# Patient Record
Sex: Male | Born: 1945 | Race: White | Hispanic: No | Marital: Married | Smoking: Former smoker
Health system: Southern US, Community
[De-identification: ages and names within clinical notes are randomized; demographics above are authoritative.]

## PROBLEM LIST (undated history)

## (undated) DIAGNOSIS — R51 Headache: Secondary | ICD-10-CM

## (undated) DIAGNOSIS — J189 Pneumonia, unspecified organism: Secondary | ICD-10-CM

## (undated) DIAGNOSIS — I10 Essential (primary) hypertension: Secondary | ICD-10-CM

## (undated) DIAGNOSIS — R0683 Snoring: Principal | ICD-10-CM

## (undated) DIAGNOSIS — M1990 Unspecified osteoarthritis, unspecified site: Secondary | ICD-10-CM

## (undated) DIAGNOSIS — K219 Gastro-esophageal reflux disease without esophagitis: Secondary | ICD-10-CM

## (undated) DIAGNOSIS — M549 Dorsalgia, unspecified: Secondary | ICD-10-CM

## (undated) DIAGNOSIS — R52 Pain, unspecified: Secondary | ICD-10-CM

## (undated) DIAGNOSIS — R519 Headache, unspecified: Secondary | ICD-10-CM

## (undated) DIAGNOSIS — I4891 Unspecified atrial fibrillation: Secondary | ICD-10-CM

## (undated) DIAGNOSIS — L409 Psoriasis, unspecified: Secondary | ICD-10-CM

## (undated) DIAGNOSIS — R0600 Dyspnea, unspecified: Secondary | ICD-10-CM

## (undated) DIAGNOSIS — I499 Cardiac arrhythmia, unspecified: Secondary | ICD-10-CM

## (undated) HISTORY — PX: CATARACT EXTRACTION: SUR2

## (undated) HISTORY — PX: TONSILLECTOMY: SUR1361

## (undated) HISTORY — DX: Dorsalgia, unspecified: M54.9

## (undated) HISTORY — DX: Snoring: R06.83

---

## 2006-07-06 HISTORY — PX: OTHER SURGICAL HISTORY: SHX169

## 2006-08-02 ENCOUNTER — Ambulatory Visit (HOSPITAL_COMMUNITY): Admission: RE | Admit: 2006-08-02 | Discharge: 2006-08-02 | Payer: Self-pay | Admitting: General Surgery

## 2006-08-02 ENCOUNTER — Encounter (INDEPENDENT_AMBULATORY_CARE_PROVIDER_SITE_OTHER): Payer: Self-pay | Admitting: Specialist

## 2007-08-15 ENCOUNTER — Encounter: Admission: RE | Admit: 2007-08-15 | Discharge: 2007-08-15 | Payer: Self-pay | Admitting: Internal Medicine

## 2010-11-21 NOTE — Op Note (Signed)
NAMEKOURY, RODDY              ACCOUNT NO.:  [id number]   MEDICAL RECORD NO.:  [id number]          PATIENT TYPE:  AMB   LOCATION:  SDS                          FACILITY:  [facility]   PHYSICIAN:  Angelia Mould. Derrell Lolling, M.D.DATE OF BIRTH:  04/10/1946   DATE OF PROCEDURE:  08/02/2006  DATE OF DISCHARGE:                               OPERATIVE REPORT   PREOPERATIVE DIAGNOSIS:  Right breast mass.   POSTOPERATIVE DIAGNOSIS:  Right breast mass.   OPERATION PERFORMED:  Excisional biopsy of right breast mass.   SURGEON:  Angelia Mould. Derrell Lolling, M.D.   ESTIMATED BLOOD LOSS:  About 10 mL.   COMPLICATIONS:  None.   COUNTS:  Sponge, needle and instrument counts were correct.   OPERATIVE INDICATIONS:  This is a 65 year old white man, who recently  noticed some pain above his right nipple and felt a lump.  There have  never been trauma or an infection or a problem there before.  Ultrasound  of this area was performed at [facility] and suggested an 8  x 16-mm lipoma.  On exam, he has got 1 small 1.5-cm soft tissue mass  just outside the areolar margin of the right breast at the 11-o'clock  position.  There are no adenopathy or skin change.  He is brought to the  minor procedure area for excision.   OPERATIVE TECHNIQUE:  The patient was brought to the minor procedure at  the [facility] Short Stay center.  He as placed supine on the operating table.  Time-out was performed to confirm the correct patient, correct procedure  and correct site.  The right breast was then prepped and draped in a  sterile fashion.  Xylocaine 1% with epinephrine was used as a local  infiltration anesthetic.  A curved circumareolar incision was made in  the superolateral aspect just outside the areolar margin.  Dissection  was carried down into the subcutaneous tissue and the breast tissue,  where I incised a firm nodule, which was smooth and was consistent with  a cyst or a fibrotic lipoma.  This was sent for routine  histology.  Hemostasis was excellent was achieved with Surgicel gauze and pressure.  I removed the Surgicel gauze after 5 minutes.  I closed the deeper  subcutaneous tissue with interrupted sutures of 3-0 Vicryl, and the skin  was closed with a running, subcuticular suture of 4-0 Monocryl and Steri-  Strips.  Clean bandages were placed.  The patient was taken back to the  waiting room in excellent condition.  Estimated blood loss was about 10  mL.     Angelia Mould. Derrell Lolling, M.D.  Electronically Signed    HMI/MEDQ  D:  08/02/2006  T:  08/02/2006  Job:  [id number]   cc:   Areatha Keas, M.D.

## 2011-01-27 ENCOUNTER — Other Ambulatory Visit: Payer: Self-pay | Admitting: Physician Assistant

## 2012-07-14 ENCOUNTER — Other Ambulatory Visit: Payer: Self-pay | Admitting: Orthopedic Surgery

## 2012-07-14 DIAGNOSIS — M25619 Stiffness of unspecified shoulder, not elsewhere classified: Secondary | ICD-10-CM

## 2012-07-14 DIAGNOSIS — M25512 Pain in left shoulder: Secondary | ICD-10-CM

## 2012-07-20 ENCOUNTER — Other Ambulatory Visit: Payer: Self-pay

## 2012-07-27 ENCOUNTER — Other Ambulatory Visit: Payer: Self-pay

## 2013-01-28 ENCOUNTER — Ambulatory Visit
Admission: RE | Admit: 2013-01-28 | Discharge: 2013-01-28 | Disposition: A | Discharge status: Home / Self Care | Payer: 59 | Source: Ambulatory Visit | Attending: Orthopedic Surgery | Admitting: Orthopedic Surgery

## 2013-01-28 DIAGNOSIS — M25619 Stiffness of unspecified shoulder, not elsewhere classified: Secondary | ICD-10-CM

## 2013-01-28 DIAGNOSIS — M25512 Pain in left shoulder: Secondary | ICD-10-CM

## 2013-02-01 ENCOUNTER — Other Ambulatory Visit: Payer: Self-pay

## 2013-06-06 DIAGNOSIS — M47817 Spondylosis without myelopathy or radiculopathy, lumbosacral region: Secondary | ICD-10-CM | POA: Insufficient documentation

## 2014-01-15 ENCOUNTER — Encounter (HOSPITAL_COMMUNITY): Payer: Self-pay | Admitting: Pharmacy Technician

## 2014-01-15 NOTE — Progress Notes (Signed)
Arlee Muslim, PA  -  Please enter preop orders in Epic for Ean Gettel - he is coming to [facility] on 7/23 for preop.  Thanks.

## 2014-01-18 ENCOUNTER — Other Ambulatory Visit: Payer: Self-pay | Admitting: Orthopedic Surgery

## 2014-01-25 ENCOUNTER — Encounter (HOSPITAL_COMMUNITY): Payer: Self-pay

## 2014-01-25 ENCOUNTER — Ambulatory Visit (HOSPITAL_COMMUNITY)
Admission: RE | Admit: 2014-01-25 | Discharge: 2014-01-25 | Disposition: A | Discharge status: Home / Self Care | Payer: Worker's Compensation | Source: Ambulatory Visit | Attending: Anesthesiology | Admitting: Anesthesiology

## 2014-01-25 ENCOUNTER — Encounter (HOSPITAL_COMMUNITY)
Admission: RE | Admit: 2014-01-25 | Discharge: 2014-01-25 | Disposition: A | Discharge status: Home / Self Care | Payer: Worker's Compensation | Source: Ambulatory Visit | Attending: Orthopedic Surgery | Admitting: Orthopedic Surgery

## 2014-01-25 DIAGNOSIS — Z01818 Encounter for other preprocedural examination: Secondary | ICD-10-CM | POA: Diagnosis present

## 2014-01-25 HISTORY — DX: Unspecified osteoarthritis, unspecified site: M19.90

## 2014-01-25 HISTORY — DX: Psoriasis, unspecified: L40.9

## 2014-01-25 HISTORY — DX: Pain, unspecified: R52

## 2014-01-25 HISTORY — DX: Gastro-esophageal reflux disease without esophagitis: K21.9

## 2014-01-25 HISTORY — DX: Essential (primary) hypertension: I10

## 2014-01-25 HISTORY — DX: Pneumonia, unspecified organism: J18.9

## 2014-01-25 NOTE — Pre-Procedure Instructions (Signed)
EKG REPORT 09/05/13 AND CBC, DIFF, CMET, LIPID PANEL, AND OTHER LABS DONE 01/24/14 ON PT'S CHART FROM DR. RAMACHANDRAN CXR WAS DONE TODAY PREOP AT [facility].

## 2014-01-25 NOTE — Patient Instructions (Signed)
YOUR SURGERY IS SCHEDULED AT [facility]  ON:  Wednesday  7/29  REPORT TO  SHORT STAY CENTER AT:  9:15 AM   PLEASE COME IN THE [facility] MAIN HOSPITAL ENTRANCE AND FOLLOW SIGNS TO SHORT STAY CENTER.  DO NOT EAT OR DRINK ANYTHING AFTER MIDNIGHT THE NIGHT BEFORE YOUR SURGERY.  YOU MAY BRUSH YOUR TEETH, RINSE OUT YOUR MOUTH--BUT NO WATER, NO FOOD, NO CHEWING GUM, NO MINTS, NO CANDIES, NO CHEWING TOBACCO.  PLEASE TAKE THE FOLLOWING MEDICATIONS THE AM OF YOUR SURGERY WITH A FEW SIPS OF WATER:  HYDROCODONE/ACETAMINOPHEN, PLAQUENIL, OMEPRAZOLE   DO NOT BRING VALUABLES, MONEY, CREDIT CARDS.  DO NOT WEAR JEWELRY, MAKE-UP, NAIL POLISH AND NO METAL PINS OR CLIPS IN YOUR HAIR. CONTACT LENS, DENTURES / PARTIALS, GLASSES SHOULD NOT BE WORN TO SURGERY AND IN MOST CASES-HEARING AIDS WILL NEED TO BE REMOVED.  BRING YOUR GLASSES CASE, ANY EQUIPMENT NEEDED FOR YOUR CONTACT LENS. FOR PATIENTS ADMITTED TO THE HOSPITAL--CHECK OUT TIME THE DAY OF DISCHARGE IS 11:00 AM.  ALL INPATIENT ROOMS ARE PRIVATE - WITH BATHROOM, TELEPHONE, TELEVISION AND WIFI INTERNET.  IF YOU ARE BEING DISCHARGED THE SAME DAY OF YOUR SURGERY--YOU CAN NOT DRIVE YOURSELF HOME--AND SHOULD NOT GO HOME ALONE BY TAXI OR BUS.  NO DRIVING OR OPERATING MACHINERY, OR MAKING LEGAL DECISIONS FOR 24 HOURS FOLLOWING ANESTHESIA / PAIN MEDICATIONS.  PLEASE MAKE ARRANGEMENTS FOR SOMEONE TO BE WITH YOU AT HOME THE FIRST 24 HOURS AFTER SURGERY. RESPONSIBLE DRIVER'S NAME / PHONE  PT'S WIFE MVHQ  [phone]                                                   XLKGMW NUUV OVER ANY  FACT SHEETS THAT YOU WERE GIVEN: MRSA INFORMATION, BLOOD TRANSFUSION INFORMATION, INCENTIVE SPIROMETER INFORMATION.  PLEASE BE AWARE THAT YOU MAY NEED ADDITIONAL BLOOD DRAWN DAY OF YOUR SURGERY  _______________________________________________________________________   [facility] - Preparing for Surgery Before surgery, you can play an important role.  Because skin is  not sterile, your skin needs to be as free of germs as possible.  You can reduce the number of germs on your skin by washing with CHG (chlorahexidine gluconate) soap before surgery.  CHG is an antiseptic cleaner which kills germs and bonds with the skin to continue killing germs even after washing. Please DO NOT use if you have an allergy to CHG or antibacterial soaps.  If your skin becomes reddened/irritated stop using the CHG and inform your nurse when you arrive at Short Stay. Do not shave (including legs and underarms) for at least 48 hours prior to the first CHG shower.  You may shave your face/neck. Please follow these instructions carefully:  1.  Shower with CHG Soap the night before surgery and the  morning of Surgery.  2.  If you choose to wash your hair, wash your hair first as usual with your  normal  shampoo.  3.  After you shampoo, rinse your hair and body thoroughly to remove the  shampoo.                           4.  Use CHG as you would any other liquid soap.  You can apply chg directly  to the skin and wash  Gently with a scrungie or clean washcloth.  5.  Apply the CHG Soap to your body ONLY FROM THE NECK DOWN.   Do not use on face/ open                           Wound or open sores. Avoid contact with eyes, ears mouth and genitals (private parts).                       Wash face,  Genitals (private parts) with your normal soap.             6.  Wash thoroughly, paying special attention to the area where your surgery  will be performed.  7.  Thoroughly rinse your body with warm water from the neck down.  8.  DO NOT shower/wash with your normal soap after using and rinsing off  the CHG Soap.                9.  Pat yourself dry with a clean towel.            10.  Wear clean pajamas.            11.  Place clean sheets on your bed the night of your first shower and do not  sleep with pets. Day of Surgery : Do not apply any lotions/deodorants the morning of surgery.   Please wear clean clothes to the hospital/surgery center.  FAILURE TO FOLLOW THESE INSTRUCTIONS MAY RESULT IN THE CANCELLATION OF YOUR SURGERY PATIENT SIGNATURE_________________________________  NURSE SIGNATURE__________________________________  ________________________________________________________________________   Adam Phenix  An incentive spirometer is a tool that can help keep your lungs clear and active. This tool measures how well you are filling your lungs with each breath. Taking long deep breaths may help reverse or decrease the chance of developing breathing (pulmonary) problems (especially infection) following:  A long period of time when you are unable to move or be active. BEFORE THE PROCEDURE   If the spirometer includes an indicator to show your best effort, your nurse or respiratory therapist will set it to a desired goal.  If possible, sit up straight or lean slightly forward. Try not to slouch.  Hold the incentive spirometer in an upright position. INSTRUCTIONS FOR USE  1. Sit on the edge of your bed if possible, or sit up as far as you can in bed or on a chair. 2. Hold the incentive spirometer in an upright position. 3. Breathe out normally. 4. Place the mouthpiece in your mouth and seal your lips tightly around it. 5. Breathe in slowly and as deeply as possible, raising the piston or the ball toward the top of the column. 6. Hold your breath for 3-5 seconds or for as long as possible. Allow the piston or ball to fall to the bottom of the column. 7. Remove the mouthpiece from your mouth and breathe out normally. 8. Rest for a few seconds and repeat Steps 1 through 7 at least 10 times every 1-2 hours when you are awake. Take your time and take a few normal breaths between deep breaths. 9. The spirometer may include an indicator to show your best effort. Use the indicator as a goal to work toward during each repetition. 10. After each set of 10 deep  breaths, practice coughing to be sure your lungs are clear. If you have an incision (the cut made at the time of  surgery), support your incision when coughing by placing a pillow or rolled up towels firmly against it. Once you are able to get out of bed, walk around indoors and cough well. You may stop using the incentive spirometer when instructed by your caregiver.  RISKS AND COMPLICATIONS  Take your time so you do not get dizzy or light-headed.  If you are in pain, you may need to take or ask for pain medication before doing incentive spirometry. It is harder to take a deep breath if you are having pain. AFTER USE  Rest and breathe slowly and easily.  It can be helpful to keep track of a log of your progress. Your caregiver can provide you with a simple table to help with this. If you are using the spirometer at home, follow these instructions: [place] IF:   You are having difficultly using the spirometer.  You have trouble using the spirometer as often as instructed.  Your pain medication is not giving enough relief while using the spirometer.  You develop fever of 100.5 F (38.1 C) or higher. SEEK IMMEDIATE MEDICAL CARE IF:   You cough up bloody sputum that had not been present before.  You develop fever of 102 F (38.9 C) or greater.  You develop worsening pain at or near the incision site. MAKE SURE YOU:   Understand these instructions.  Will watch your condition.  Will get help right away if you are not doing well or get worse. Document Released: 11/02/2006 Document Revised: 09/14/2011 Document Reviewed: 01/03/2007 [facility] Patient Information 2014 [place], [place].   ________________________________________________________________________

## 2014-01-25 NOTE — Progress Notes (Signed)
01/25/14 1300  OBSTRUCTIVE SLEEP APNEA  Have you ever been diagnosed with sleep apnea through a sleep study? No  Do you snore loudly (loud enough to be heard through closed doors)?  0  Do you often feel tired, fatigued, or sleepy during the daytime? 0  Has anyone observed you stop breathing during your sleep? 0  Do you have, or are you being treated for high blood pressure? 1  BMI more than 35 kg/m2? 0  Age over 66 years old? 1  Neck circumference greater than 40 cm/16 inches? 1  Gender: 1  Obstructive Sleep Apnea Score 4  Score 4 or greater  Results sent to PCP

## 2014-01-30 DIAGNOSIS — S83249A Other tear of medial meniscus, current injury, unspecified knee, initial encounter: Secondary | ICD-10-CM

## 2014-01-30 NOTE — H&P (Signed)
CC- Richard [name] is a 68 y.o. male who presents with right knee pain.  HPI- . Knee Pain: Patient presents with knee pain involving the  right knee. Onset of the symptoms was several years ago. Inciting event: injury at work. Current symptoms include giving out and locking. Pain is aggravated by lateral movements, pivoting, rising after sitting and walking.  Patient has had no prior knee problems. Evaluation to date: MRI: abnormal medial meniscal tear. Treatment to date: corticosteroid injection which was not very effective and rest.  Past Medical History  Diagnosis Date  . GERD (gastroesophageal reflux disease)   . Pain     PAIN AND OA RT KNEE  . Psoriasis   . Hypertension   . Pneumonia     YEARS AGO  . 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 Lump removed from left breast      [place]- NOT CANCER 5 OR MORE YRS AGO  . Tonsillectomy      AS A CHILD  . Morton's neuroma removed      Prior to Admission medications   Medication Sig Start Date End Date Taking? Authorizing Provider  CALCIUM-MAGNESIUM-ZINC PO Take 1 tablet by mouth 2 (two) times daily.     Historical Provider, MD  cholecalciferol (VITAMIN D) 1000 UNITS tablet Take 2,000 Units by mouth daily.    Historical Provider, MD  Clobetasol Propionate (TEMOVATE) 0.05 % external spray Apply 1 application topically daily. Knees    Historical Provider, MD  cyclobenzaprine (FLEXERIL) 10 MG tablet Take 10 mg by mouth 3 (three) times daily as needed for muscle spasms (BACK).     Historical Provider, MD  Glucosamine-Chondroit-Vit C-Mn (GLUCOSAMINE CHONDR 1500 COMPLX PO) Take 1 tablet by mouth 2 (two) times daily.    Historical Provider, MD  HYDROcodone-acetaminophen (NORCO/VICODIN) 5-325 MG per tablet Take 1 tablet by mouth 3 (three) times daily. 6 AM AND 12NOON AND 6 PM    Historical Provider, MD  hydroxychloroquine (PLAQUENIL) 200 MG tablet  Take 200 mg by mouth 2 (two) times daily.    Historical Provider, MD  lisinopril-hydrochlorothiazide (PRINZIDE,ZESTORETIC) 10-12.5 MG per tablet Take 1 tablet by mouth every morning.    Historical Provider, MD  misoprostol (CYTOTEC) 100 MCG tablet Take 100 mcg by mouth 2 (two) times daily.    Historical Provider, MD  Multiple Vitamin (MULTIVITAMIN WITH MINERALS) TABS tablet Take 1 tablet by mouth daily.    Historical Provider, MD  Naftifine HCl (NAFTIN) 2 % CREA Apply 1 application topically at bedtime. Applied around [place] Provider, MD  Omega-3 Fatty Acids (FISH OIL) 1000 MG CAPS Take 1,000 mg by mouth 2 (two) times daily.    Historical Provider, MD  omeprazole (PRILOSEC) 20 MG capsule Take 20 mg by mouth every morning.    Historical Provider, MD  OVER THE COUNTER MEDICATION Take 1 tablet by mouth 2 (two) times daily. Bilberry with lutein    Historical Provider, MD  OVER THE COUNTER MEDICATION     Historical Provider, MD  sulindac (CLINORIL) 200 MG tablet Take 200 mg by mouth 2 (two) times daily.    Historical Provider, MD  vitamin B-12 (CYANOCOBALAMIN) 1000 MCG tablet Take 1,000 mcg by mouth daily.    Historical Provider, MD   Right knee exam antalgic gait, soft tissue tenderness over medial joint line, effusion, negative pivot-shift,  collateral ligaments intact  Physical Examination: General appearance - alert, well appearing, and in no distress Mental status - alert, oriented to person, place, and time Chest - clear to auscultation, no wheezes, rales or rhonchi, symmetric air entry Heart - normal rate, regular rhythm, normal S1, S2, no murmurs, rubs, clicks or gallops Abdomen - soft, nontender, nondistended, no masses or organomegaly Neurological - alert, oriented, normal speech, no focal findings or movement disorder noted   Asessment/Plan--- Right knee medial meniscal tear- - Plan right knee arthroscopy with meniscal debridement. Procedure risks and potential comps  discussed with patient who elects to proceed. Goals are decreased pain and increased function with a high likelihood of achieving both

## 2014-01-31 ENCOUNTER — Encounter (HOSPITAL_COMMUNITY): Payer: Self-pay | Admitting: *Deleted

## 2014-01-31 ENCOUNTER — Ambulatory Visit (HOSPITAL_COMMUNITY)
Admission: RE | Admit: 2014-01-31 | Discharge: 2014-01-31 | Disposition: A | Discharge status: Home / Self Care | Payer: Worker's Compensation | Source: Ambulatory Visit | Attending: Orthopedic Surgery | Admitting: Orthopedic Surgery

## 2014-01-31 ENCOUNTER — Ambulatory Visit (HOSPITAL_COMMUNITY): Payer: Worker's Compensation | Admitting: Anesthesiology

## 2014-01-31 ENCOUNTER — Encounter (HOSPITAL_COMMUNITY)
Admission: RE | Disposition: A | Discharge status: Home / Self Care | Payer: Self-pay | Source: Ambulatory Visit | Attending: Orthopedic Surgery

## 2014-01-31 ENCOUNTER — Encounter (HOSPITAL_COMMUNITY): Payer: Worker's Compensation | Admitting: Anesthesiology

## 2014-01-31 DIAGNOSIS — S83241D Other tear of medial meniscus, current injury, right knee, subsequent encounter: Secondary | ICD-10-CM

## 2014-01-31 DIAGNOSIS — Z87891 Personal history of nicotine dependence: Secondary | ICD-10-CM | POA: Insufficient documentation

## 2014-01-31 DIAGNOSIS — M2240 Chondromalacia patellae, unspecified knee: Secondary | ICD-10-CM | POA: Insufficient documentation

## 2014-01-31 DIAGNOSIS — K219 Gastro-esophageal reflux disease without esophagitis: Secondary | ICD-10-CM | POA: Insufficient documentation

## 2014-01-31 DIAGNOSIS — M23305 Other meniscus derangements, unspecified medial meniscus, unspecified knee: Secondary | ICD-10-CM | POA: Insufficient documentation

## 2014-01-31 DIAGNOSIS — M23329 Other meniscus derangements, posterior horn of medial meniscus, unspecified knee: Secondary | ICD-10-CM | POA: Insufficient documentation

## 2014-01-31 DIAGNOSIS — Z79899 Other long term (current) drug therapy: Secondary | ICD-10-CM | POA: Insufficient documentation

## 2014-01-31 DIAGNOSIS — S83249A Other tear of medial meniscus, current injury, unspecified knee, initial encounter: Secondary | ICD-10-CM

## 2014-01-31 DIAGNOSIS — I10 Essential (primary) hypertension: Secondary | ICD-10-CM | POA: Insufficient documentation

## 2014-01-31 HISTORY — PX: KNEE ARTHROSCOPY: SHX127

## 2014-01-31 SURGERY — ARTHROSCOPY, KNEE
Anesthesia: General | Site: Knee | Laterality: Right

## 2014-01-31 MED ORDER — MIDAZOLAM HCL 2 MG/2ML IJ SOLN
INTRAMUSCULAR | Status: AC
  Filled 2014-01-31: qty 2

## 2014-01-31 MED ORDER — PROPOFOL 10 MG/ML IV BOLUS
INTRAVENOUS | Status: DC | PRN
  Administered 2014-01-31: 200 mg via INTRAVENOUS

## 2014-01-31 MED ORDER — ONDANSETRON HCL 4 MG/2ML IJ SOLN
INTRAMUSCULAR | Status: AC
  Filled 2014-01-31: qty 2

## 2014-01-31 MED ORDER — FENTANYL CITRATE 0.05 MG/ML IJ SOLN
INTRAMUSCULAR | Status: AC
  Filled 2014-01-31: qty 2

## 2014-01-31 MED ORDER — LACTATED RINGERS IR SOLN
Status: DC | PRN
  Administered 2014-01-31: 6000 mL

## 2014-01-31 MED ORDER — CEFAZOLIN SODIUM-DEXTROSE 2-3 GM-% IV SOLR
2.0000 g | INTRAVENOUS | Status: AC
  Administered 2014-01-31: 2 g via INTRAVENOUS

## 2014-01-31 MED ORDER — CEFAZOLIN SODIUM-DEXTROSE 2-3 GM-% IV SOLR
INTRAVENOUS | Status: AC
  Filled 2014-01-31: qty 50

## 2014-01-31 MED ORDER — DEXAMETHASONE SODIUM PHOSPHATE 10 MG/ML IJ SOLN: 10.0000 mg | Freq: Once | INTRAMUSCULAR | Status: DC

## 2014-01-31 MED ORDER — SODIUM CHLORIDE 0.9 % IV SOLN: INTRAVENOUS | Status: DC

## 2014-01-31 MED ORDER — FENTANYL CITRATE 0.05 MG/ML IJ SOLN
INTRAMUSCULAR | Status: DC | PRN
  Administered 2014-01-31 (×3): 50 ug via INTRAVENOUS

## 2014-01-31 MED ORDER — METHOCARBAMOL 500 MG PO TABS: 500.0000 mg | ORAL_TABLET | Freq: Four times a day (QID) | ORAL | Status: DC

## 2014-01-31 MED ORDER — KETOROLAC TROMETHAMINE 30 MG/ML IJ SOLN: 15.0000 mg | Freq: Once | INTRAMUSCULAR | Status: DC | PRN

## 2014-01-31 MED ORDER — BUPIVACAINE-EPINEPHRINE (PF) 0.25% -1:200000 IJ SOLN
INTRAMUSCULAR | Status: AC
Start: 2014-01-31 — End: 2014-01-31
  Filled 2014-01-31: qty 30

## 2014-01-31 MED ORDER — PROPOFOL 10 MG/ML IV BOLUS
INTRAVENOUS | Status: AC
  Filled 2014-01-31: qty 20

## 2014-01-31 MED ORDER — FENTANYL CITRATE 0.05 MG/ML IJ SOLN
25.0000 ug | INTRAMUSCULAR | Status: DC | PRN
  Administered 2014-01-31 (×2): 25 ug via INTRAVENOUS

## 2014-01-31 MED ORDER — LACTATED RINGERS IV SOLN
INTRAVENOUS | Status: DC
  Administered 2014-01-31: 12:00:00 via INTRAVENOUS
  Administered 2014-01-31: 1000 mL via INTRAVENOUS

## 2014-01-31 MED ORDER — LIDOCAINE HCL (CARDIAC) 20 MG/ML IV SOLN
INTRAVENOUS | Status: DC | PRN
  Administered 2014-01-31: 100 mg via INTRAVENOUS

## 2014-01-31 MED ORDER — PROMETHAZINE HCL 25 MG/ML IJ SOLN
6.2500 mg | INTRAMUSCULAR | Status: DC | PRN
Start: 2014-01-31 — End: 2014-01-31

## 2014-01-31 MED ORDER — ACETAMINOPHEN 10 MG/ML IV SOLN
1000.0000 mg | Freq: Once | INTRAVENOUS | Status: AC
  Administered 2014-01-31: 1000 mg via INTRAVENOUS
  Filled 2014-01-31: qty 100

## 2014-01-31 MED ORDER — BUPIVACAINE-EPINEPHRINE 0.25% -1:200000 IJ SOLN
INTRAMUSCULAR | Status: DC | PRN
  Administered 2014-01-31: 20 mL

## 2014-01-31 MED ORDER — HYDROCODONE-ACETAMINOPHEN 5-325 MG PO TABS: 1.0000 | ORAL_TABLET | ORAL | Status: DC | PRN

## 2014-01-31 MED ORDER — KETOROLAC TROMETHAMINE 30 MG/ML IJ SOLN
INTRAMUSCULAR | Status: AC
  Filled 2014-01-31: qty 1

## 2014-01-31 MED ORDER — LIDOCAINE HCL (CARDIAC) 20 MG/ML IV SOLN
INTRAVENOUS | Status: AC
  Filled 2014-01-31: qty 5

## 2014-01-31 MED ORDER — ONDANSETRON HCL 4 MG/2ML IJ SOLN
INTRAMUSCULAR | Status: DC | PRN
  Administered 2014-01-31: 4 mg via INTRAVENOUS

## 2014-01-31 MED ORDER — MIDAZOLAM HCL 5 MG/5ML IJ SOLN
INTRAMUSCULAR | Status: DC | PRN
  Administered 2014-01-31: 2 mg via INTRAVENOUS

## 2014-01-31 MED ORDER — CHLORHEXIDINE GLUCONATE 4 % EX LIQD: 60.0000 mL | Freq: Once | CUTANEOUS | Status: DC

## 2014-01-31 SURGICAL SUPPLY — 28 items
BANDAGE ELASTIC 6 VELCRO ST LF (GAUZE/BANDAGES/DRESSINGS) ×1 IMPLANT
BLADE 4.2CUDA (BLADE) ×2 IMPLANT
BNDG CMPR 82X61 PLY HI ABS (GAUZE/BANDAGES/DRESSINGS) ×1
BNDG CONFORM 6X.82 1P STRL (GAUZE/BANDAGES/DRESSINGS) ×1 IMPLANT
CLOTH BEACON ORANGE TIMEOUT ST (SAFETY) ×2 IMPLANT
COUNTER NEEDLE 20 DBL MAG RED (NEEDLE) ×2 IMPLANT
CUFF TOURN SGL QUICK 34 (TOURNIQUET CUFF) ×2
CUFF TRNQT CYL 34X4X40X1 (TOURNIQUET CUFF) ×1 IMPLANT
DRAPE U-SHAPE 47X51 STRL (DRAPES) ×2 IMPLANT
DRSG EMULSION OIL 3X3 NADH (GAUZE/BANDAGES/DRESSINGS) ×2 IMPLANT
DURAPREP 26ML APPLICATOR (WOUND CARE) ×2 IMPLANT
GAUZE SPONGE 2X2 8PLY STRL LF (GAUZE/BANDAGES/DRESSINGS) IMPLANT
GLOVE BIO SURGEON STRL SZ8 (GLOVE) ×2 IMPLANT
GLOVE BIOGEL PI IND STRL 8 (GLOVE) ×1 IMPLANT
GLOVE BIOGEL PI INDICATOR 8 (GLOVE) ×1
GOWN STRL REUS W/TWL LRG LVL3 (GOWN DISPOSABLE) ×2 IMPLANT
MANIFOLD NEPTUNE II (INSTRUMENTS) ×2 IMPLANT
PACK ARTHROSCOPY WL (CUSTOM PROCEDURE TRAY) ×2 IMPLANT
PACK ICE MAXI GEL EZY WRAP (MISCELLANEOUS) ×6 IMPLANT
PADDING CAST COTTON 6X4 STRL (CAST SUPPLIES) ×4 IMPLANT
POSITIONER SURGICAL ARM (MISCELLANEOUS) ×2 IMPLANT
SET ARTHROSCOPY TUBING (MISCELLANEOUS) ×2
SET ARTHROSCOPY TUBING LN (MISCELLANEOUS) ×1 IMPLANT
SPONGE GAUZE 2X2 STER 10/PKG (GAUZE/BANDAGES/DRESSINGS) ×1
SUT ETHILON 4 0 PS 2 18 (SUTURE) ×2 IMPLANT
TOWEL OR 17X26 10 PK STRL BLUE (TOWEL DISPOSABLE) ×2 IMPLANT
WAND 90 DEG TURBOVAC W/CORD (SURGICAL WAND) IMPLANT
WRAP KNEE MAXI GEL POST OP (GAUZE/BANDAGES/DRESSINGS) ×2 IMPLANT

## 2014-01-31 NOTE — Transfer of Care (Signed)
Immediate Anesthesia Transfer of Care Note  Patient: Richard [name]  Procedure(s) Performed: Procedure(s): RIGHT ARTHROSCOPY KNEE WITH DEBRIDMENT CHONDROPLASTY (Right)  Patient Location: PACU  Anesthesia Type:General  Level of Consciousness: sedated  Airway & Oxygen Therapy: Patient Spontanous Breathing and Patient connected to face mask oxygen  Post-op Assessment: Report given to PACU RN and Post -op Vital signs reviewed and stable  Post vital signs: Reviewed and stable  Complications: No apparent anesthesia complications

## 2014-01-31 NOTE — Anesthesia Preprocedure Evaluation (Signed)
Anesthesia Evaluation  Patient identified by MRN, date of birth, ID band Patient awake    Reviewed: Allergy & Precautions, H&P , NPO status , Patient's Chart, lab work & pertinent test results  Airway Mallampati: II TM Distance: >3 FB Neck ROM: Full    Dental no notable dental hx.    Pulmonary neg pulmonary ROS, former smoker,  breath sounds clear to auscultation  Pulmonary exam normal       Cardiovascular hypertension, Pt. on medications Rhythm:Regular Rate:Normal     Neuro/Psych negative neurological ROS  negative psych ROS   GI/Hepatic Neg liver ROS, GERD-  Medicated,  Endo/Other  negative endocrine ROS  Renal/GU negative Renal ROS  negative genitourinary   Musculoskeletal negative musculoskeletal ROS (+)   Abdominal   Peds negative pediatric ROS (+)  Hematology negative hematology ROS (+)   Anesthesia Other Findings   Reproductive/Obstetrics negative OB ROS                           Anesthesia Physical Anesthesia Plan  ASA: II  Anesthesia Plan: General   Post-op Pain Management:    Induction: Intravenous  Airway Management Planned: LMA  Additional Equipment:   Intra-op Plan:   Post-operative Plan: Extubation in OR  Informed Consent: I have reviewed the patients History and Physical, chart, labs and discussed the procedure including the risks, benefits and alternatives for the proposed anesthesia with the patient or authorized representative who has indicated his/her understanding and acceptance.   Dental advisory given  Plan Discussed with: CRNA and Surgeon  Anesthesia Plan Comments:         Anesthesia Quick Evaluation

## 2014-01-31 NOTE — Discharge Instructions (Signed)

## 2014-01-31 NOTE — Interval H&P Note (Signed)
History and Physical Interval Note:  01/31/2014 11:18 AM  Richard [name]  has presented today for surgery, with the diagnosis of RIGHT KNEE MEDIAL MENISCAL TEAR  The various methods of treatment have been discussed with the patient and family. After consideration of risks, benefits and other options for treatment, the patient has consented to  Procedure(s): RIGHT ARTHROSCOPY KNEE WITH DEBRIDMENT (Right) as a surgical intervention .  The patient's history has been reviewed, patient examined, no change in status, stable for surgery.  I have reviewed the patient's chart and labs.  Questions were answered to the patient's satisfaction.     [name]

## 2014-01-31 NOTE — Op Note (Signed)
Preoperative diagnosis-  Right knee medial meniscal tear  Postoperative diagnosis Right- knee medial meniscal tear plus  Medial femoral chondral defect  Procedure- Right knee arthroscopy with medial meniscal debridement and chondroplasty   Surgeon- Dione Plover. Evagelia Knack, MD  Anesthesia-General  EBL-  Minimal  Complications- None  Condition- PACU - hemodynamically stable.  Brief clinical note- -Richard [name] is a 69 y.o.  male with a several year history of right knee pain and mechanical symptoms. Exam and history suggested medial meniscal tear confirmed by MRI. The patient presents now for arthroscopy and debridement   Procedure in detail -       After successful administration of General anesthetic, a tourmiquet is placed high on the Right  thigh and the Right lower extremity is prepped and draped in the usual sterile fashion. Time out is performed by the surgical team. Standard superomedial and inferolateral portal sites are marked and incisions made with an 11 blade. The inflow cannula is passed through the superomedial portal and camera through the inferolateral portal and inflow is initiated. Arthroscopic visualization proceeds.      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 The medial and lateral gutters are visualized and there are  no loose bodies. Flexion and valgus force is applied to the knee and the medial compartment is entered. A spinal needle is passed into the joint through the site marked for the inferomedial portal. A small incision is made and the dilator passed into the joint. The findings for the medial compartment are 2 x 2 cn unstable chondral lesion medial femoral condyle and unstable tear posterior horn medial meniscus . The tear is debrided to a stable base with baskets and a shaver and sealed off with the Arthrocare. The shaver is used to debride the unstable cartilage to a stable  cartilaginous base with stable edges. It is probed and found to be stable.    The intercondylar notch is visualized and the ACL appears normal. The lateral compartment is entered and the findings are normal .      The joint is again inspected and there are no other tears, defects or loose bodies identified. The arthroscopic equipment is then removed from the inferior portals which are closed with interrupted 4-0 nylon. 20 ml of .25% Marcaine with epinephrine are injected through the inflow cannula and the cannula is then removed and the portal closed with nylon. The incisions are cleaned and dried and a bulky sterile dressing is applied. The patient is then awakened and transported to recovery in stable condition.   01/31/2014, 11:55 AM

## 2014-01-31 NOTE — Anesthesia Postprocedure Evaluation (Signed)
  Anesthesia Post-op Note  Patient: Richard [name]  Procedure(s) Performed: Procedure(s) (LRB): RIGHT ARTHROSCOPY KNEE WITH DEBRIDMENT CHONDROPLASTY (Right)  Patient Location: PACU  Anesthesia Type: General  Level of Consciousness: awake and alert   Airway and Oxygen Therapy: Patient Spontanous Breathing  Post-op Pain: mild  Post-op Assessment: Post-op Vital signs reviewed, Patient's Cardiovascular Status Stable, Respiratory Function Stable, Patent Airway and No signs of Nausea or vomiting  Last Vitals:  Filed Vitals:   01/31/14 1251  BP:   Pulse:   Temp: 36.4 C  Resp:     Post-op Vital Signs: stable   Complications: No apparent anesthesia complications

## 2014-02-01 ENCOUNTER — Encounter (HOSPITAL_COMMUNITY): Payer: Self-pay | Admitting: Orthopedic Surgery

## 2014-04-19 ENCOUNTER — Encounter: Payer: Self-pay | Admitting: *Deleted

## 2014-04-27 ENCOUNTER — Ambulatory Visit (INDEPENDENT_AMBULATORY_CARE_PROVIDER_SITE_OTHER): Payer: Worker's Compensation | Admitting: Neurology

## 2014-04-27 ENCOUNTER — Encounter: Payer: Self-pay | Admitting: Neurology

## 2014-04-27 VITALS — BP 122/80 | HR 60 | Temp 98.1°F | Resp 12 | Ht 69.0 in | Wt 189.0 lb

## 2014-04-27 DIAGNOSIS — R0683 Snoring: Secondary | ICD-10-CM

## 2014-04-27 DIAGNOSIS — F1190 Opioid use, unspecified, uncomplicated: Secondary | ICD-10-CM

## 2014-04-27 HISTORY — DX: Snoring: R06.83

## 2014-04-27 NOTE — Progress Notes (Signed)
SLEEP MEDICINE CLINIC   Provider:  Larey Seat, M D  Referring Provider: Algis Greenhouse, MD Primary Care Physician:  Merrilee Seashore, MD  Chief Complaint  Patient presents with  . NP Sleep [place]    Rm 10, alone    HPI:  Richard [name] is a 68 y.o. male, who  is seen here as a referral from Dr. Brien Few / Dr. Ashby Dawes for a sleep consultation,  Mr. Richard [name] has reported regular sleep habits, going to bed at about 10.30 and falling asleep within 10 minutes. He takes pain medication , but only in AM and at noon, not at night time. He has not had an ONO before and this visit is through [employer]. His bedroom is cool, quiet and dark- and watches TV in the bedroom, he use a 20 minute sleep timer. He is sleeping in the same room with his wife, but no pets.  He has a parrots. He is having chronic lower back pain, and is doing well with his regimen, can function well. He cannot sleep on his right due to shoulder pain, and he sleeps now on his back , and snores. He has not woken himself from snoring. His wife reports him snoring loudly and louder after he begun pain medication.  No nocturia, no morning headaches .    Review of Systems: Out of a complete 14 system review, the patient complains of only the following symptoms, and all other reviewed systems are negative. Snoring only, back pain.   Epworth score 2 , Fatigue severity score 10  , depression score 1 His father is a loud snorer at 46 years of age .    History   Social History  . Marital Status: Married    Spouse Name: N/A    Number of Children: N/A  . Years of Education: N/A   Occupational History  . Not on file.   Social History Main Topics  . Smoking status: Former Smoker    Types: Cigarettes  . Smokeless tobacco: Not on file     Comment: quit around 2002  . Alcohol Use: No     Comment: QUIT SMOKING ABOUT 15 YRS AGO--WAS ONLY 2 OR 3 CIGARETTES WITH A BEER ON WEEK ENDS  . Drug Use: No  .  Sexual Activity: Not on file   Other Topics Concern  . Not on file   Social History Narrative   Right handed, caffeine 2-3 cups daily, married, no kids, has baby bird.  WC back injury, so not working at this time.  BS in econmics.    Family History  Problem Relation Age of Onset  . Diabetes Mother   . Hypertension Mother   . Stroke Father   . Hypertension Father     Past Medical History  Diagnosis Date  . GERD (gastroesophageal reflux disease)   . Pain     PAIN AND OA RT KNEE  . Psoriasis   . Hypertension   . Pneumonia     YEARS AGO  . 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 Back pain   . Snoring 04/27/2014    Past Surgical History  Procedure Laterality Date  . Lump removed from left breast Left 2008    [place]- NOT CANCER 5 OR MORE YRS AGO  . Tonsillectomy      AS A CHILD  . Morton's neuroma removed    . Knee arthroscopy  Right 01/31/2014    Procedure: RIGHT ARTHROSCOPY KNEE WITH DEBRIDMENT CHONDROPLASTY;  Surgeon: [name], MD;  Location: [facility];  Service: Orthopedics;  Laterality: Right;    Current Outpatient Prescriptions  Medication Sig Dispense Refill  . CALCIUM-MAGNESIUM-ZINC PO Take 1 tablet by mouth 2 (two) times daily.       . cholecalciferol (VITAMIN D) 1000 UNITS tablet Take 2,000 Units by mouth daily.      . cyclobenzaprine (FLEXERIL) 10 MG tablet Take 10 mg by mouth 3 (three) times daily as needed for muscle spasms (BACK).       Marland Kitchen desonide (DESOWEN) 0.05 % cream       . Glucosamine-Chondroit-Vit C-Mn (GLUCOSAMINE CHONDR 1500 COMPLX PO) Take 1 tablet by mouth 2 (two) times daily.      Marland Kitchen HYDROcodone-acetaminophen (NORCO/VICODIN) 5-325 MG per tablet Take 1-2 tablets by mouth every 4 (four) hours as needed for moderate pain. 6 AM AND 12NOON AND 6 PM  50 tablet  0  . hydroxychloroquine (PLAQUENIL) 200 MG tablet Take 200 mg by mouth 2 (two) times daily.      Marland Kitchen lisinopril-hydrochlorothiazide  (PRINZIDE,ZESTORETIC) 10-12.5 MG per tablet Take 1 tablet by mouth every morning.      . misoprostol (CYTOTEC) 100 MCG tablet Take 100 mcg by mouth 2 (two) times daily.      . Multiple Vitamin (MULTIVITAMIN WITH MINERALS) TABS tablet Take 1 tablet by mouth daily.      . Naftifine HCl (NAFTIN) 2 % CREA Apply 1 application topically at bedtime. Applied around [place]      . Omega-3 Fatty Acids (FISH OIL) 1000 MG CAPS Take 1,000 mg by mouth 2 (two) times daily.      Marland Kitchen omeprazole (PRILOSEC) 20 MG capsule Take 20 mg by mouth every morning.      Marland Kitchen OVER THE COUNTER MEDICATION Take 1 tablet by mouth 2 (two) times daily. Bilberry with lutein      . sulindac (CLINORIL) 200 MG tablet Take 200 mg by mouth 2 (two) times daily.      . vitamin B-12 (CYANOCOBALAMIN) 1000 MCG tablet Take 1,000 mcg by mouth daily.      . Clobetasol Propionate (TEMOVATE) 0.05 % external spray Apply 1 application topically daily. Knees      . hydrocortisone (ANUSOL-HC) 2.5 % rectal cream Place 1 application rectally as needed for hemorrhoids or itching.       No current facility-administered medications for this visit.    Allergies as of 04/27/2014 - Review Complete 04/27/2014  Allergen Reaction Noted  . Azithromycin Diarrhea and Nausea And Vomiting 01/31/2014  . Latex  01/25/2014  . Prednisone Other (See Comments) 01/31/2014    Vitals: BP 122/80  Pulse 60  Temp(Src) 98.1 F (36.7 C) (Oral)  Resp 12  Ht 5\' 9"  (1.753 m)  Wt 189 lb (85.73 kg)  BMI 27.90 kg/m2 Last Weight:  Wt Readings from Last 1 Encounters:  04/27/14 189 lb (85.73 kg)       Last Height:   Ht Readings from Last 1 Encounters:  04/27/14 5\' 9"  (1.753 m)    Physical exam:  General: The patient is awake, alert and appears not in acute distress. The patient is well groomed. Head: Normocephalic, atraumatic. Neck is supple. Mallampati 1   neck circumference: 14 .5. Nasal airflow unrestricted  TMJ is  Not  evident . Retrognathia is not noted.    Cardiovascular:  Regular rate and rhythm , without  murmurs or carotid bruit, and  without distended neck veins. Respiratory: Lungs are clear to auscultation. Skin:  Without evidence of edema, or rash Trunk: Neurologic exam : The patient is awake and alert, oriented to place and time.   Memory subjective   described as intact. There is a normal attention span & concentration ability. Speech is fluent without  dysarthria, dysphonia or aphasia.  Mood and affect are appropriate.  Cranial nerves: Pupils are equal and briskly reactive to light. Funduscopic exam without  evidence of pallor or edema. Extraocular movements  in vertical and horizontal planes intact and without nystagmus. Visual fields by finger perimetry are intact. Hearing to finger rub intact.  Facial sensation intact to fine touch. Facial motor strength is symmetric and tongue and uvula move midline.  Motor exam:  Normal tone, muscle bulk and symmetric, strength in all extremities.  Sensory:  Fine touch, pinprick and vibration were tested in all extremities.  Proprioception is tested  Normal. There is a radiculopathy at L4-5 , left leg .  Coordination: Rapid alternating movements in the fingers/hands is normal.  Finger-to-nose maneuver  normal without evidence of ataxia, dysmetria or tremor.  Gait and station: Patient walks without assistive device and is able unassisted to climb up to the exam table.  Strength within normal limits. Stance is stable and normal.  Deep tendon reflexes: in the  upper and lower extremities are symmetric and intact. Babinski  downgoing.   Assessment:  After physical and neurologic examination, review of laboratory studies, imaging, neurophysiology testing and pre-existing records, assessment is   The patient has a low risk for OSA by BMI and neck size and air way anatomy.      The patient was advised of the nature of the diagnosed sleep disorder , the treatment options and risks for general a  health and wellness arising from not treating the condition. Visit duration was 30 minutes.   Plan:  Treatment plan and additional workup : SPLIT by Workmen's comp.  35 and split at AHI 10, snoring , Co2 needed.   Asencion Partridge Shamecca Whitebread MD  04/27/2014

## 2014-06-05 DIAGNOSIS — F1120 Opioid dependence, uncomplicated: Secondary | ICD-10-CM | POA: Insufficient documentation

## 2014-10-30 NOTE — H&P (Signed)
Richard [name] is an 69 y.o. male.    Chief Complaint: right shoulder pain and weakness  HPI: Pt is a 69 y.o. male complaining of right shoulder pain for multiple months. Pain had continually increased since the beginning. X-rays in the clinic show rotator cuff tear right shoulder. Pt has tried various conservative treatments which have failed to alleviate their symptoms, including injections and therapy. Various options are discussed with the patient. Risks, benefits and expectations were discussed with the patient. Patient understand the risks, benefits and expectations and wishes to proceed with surgery.   PCP:  Merrilee Seashore, MD  D/C Plans: Home  PMH: Past Medical History  Diagnosis Date  . GERD (gastroesophageal reflux disease)   . Pain     PAIN AND OA RT KNEE  . Psoriasis   . Hypertension   . Pneumonia     YEARS AGO  . 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 Back pain   . Snoring 04/27/2014    PSH: Past Surgical History  Procedure Laterality Date  . Lump removed from left breast Left 2008    [place]- NOT CANCER 5 OR MORE YRS AGO  . Tonsillectomy      AS A CHILD  . Morton's neuroma removed    . Knee arthroscopy Right 01/31/2014    Procedure: RIGHT ARTHROSCOPY KNEE WITH DEBRIDMENT CHONDROPLASTY;  Surgeon: [name], MD;  Location: [facility];  Service: Orthopedics;  Laterality: Right;    Social History:  reports that he has quit smoking. His smoking use included Cigarettes. He does not have any smokeless tobacco history on file. He reports that he does not drink alcohol or use illicit drugs.  Allergies:  Allergies  Allergen Reactions  . Azithromycin Diarrhea and Nausea And Vomiting  . Latex     WHEN PT USED TO WEAR LATEX GLOVES AT WORK HE NOTICED SKIN REDNESS AND ITCHING OF HANDS.  Marland Kitchen Prednisone Other (See Comments)    "makes me angry and I feel weird."    Medications: No current facility-administered  medications for this encounter.   Current Outpatient Prescriptions  Medication Sig Dispense Refill  . CALCIUM-MAGNESIUM-ZINC PO Take 1 tablet by mouth 2 (two) times daily.     . cholecalciferol (VITAMIN D) 1000 UNITS tablet Take 2,000 Units by mouth daily.    . Clobetasol Propionate (TEMOVATE) 0.05 % external spray Apply 1 application topically daily. Knees    . cyclobenzaprine (FLEXERIL) 10 MG tablet Take 10 mg by mouth 3 (three) times daily as needed for muscle spasms (BACK).     Marland Kitchen desonide (DESOWEN) 0.05 % cream     . Glucosamine-Chondroit-Vit C-Mn (GLUCOSAMINE CHONDR 1500 COMPLX PO) Take 1 tablet by mouth 2 (two) times daily.    Marland Kitchen HYDROcodone-acetaminophen (NORCO/VICODIN) 5-325 MG per tablet Take 1-2 tablets by mouth every 4 (four) hours as needed for moderate pain. 6 AM AND 12NOON AND 6 PM 50 tablet 0  . hydrocortisone (ANUSOL-HC) 2.5 % rectal cream Place 1 application rectally as needed for hemorrhoids or itching.    . hydroxychloroquine (PLAQUENIL) 200 MG tablet Take 200 mg by mouth 2 (two) times daily.    Marland Kitchen lisinopril-hydrochlorothiazide (PRINZIDE,ZESTORETIC) 10-12.5 MG per tablet Take 1 tablet by mouth every morning.    . misoprostol (CYTOTEC) 100 MCG tablet Take 100 mcg by mouth 2 (two) times daily.    . Multiple Vitamin (MULTIVITAMIN WITH MINERALS) TABS tablet Take 1 tablet by  mouth daily.    . Naftifine HCl (NAFTIN) 2 % CREA Apply 1 application topically at bedtime. Applied around [place]    . Omega-3 Fatty Acids (FISH OIL) 1000 MG CAPS Take 1,000 mg by mouth 2 (two) times daily.    Marland Kitchen omeprazole (PRILOSEC) 20 MG capsule Take 20 mg by mouth every morning.    Marland Kitchen OVER THE COUNTER MEDICATION Take 1 tablet by mouth 2 (two) times daily. Bilberry with lutein    . sulindac (CLINORIL) 200 MG tablet Take 200 mg by mouth 2 (two) times daily.    . vitamin B-12 (CYANOCOBALAMIN) 1000 MCG tablet Take 1,000 mcg by mouth daily.      No results found for this or any previous visit (from the  past 48 hour(s)). No results found.  ROS: Pain with rom of the right upper extremity  Physical Exam:  Alert and oriented 69 y.o. male in no acute distress Cranial nerves 2-12 intact Cervical spine: full rom with no tenderness, nv intact distally Chest: active breath sounds bilaterally, no wheeze rhonchi or rales Heart: regular rate and rhythm, no murmur Abd: non tender non distended with active bowel sounds Hip is stable with rom  Right shoulder with mild limitation with rom due to pain Moderate weakness with ER strength nv intact distally No rashes or edema  Assessment/Plan Assessment: right shoulder rotator cuff tear  Plan: Patient will undergo a right shoulder cuff repair by Dr. Veverly Fells at [facility]. Risks benefits and expectations were discussed with the patient. Patient understand risks, benefits and expectations and wishes to proceed.

## 2014-11-12 ENCOUNTER — Other Ambulatory Visit: Payer: Self-pay | Admitting: Gastroenterology

## 2014-11-14 ENCOUNTER — Encounter (HOSPITAL_COMMUNITY): Payer: Self-pay | Admitting: Vascular Surgery

## 2014-11-14 ENCOUNTER — Inpatient Hospital Stay (HOSPITAL_COMMUNITY): Admission: RE | Admit: 2014-11-14 | Payer: Self-pay | Source: Ambulatory Visit

## 2014-12-06 NOTE — H&P (Signed)
Richard [name] is an 69 y.o. male.    Chief Complaint: right shoulder pain  HPI: Pt is a 69 y.o. male complaining of right shoulder pain for multiple months. Pain had continually increased since the beginning. X-rays in the clinic show rotator cuff tear and AC arthrosis right shoulder. Pt has tried various conservative treatments which have failed to alleviate their symptoms, including injections and therapy. Various options are discussed with the patient. Risks, benefits and expectations were discussed with the patient. Patient understand the risks, benefits and expectations and wishes to proceed with surgery.   PCP:  Merrilee Seashore, MD  D/C Plans: Home  PMH: Past Medical History  Diagnosis Date  . GERD (gastroesophageal reflux disease)   . Pain     PAIN AND OA RT KNEE  . Psoriasis   . Hypertension   . Pneumonia     YEARS AGO  . 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 Back pain   . Snoring 04/27/2014    PSH: Past Surgical History  Procedure Laterality Date  . Lump removed from left breast Left 2008    [place]- NOT CANCER 5 OR MORE YRS AGO  . Tonsillectomy      AS A CHILD  . Morton's neuroma removed    . Knee arthroscopy Right 01/31/2014    Procedure: RIGHT ARTHROSCOPY KNEE WITH DEBRIDMENT CHONDROPLASTY;  Surgeon: [name], MD;  Location: [facility];  Service: Orthopedics;  Laterality: Right;    Social History:  reports that he has quit smoking. His smoking use included Cigarettes. He does not have any smokeless tobacco history on file. He reports that he does not drink alcohol or use illicit drugs.  Allergies:  Allergies  Allergen Reactions  . Azithromycin Diarrhea and Nausea And Vomiting  . Latex     WHEN PT USED TO WEAR LATEX GLOVES AT WORK HE NOTICED SKIN REDNESS AND ITCHING OF HANDS.  Marland Kitchen Prednisone Other (See Comments)    "makes me angry and I feel weird."    Medications: No current facility-administered  medications for this encounter.   Current Outpatient Prescriptions  Medication Sig Dispense Refill  . CALCIUM-MAGNESIUM-ZINC PO Take 1 tablet by mouth 2 (two) times daily.     . cholecalciferol (VITAMIN D) 1000 UNITS tablet Take 2,000 Units by mouth daily.    . Clobetasol Propionate (TEMOVATE) 0.05 % external spray Apply 1 application topically daily. Knees    . cyclobenzaprine (FLEXERIL) 10 MG tablet Take 10 mg by mouth 3 (three) times daily as needed for muscle spasms (BACK).     Marland Kitchen desonide (DESOWEN) 0.05 % cream     . Glucosamine-Chondroit-Vit C-Mn (GLUCOSAMINE CHONDR 1500 COMPLX PO) Take 1 tablet by mouth 2 (two) times daily.    Marland Kitchen HYDROcodone-acetaminophen (NORCO/VICODIN) 5-325 MG per tablet Take 1-2 tablets by mouth every 4 (four) hours as needed for moderate pain. 6 AM AND 12NOON AND 6 PM 50 tablet 0  . hydrocortisone (ANUSOL-HC) 2.5 % rectal cream Place 1 application rectally as needed for hemorrhoids or itching.    . hydroxychloroquine (PLAQUENIL) 200 MG tablet Take 200 mg by mouth 2 (two) times daily.    Marland Kitchen lisinopril-hydrochlorothiazide (PRINZIDE,ZESTORETIC) 10-12.5 MG per tablet Take 1 tablet by mouth every morning.    . misoprostol (CYTOTEC) 100 MCG tablet Take 100 mcg by mouth 2 (two) times daily.    . Multiple Vitamin (MULTIVITAMIN WITH MINERALS) TABS tablet Take 1 tablet  by mouth daily.    . Naftifine HCl (NAFTIN) 2 % CREA Apply 1 application topically at bedtime. Applied around [place]    . Omega-3 Fatty Acids (FISH OIL) 1000 MG CAPS Take 1,000 mg by mouth 2 (two) times daily.    Marland Kitchen omeprazole (PRILOSEC) 20 MG capsule Take 20 mg by mouth every morning.    Marland Kitchen OVER THE COUNTER MEDICATION Take 1 tablet by mouth 2 (two) times daily. Bilberry with lutein    . sulindac (CLINORIL) 200 MG tablet Take 200 mg by mouth 2 (two) times daily.    . vitamin B-12 (CYANOCOBALAMIN) 1000 MCG tablet Take 1,000 mcg by mouth daily.      No results found for this or any previous visit (from the  past 48 hour(s)). No results found.  ROS: Pain with rom of the right upper extremity  Physical Exam:  Alert and oriented 69 y.o. male in no acute distress Cranial nerves 2-12 intact Cervical spine: full rom with no tenderness, nv intact distally Chest: active breath sounds bilaterally, no wheeze rhonchi or rales Heart: regular rate and rhythm, no murmur Abd: non tender non distended with active bowel sounds Hip is stable with rom  Right shoulder with mild limitation with rom nv intact distally Good strength but mild pain at extremes of rom No rashes or edema  Assessment/Plan Assessment: right shoulder pain secondary to rotator cuff tear and AC arthrosis  Plan: Patient will undergo a right shoulder scope and cuff repair by Dr. Veverly Fells at [facility]. Risks benefits and expectations were discussed with the patient. Patient understand risks, benefits and expectations and wishes to proceed.

## 2014-12-20 ENCOUNTER — Encounter (HOSPITAL_COMMUNITY)
Admission: RE | Admit: 2014-12-20 | Discharge: 2014-12-20 | Disposition: A | Discharge status: Home / Self Care | Payer: 59 | Source: Ambulatory Visit | Attending: Orthopedic Surgery | Admitting: Orthopedic Surgery

## 2014-12-20 ENCOUNTER — Encounter (HOSPITAL_COMMUNITY): Payer: Self-pay

## 2014-12-20 DIAGNOSIS — Z01812 Encounter for preprocedural laboratory examination: Secondary | ICD-10-CM | POA: Insufficient documentation

## 2014-12-20 HISTORY — DX: Headache, unspecified: R51.9

## 2014-12-20 HISTORY — DX: Headache: R51

## 2014-12-20 LAB — CBC
HCT: 39.4 % (ref 39.0–52.0)
Hemoglobin: 13.5 g/dL (ref 13.0–17.0)
MCH: 29.8 pg (ref 26.0–34.0)
MCHC: 34.3 g/dL (ref 30.0–36.0)
MCV: 87 fL (ref 78.0–100.0)
PLATELETS: 194 10*3/uL (ref 150–400)
RBC: 4.53 MIL/uL (ref 4.22–5.81)
RDW: 13 % (ref 11.5–15.5)
WBC: 6.5 10*3/uL (ref 4.0–10.5)

## 2014-12-20 LAB — BASIC METABOLIC PANEL
Anion gap: 6 (ref 5–15)
BUN: 13 mg/dL (ref 6–20)
CO2: 26 mmol/L (ref 22–32)
Calcium: 9.5 mg/dL (ref 8.9–10.3)
Chloride: 110 mmol/L (ref 101–111)
Creatinine, Ser: 0.93 mg/dL (ref 0.61–1.24)
Glucose, Bld: 138 mg/dL — ABNORMAL HIGH (ref 65–99)
Potassium: 3.6 mmol/L (ref 3.5–5.1)
SODIUM: 142 mmol/L (ref 135–145)

## 2014-12-20 LAB — SURGICAL PCR SCREEN
MRSA, PCR: NEGATIVE
STAPHYLOCOCCUS AUREUS: POSITIVE — AB

## 2014-12-20 NOTE — Pre-Procedure Instructions (Addendum)
Richard [name]  12/20/2014      [id number] - [place], [place] AT [facility] [address] [place] [address] Phone: [phone] Fax: [phone]    Your procedure is scheduled on June 24  Report to [facility] at 530 A.M.  Call this number if you have problems the morning of surgery:[phone]  Stop taking aspirin, BC's, Goody;s, any anti- inflammatory medicaions, Aleve, Ibuprofen, Herbal medications, or Fish Oil      Remember:  Do not eat food or drink liquids after midnight.  Take these medicines the morning of surgery with A SIP OF WATER Flexeril if needed, Hydrocodone if needed for pain, Prilosec   Do not wear jewelry, make-up or nail polish.  Do not wear lotions, powders, or perfumes.  You may wear deodorant.  Do not shave 48 hours prior to surgery.  Men may shave face and neck.  Do not bring valuables to the hospital.  [facility] is not responsible for any belongings or valuables.  Contacts, dentures or bridgework may not be worn into surgery.  Leave your suitcase in the car.  After surgery it may be brought to your room.  For patients admitted to the hospital, discharge time will be determined by your treatment team.  Patients discharged the day of surgery will not be allowed to drive home.    Special instructions:  [facility] - Preparing for Surgery  Before surgery, you can play an important role.  Because skin is not sterile, your skin needs to be as free of germs as possible.  You can reduce the number of germs on you skin by washing with CHG (chlorahexidine gluconate) soap before surgery.  CHG is an antiseptic cleaner which kills germs and bonds with the skin to continue killing germs even after washing.  Please DO NOT use if you have an allergy to CHG or antibacterial soaps.  If your skin becomes reddened/irritated stop using the CHG and inform your nurse when you arrive  at Short Stay.  Do not shave (including legs and underarms) for at least 48 hours prior to the first CHG shower.  You may shave your face.  Please follow these instructions carefully:   1.  Shower with CHG Soap the night before surgery and the    morning of Surgery.  2.  If you choose to wash your hair, wash your hair first as usual with your normal shampoo.  3.  After you shampoo, rinse your hair and body thoroughly to remove the    Shampoo.  4.  Use CHG as you would any other liquid soap.  You can apply chg directly  to the skin and wash gently with scrungie or a clean washcloth.  5.  Apply the CHG Soap to your body ONLY FROM THE NECK DOWN.   Do not use on open wounds or open sores.  Avoid contact with your eyes,   ears, mouth and genitals (private parts).  Wash genitals (private parts) with your normal soap.  6.  Wash thoroughly, paying special attention to the area where your surgery   will be performed.  7.  Thoroughly rinse your body with warm water from the neck down.  8.  DO NOT shower/wash with your normal soap after using and rinsing off  the CHG Soap.  9.  Pat yourself dry with a clean towel.  10.  Wear clean pajamas.            11.  Place clean sheets on your bed the night of your first shower and do not  sleep with pets.  Day of Surgery  Do not apply any lotions/deoderants the morning of surgery.  Please wear clean clothes to the hospital/surgery center.     Please read over the following fact sheets that you were given. Pain Booklet, Coughing and Deep Breathing, Blood Transfusion Information, MRSA Information and Surgical Site Infection Prevention

## 2014-12-20 NOTE — Progress Notes (Signed)
PCP is Dr Ashby Dawes and he sees Dr Lavona Mound for his arthritis States he had a stress test many years ago(more than 5) states it was ok Requested last office visit and EKG from Dr Ashby Dawes

## 2014-12-20 NOTE — Progress Notes (Signed)
I called a prescription for Mupirocin ointment to [facility], [place], [place].

## 2014-12-21 NOTE — Progress Notes (Signed)
Patient called and stated that he takes Lisinopril and Pravastatin and wanted to know if he needed to take it the day of surgery. Nurse explained to patient that he did not need to take those medications the morning of his surgery. Patient then stated that [place], RN told him what medications he needed to stop, but she did not give him instructions on what medications he needed to take day of surgery. Nurse then reviewed in detail the day of surgery instruction sheet, and instructed patient that he needed to continue taking all prescription medications and to stop taking any herbal medications. Patient verbalized understanding after much discussion. Nurse then instructed patient to call back with any further questions and he stated "I will." Call ended.

## 2014-12-28 ENCOUNTER — Ambulatory Visit (HOSPITAL_COMMUNITY): Admission: RE | Admit: 2014-12-28 | Payer: 59 | Source: Ambulatory Visit | Admitting: Orthopedic Surgery

## 2014-12-28 ENCOUNTER — Encounter (HOSPITAL_COMMUNITY): Admission: RE | Payer: Self-pay | Source: Ambulatory Visit

## 2014-12-28 SURGERY — SHOULDER ARTHROSCOPY WITH SUBACROMIAL DECOMPRESSION
Anesthesia: Regional | Site: Shoulder | Laterality: Right

## 2015-02-21 ENCOUNTER — Encounter (HOSPITAL_COMMUNITY)
Admission: RE | Admit: 2015-02-21 | Discharge: 2015-02-21 | Disposition: A | Discharge status: Home / Self Care | Payer: 59 | Source: Ambulatory Visit | Attending: Orthopedic Surgery | Admitting: Orthopedic Surgery

## 2015-02-21 DIAGNOSIS — M19011 Primary osteoarthritis, right shoulder: Secondary | ICD-10-CM | POA: Insufficient documentation

## 2015-02-21 DIAGNOSIS — Z01812 Encounter for preprocedural laboratory examination: Secondary | ICD-10-CM | POA: Insufficient documentation

## 2015-02-21 DIAGNOSIS — M75101 Unspecified rotator cuff tear or rupture of right shoulder, not specified as traumatic: Secondary | ICD-10-CM | POA: Insufficient documentation

## 2015-02-21 LAB — BASIC METABOLIC PANEL
ANION GAP: 7 (ref 5–15)
BUN: 14 mg/dL (ref 6–20)
CALCIUM: 9.5 mg/dL (ref 8.9–10.3)
CO2: 25 mmol/L (ref 22–32)
CREATININE: 0.83 mg/dL (ref 0.61–1.24)
Chloride: 109 mmol/L (ref 101–111)
GFR calc non Af Amer: >60 mL/min (ref >60)
Glucose, Bld: 105 mg/dL — ABNORMAL HIGH (ref 65–99)
Potassium: 3.9 mmol/L (ref 3.5–5.1)
Sodium: 141 mmol/L (ref 135–145)

## 2015-02-21 LAB — CBC
HCT: 40.3 % (ref 39.0–52.0)
Hemoglobin: 13.9 g/dL (ref 13.0–17.0)
MCH: 30 pg (ref 26.0–34.0)
MCHC: 34.5 g/dL (ref 30.0–36.0)
MCV: 87 fL (ref 78.0–100.0)
PLATELETS: 201 10*3/uL (ref 150–400)
RBC: 4.63 MIL/uL (ref 4.22–5.81)
RDW: 13 % (ref 11.5–15.5)
WBC: 7.6 10*3/uL (ref 4.0–10.5)

## 2015-02-21 NOTE — H&P (Signed)
Richard [name] is an 69 y.o. male.    Chief Complaint: right shoulder pain  HPI: Pt is a 69 y.o. male complaining of right shoulder pain for multiple months. Pain had continually increased since the beginning. X-rays in the clinic show right shoulder labral tearing. Pt has tried various conservative treatments which have failed to alleviate their symptoms, including injections and therapy. Various options are discussed with the patient. Risks, benefits and expectations were discussed with the patient. Patient understand the risks, benefits and expectations and wishes to proceed with surgery.   PCP:  Merrilee Seashore, MD  D/C Plans: Home  PMH: Past Medical History  Diagnosis Date  . GERD (gastroesophageal reflux disease)   . Pain     PAIN AND OA RT KNEE  . Psoriasis   . Hypertension   . Pneumonia     YEARS AGO  . 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 Back pain   . Snoring 04/27/2014  . Headache     PSH: Past Surgical History  Procedure Laterality Date  . Lump removed from left breast Left 2008    [place]- NOT CANCER 5 OR MORE YRS AGO  . Tonsillectomy      AS A CHILD  . Morton's neuroma removed    . Knee arthroscopy Right 01/31/2014    Procedure: RIGHT ARTHROSCOPY KNEE WITH DEBRIDMENT CHONDROPLASTY;  Surgeon: [name], MD;  Location: [facility];  Service: Orthopedics;  Laterality: Right;    Social History:  reports that he has quit smoking. His smoking use included Cigarettes. He does not have any smokeless tobacco history on file. He reports that he does not drink alcohol or use illicit drugs.  Allergies:  Allergies  Allergen Reactions  . Azithromycin Diarrhea and Nausea And Vomiting  . Latex     WHEN PT USED TO WEAR LATEX GLOVES AT WORK HE NOTICED SKIN REDNESS AND ITCHING OF HANDS.  Marland Kitchen Prednisone Other (See Comments)    "makes me angry and I feel weird."    Medications: No current facility-administered medications  for this encounter.   Current Outpatient Prescriptions  Medication Sig Dispense Refill  . aspirin 81 MG tablet Take 81 mg by mouth daily.    Marland Kitchen CALCIUM-MAGNESIUM-ZINC PO Take 1 tablet by mouth 2 (two) times daily.     . cholecalciferol (VITAMIN D) 1000 UNITS tablet Take 2,000 Units by mouth daily.    . cyclobenzaprine (FLEXERIL) 10 MG tablet Take 10 mg by mouth 3 (three) times daily as needed for muscle spasms (BACK).     . Glucosamine-Chondroit-Vit C-Mn (GLUCOSAMINE CHONDR 1500 COMPLX PO) Take 1 tablet by mouth 2 (two) times daily.    Marland Kitchen HYDROcodone-acetaminophen (NORCO/VICODIN) 5-325 MG per tablet Take 1-2 tablets by mouth every 4 (four) hours as needed for moderate pain. 6 AM AND 12NOON AND 6 PM 50 tablet 0  . hydroxychloroquine (PLAQUENIL) 200 MG tablet Take 200 mg by mouth 2 (two) times daily.    Marland Kitchen lisinopril-hydrochlorothiazide (PRINZIDE,ZESTORETIC) 10-12.5 MG per tablet Take 1 tablet by mouth every morning.    . misoprostol (CYTOTEC) 100 MCG tablet Take 100 mcg by mouth 2 (two) times daily.    . Multiple Vitamin (MULTIVITAMIN WITH MINERALS) TABS tablet Take 1 tablet by mouth daily.    . Naftifine HCl (NAFTIN) 2 % CREA Apply 1 application topically at bedtime. Applied around [place]    . Omega-3 Fatty Acids (FISH OIL)  1000 MG CAPS Take 1,000 mg by mouth 2 (two) times daily.    Marland Kitchen omeprazole (PRILOSEC) 20 MG capsule Take 20 mg by mouth every morning.    Marland Kitchen OTEZLA 30 MG TABS Take 30 mg by mouth 2 (two) times daily.  3  . OVER THE COUNTER MEDICATION Take 1 tablet by mouth daily. lutein    . pravastatin (PRAVACHOL) 20 MG tablet Take 20 mg by mouth daily.    . sulindac (CLINORIL) 200 MG tablet Take 200 mg by mouth 2 (two) times daily.    Marland Kitchen triamcinolone cream (KENALOG) 0.1 % Apply 1 application topically daily.    . vitamin B-12 (CYANOCOBALAMIN) 1000 MCG tablet Take 1,000 mcg by mouth daily.      No results found for this or any previous visit (from the past 48 hour(s)). No results  found.  ROS: Pain with rom of the right upper extremity  Physical Exam:  Alert and oriented 69 y.o. 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 Veverly Fells at [facility]. Risks benefits and expectations were discussed with the patient. Patient understand risks, benefits and expectations and wishes to proceed.

## 2015-02-21 NOTE — Pre-Procedure Instructions (Signed)
    Daksh Coates  02/21/2015      [facility] [address], [address] AT [facility] OF [address] [place] [address] Phone: [phone] Fax: [phone]    Your procedure is scheduled on March 01, 2015.  Report to [facility] Admitting at 8:30 A.M.  Call this number if you have problems the morning of surgery:  [phone]   Remember:  Do not eat food or drink liquids after midnight.  Take these medicines the morning of surgery with A SIP OF WATER: omeprazole (PRILOSEC),    STOP ASPIRIN, HERBAL MEDICATIONS, FISH OIL, NSAIDS  i.e.. sulindac (CLINORIL), ALEVE, IBUPROFEN, ADVIL)  ONE WEEK PRIOR TO SURGERY   Do not wear jewelry.  Do not wear lotions, powders, or colognes.  You may NOT wear deodorant.  Men may shave face and neck.  Do not bring valuables to the hospital.  [facility] is not responsible for any belongings or valuables.  Contacts, dentures or bridgework may not be worn into surgery.  Leave your suitcase in the car.  After surgery it may be brought to your room.  For patients admitted to the hospital, discharge time will be determined by your treatment team.  Patients discharged the day of surgery will not be allowed to drive home.   Name and phone number of your driver:   Jean-spouse Special instructions:  "PREPARING FOR SURGERY"  Please read over the following fact sheets that you were given. Pain Booklet, Coughing and Deep Breathing and Surgical Site Infection Prevention

## 2015-03-01 ENCOUNTER — Ambulatory Visit (HOSPITAL_COMMUNITY): Admission: RE | Admit: 2015-03-01 | Payer: 59 | Source: Ambulatory Visit | Admitting: Orthopedic Surgery

## 2015-03-01 ENCOUNTER — Encounter (HOSPITAL_COMMUNITY): Admission: RE | Payer: Self-pay | Source: Ambulatory Visit

## 2015-03-01 SURGERY — SHOULDER ARTHROSCOPY WITH SUBACROMIAL DECOMPRESSION
Anesthesia: Choice | Site: Shoulder | Laterality: Right

## 2015-03-04 ENCOUNTER — Other Ambulatory Visit: Payer: Self-pay | Admitting: Internal Medicine

## 2015-03-04 DIAGNOSIS — R634 Abnormal weight loss: Secondary | ICD-10-CM

## 2015-03-07 ENCOUNTER — Inpatient Hospital Stay: Admission: RE | Admit: 2015-03-07 | Payer: Self-pay | Source: Ambulatory Visit

## 2015-08-30 ENCOUNTER — Telehealth: Payer: Self-pay | Admitting: Internal Medicine

## 2015-08-30 NOTE — Telephone Encounter (Signed)
Unable to accommodate this request. These are the times I have available for new patients.

## 2015-08-30 NOTE — Telephone Encounter (Signed)
Pt has an establish appointment with you on Mon, 2/27.  However, pt states he is taking pain medication and muscle relaxers and it unable to get up that early due to being groggy. Pt wants and did cancel the appointment. Pt would like to know if you will allow him to come in at another time, due to his situation. This appointment was made back in 02/2015.  Pt has waited until today to call.  pls advise.

## 2015-09-02 ENCOUNTER — Ambulatory Visit: Payer: 59 | Admitting: Family Medicine

## 2015-10-16 DIAGNOSIS — Z01810 Encounter for preprocedural cardiovascular examination: Secondary | ICD-10-CM | POA: Diagnosis not present

## 2015-10-16 DIAGNOSIS — R9431 Abnormal electrocardiogram [ECG] [EKG]: Secondary | ICD-10-CM | POA: Diagnosis not present

## 2015-10-16 DIAGNOSIS — I10 Essential (primary) hypertension: Secondary | ICD-10-CM | POA: Diagnosis not present

## 2015-11-27 NOTE — Telephone Encounter (Signed)
Pt has chosen a new dr.

## 2015-12-03 ENCOUNTER — Ambulatory Visit: Payer: Self-pay | Admitting: Physical Medicine & Rehabilitation

## 2015-12-17 ENCOUNTER — Encounter: Payer: 59 | Attending: Physical Medicine & Rehabilitation

## 2015-12-17 ENCOUNTER — Encounter: Payer: Self-pay | Admitting: Physical Medicine & Rehabilitation

## 2015-12-17 ENCOUNTER — Ambulatory Visit (HOSPITAL_BASED_OUTPATIENT_CLINIC_OR_DEPARTMENT_OTHER): Payer: Worker's Compensation | Admitting: Physical Medicine & Rehabilitation

## 2015-12-17 VITALS — BP 107/72 | HR 60 | Resp 14

## 2015-12-17 DIAGNOSIS — L4050 Arthropathic psoriasis, unspecified: Secondary | ICD-10-CM | POA: Insufficient documentation

## 2015-12-17 DIAGNOSIS — M545 Low back pain, unspecified: Secondary | ICD-10-CM | POA: Insufficient documentation

## 2015-12-17 DIAGNOSIS — Z8739 Personal history of other diseases of the musculoskeletal system and connective tissue: Secondary | ICD-10-CM

## 2015-12-17 DIAGNOSIS — G8929 Other chronic pain: Secondary | ICD-10-CM | POA: Insufficient documentation

## 2015-12-17 DIAGNOSIS — Z872 Personal history of diseases of the skin and subcutaneous tissue: Secondary | ICD-10-CM | POA: Insufficient documentation

## 2015-12-17 DIAGNOSIS — M25561 Pain in right knee: Secondary | ICD-10-CM | POA: Insufficient documentation

## 2015-12-17 DIAGNOSIS — M25562 Pain in left knee: Secondary | ICD-10-CM

## 2015-12-17 NOTE — Patient Instructions (Signed)
Recommend getting theraband's to resume stretching exercises as well as the side-to-side stepping exercise with the theraband's around.  Recommend getting exercise ball to do squeezing exercises using the ball between the knees.  Recommend stationary bicycling starting at 10 minutes per day and working up to 45 minutes per day.

## 2015-12-17 NOTE — Progress Notes (Signed)
Subjective:    Patient ID: Richard [name], male    DOB: 01/19/1946, 70 y.o.   MRN: [id number] IME  requested by [facility] claims management services HPI Work injury 2011- [employer], fell 3 times tripping inside of truck . Worker's compensation approved diagnoses include back pain, bilateral knee pain, left ankle and foot pain. Right shoulder and cervical spine pain were denied under the claim.  Has been seeing physical medicine and rehabilitation specialist Dr. Brien Few.Records reviewed from [facility].  His diagnosis is lumbar facet arthropathy.  No lumbar medial branch blocks were performed. Recommendation was for radiofrequency neurotomy.Ses Dr. Brien Few on an every three-month basis. Is on hydrocodone 5 mg 3 times per day.  Was doing theraband exercises, Side-to-side stepping, hip flexor stretching, exercise ball between knees, Squeezing needs to get, hamstring stretch with theraband, Hip abduction exercises side-lying Stationary bicycling. Occ treadmill walking. "A little massage on the back"  Orthopedic eval 09/03/15- Patient report recommended to do exercise on an outpatient basis, community based. Gym membership was requested by Dr. Maureen Ralphs    Patient was doing therapy for about 1 year and then this was discontinued related to some shoulder surgery.  Surgery was canceled.  Currently doing some single leg raises while laying supine. Stretching of hamstring using towel.  walks 10-15 minutes per day  Past medical history significant for psoriatic arthritis on Plaquenil  Pain Inventory Average Pain 7 Pain Right Now 7 My pain is sharp, stabbing and aching  In the last 24 hours, has pain interfered with the following? General activity 9 Relation with others 1 Enjoyment of life 1 What TIME of day is your pain at its worst? daytime Sleep (in general) Fair  Pain is worse with: walking, bending, sitting, standing and some activites Pain improves with: rest,  therapy/exercise and medication Relief from Meds: 7  Mobility walk without assistance how many minutes can you walk? 10-15 do you drive?  yes  Function not employed: date last employed .  Neuro/Psych No problems in this area  Prior Studies New Visit IME  Physicians involved in your care New Visit IME    Family History  Problem Relation Age of Onset  . Diabetes Mother   . Hypertension Mother   . Stroke Father   . Hypertension Father    Social History   Social History  . Marital Status: Married    Spouse Name: N/A  . Number of Children: N/A  . Years of Education: N/A   Social History Main Topics  . Smoking status: Former Smoker    Types: Cigarettes  . Smokeless tobacco: None     Comment: quit around 2002  . Alcohol Use: No     Comment: QUIT SMOKING ABOUT 15 YRS AGO--WAS ONLY 2 OR 3 CIGARETTES WITH A BEER ON WEEK ENDS  . Drug Use: No  . Sexual Activity: Not Asked   Other Topics Concern  . None   Social History Narrative   Right handed, caffeine 2-3 cups daily, married, no kids, has baby bird.  WC back injury, so not working at this time.  BS in econmics.   Past Surgical History  Procedure Laterality Date  . Lump removed from left breast Left 2008    [place]- NOT CANCER 5 OR MORE YRS AGO  . Tonsillectomy      AS A CHILD  . Morton's neuroma removed    . Knee arthroscopy Right 01/31/2014    Procedure: RIGHT ARTHROSCOPY  KNEE WITH DEBRIDMENT CHONDROPLASTY;  Surgeon: [name], MD;  Location: [facility];  Service: Orthopedics;  Laterality: Right;   Past Medical History  Diagnosis Date  . GERD (gastroesophageal reflux disease)   . Pain     PAIN AND OA RT KNEE  . Psoriasis   . Hypertension   . Pneumonia     YEARS AGO  . 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 Back pain   . Snoring 04/27/2014  . Headache    BP 107/72 mmHg  Pulse 60  Resp 14  SpO2 96%  Opioid Risk Score:   Fall Risk Score:   `1  Depression screen PHQ 2/9  No flowsheet data found.  Review of Systems     Objective:   Physical Exam  Constitutional: He is oriented to person, place, and time. He appears well-developed and well-nourished.  HENT:  Head: Normocephalic and atraumatic.  Eyes: Conjunctivae and EOM are normal. Pupils are equal, round, and reactive to light.  Musculoskeletal:       Right hip: He exhibits decreased range of motion.       Left hip: He exhibits decreased range of motion.       Right knee: He exhibits decreased range of motion.       Left knee: He exhibits decreased range of motion.       Lumbar back: He exhibits decreased range of motion. He exhibits no deformity and no spasm.  Neurological: He is alert and oriented to person, place, and time.  Psychiatric: He has a normal mood and affect.  Nursing note and vitals reviewed. Skin shows large psoriatic plaques on bilateral pre Patellar areas  Lumbar flexion able to touch patella bending forward. Lumbar extension very limited with increased pain. 0-25% of normal. Lumbar lateral bending is 0-25% to the right and to the left.  Tenderness palpation at the lumbosacral junction. Paraspinal area. No tenderness in the cervical or thoracic or upper lumbar areas. No tenderness over the greater trochanter of the hips. Multiple finger joint deformities at the thumb MCP, fifth digit PIP, fourth digit PIP left, fifth digit PIP and DIP on the right, fourth digit PIP on the right, third digit PIP and DIP on the right, right first MCP.  Knee flexion right 110 left 100 degrees  Hip range of motion Moderately diminished internal/external rotation good flexion  Reduced pinprick sensation entire left lower extremity. Difficulty in donning shoes and socks due to range of motion decreased in both lower limbs particular at the hips     Assessment & Plan:  1. 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 No evidence of radiculopathy. He has  multiple joint range of motion restrictions related to his psoriatic arthritis. This is affecting his upper extremity but also most likely his hips and perhaps his spine.  We discussed his Exercise program in more detail. He feels like the physical therapy was very helpful for him and had been doing this for about a year. This was discontinuedAround August 2016.  He was using the exercise ball as well as theraband's. I think he would benefit from hip adductor exercises using the therapy ball, Squeezing ball between the knees Hip abductor exercises using a theraband loop around the ankles with side to side stepping Hamstring stretches using the theraband  Since it has been nearly a year since his last therapy visit and he has forgotten some of his exercises I think it  would be beneficial for 1-2 PT visits to instruct home exercise program. I would recommend that he is supplied with theraband as well as proper sized exercise ball  As I discussed with the patient I do not think he needs ongoing physical therapy. He has had approximately one year of therapy.  2. Chronic knee pain bilateral, the right side had a meniscal injury as well as chondral defect related to his work injury. I agree with orthopedics, stationary bicycling starting at 10 minutes per day working up 5 minutes per week to a total of 30 or 45 minutes per day.  The patient would like to do this in a fitness center. This can be done at home if he does have the necessary equipment.  This was a independent medical examination with no patient -physician relationship established

## 2016-02-12 DIAGNOSIS — L600 Ingrowing nail: Secondary | ICD-10-CM | POA: Diagnosis not present

## 2016-03-12 DIAGNOSIS — L4059 Other psoriatic arthropathy: Secondary | ICD-10-CM | POA: Diagnosis not present

## 2016-03-12 DIAGNOSIS — M150 Primary generalized (osteo)arthritis: Secondary | ICD-10-CM | POA: Diagnosis not present

## 2016-03-12 DIAGNOSIS — M2550 Pain in unspecified joint: Secondary | ICD-10-CM | POA: Diagnosis not present

## 2016-03-12 DIAGNOSIS — L409 Psoriasis, unspecified: Secondary | ICD-10-CM | POA: Diagnosis not present

## 2016-03-19 DIAGNOSIS — Z131 Encounter for screening for diabetes mellitus: Secondary | ICD-10-CM | POA: Diagnosis not present

## 2016-03-19 DIAGNOSIS — I10 Essential (primary) hypertension: Secondary | ICD-10-CM | POA: Diagnosis not present

## 2016-03-19 DIAGNOSIS — Z23 Encounter for immunization: Secondary | ICD-10-CM | POA: Diagnosis not present

## 2016-03-19 DIAGNOSIS — E782 Mixed hyperlipidemia: Secondary | ICD-10-CM | POA: Diagnosis not present

## 2016-03-19 DIAGNOSIS — Z125 Encounter for screening for malignant neoplasm of prostate: Secondary | ICD-10-CM | POA: Diagnosis not present

## 2016-04-09 DIAGNOSIS — E782 Mixed hyperlipidemia: Secondary | ICD-10-CM | POA: Diagnosis not present

## 2016-04-09 DIAGNOSIS — Z0000 Encounter for general adult medical examination without abnormal findings: Secondary | ICD-10-CM | POA: Diagnosis not present

## 2016-04-09 DIAGNOSIS — M2550 Pain in unspecified joint: Secondary | ICD-10-CM | POA: Diagnosis not present

## 2016-04-09 DIAGNOSIS — L4050 Arthropathic psoriasis, unspecified: Secondary | ICD-10-CM | POA: Diagnosis not present

## 2016-05-21 DIAGNOSIS — R05 Cough: Secondary | ICD-10-CM | POA: Diagnosis not present

## 2016-06-25 DIAGNOSIS — Z79899 Other long term (current) drug therapy: Secondary | ICD-10-CM | POA: Diagnosis not present

## 2016-07-23 ENCOUNTER — Ambulatory Visit (HOSPITAL_COMMUNITY)
Admission: EM | Admit: 2016-07-23 | Discharge: 2016-07-23 | Disposition: A | Discharge status: Home / Self Care | Payer: BLUE CROSS/BLUE SHIELD | Attending: Emergency Medicine | Admitting: Emergency Medicine

## 2016-07-23 ENCOUNTER — Encounter (HOSPITAL_COMMUNITY): Payer: Self-pay | Admitting: Emergency Medicine

## 2016-07-23 DIAGNOSIS — W19XXXA Unspecified fall, initial encounter: Secondary | ICD-10-CM | POA: Diagnosis not present

## 2016-07-23 DIAGNOSIS — R110 Nausea: Secondary | ICD-10-CM

## 2016-07-23 DIAGNOSIS — R51 Headache: Secondary | ICD-10-CM

## 2016-07-23 NOTE — ED Provider Notes (Signed)
CSN: [id number]     Arrival date & time 07/23/16  1758 History   First MD Initiated Contact with Patient 07/23/16 1854     Chief Complaint  Patient presents with  . Fall   (Consider location/radiation/quality/duration/timing/severity/associated sxs/prior Treatment) 71 year old male presents to clinic with chief complaint of headache and nausea. Patient reports he slipped this morning on ice, falling backwards, hitting his head on the ground. He denies any LOC, no amnesia, no disorientation. He reports his headache was gradual in onset, 4/10 in intensity, dull and achy. He has had mild nausea, has not vomited. No blurring vision, no double vision or other visual disturbances. He has no loss of sensation in his extremities, no loss of control of bowel or bladder.    The history is provided by the patient.  Fall     Past Medical History:  Diagnosis Date  . 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 Back pain   . GERD (gastroesophageal reflux disease)   . Headache   . Hypertension   . Pain    PAIN AND OA RT KNEE  . Pneumonia    YEARS AGO  . Psoriasis   . Snoring 04/27/2014   Past Surgical History:  Procedure Laterality Date  . KNEE ARTHROSCOPY Right 01/31/2014   Procedure: RIGHT ARTHROSCOPY KNEE WITH DEBRIDMENT CHONDROPLASTY;  Surgeon: [name], MD;  Location: [facility];  Service: Orthopedics;  Laterality: Right;  . LUMP REMOVED FROM LEFT BREAST Left 2008   [facility] SURGERY  AT [place] [facility]- NOT CANCER 5 OR MORE YRS AGO  . MORTON'S NEUROMA REMOVED    . TONSILLECTOMY     AS A CHILD   Family History  Problem Relation Age of Onset  . Diabetes Mother   . Hypertension Mother   . Stroke Father   . Hypertension Father    Social History  Substance Use Topics  . Smoking status: Former Smoker    Types: Cigarettes  . Smokeless tobacco: Never Used     Comment: quit around 2002  . Alcohol use No     Comment: QUIT SMOKING ABOUT 15 YRS AGO--WAS ONLY 2 OR 3  CIGARETTES WITH A BEER ON WEEK ENDS    Review of Systems  Reason unable to perform ROS: as covered in HPI.    Allergies  Azithromycin; Latex; and Prednisone  Home Medications   Prior to Admission medications   Medication Sig Start Date End Date Taking? Authorizing Provider  aspirin 81 MG tablet Take 81 mg by mouth daily.   Yes Historical Provider, MD  CALCIUM-MAGNESIUM-ZINC PO Take 1 tablet by mouth 2 (two) times daily.    Yes Historical Provider, MD  cholecalciferol (VITAMIN D) 1000 UNITS tablet Take 1,000 Units by mouth daily.    Yes Historical Provider, MD  Glucosamine-Chondroit-Vit C-Mn (GLUCOSAMINE CHONDR 1500 COMPLX PO) Take 1 tablet by mouth 2 (two) times daily.   Yes Historical Provider, MD  HYDROcodone-acetaminophen (NORCO/VICODIN) 5-325 MG per tablet Take 1-2 tablets by mouth every 4 (four) hours as needed for moderate pain. 6 AM AND 12NOON AND 6 PM 01/31/14  Yes Gaynelle Arabian, MD  hydroxychloroquine (PLAQUENIL) 200 MG tablet Take 200 mg by mouth 2 (two) times daily.   Yes Historical Provider, MD  lisinopril-hydrochlorothiazide (PRINZIDE,ZESTORETIC) 10-12.5 MG per tablet Take 1 tablet by mouth every morning.   Yes Historical Provider, MD  Lutein-Zeaxanthin 25-5 MG CAPS Take by mouth.   Yes Historical Provider, MD  misoprostol (CYTOTEC) 100 MCG tablet Take 100 mcg by mouth 2 (two) times daily.   Yes Historical Provider, MD  Multiple Vitamin (MULTIVITAMIN WITH MINERALS) TABS tablet Take 1 tablet by mouth daily.   Yes Historical Provider, MD  Omega-3 Fatty Acids (FISH OIL) 1000 MG CAPS Take 1,000 mg by mouth 2 (two) times daily.   Yes Historical Provider, MD  omeprazole (PRILOSEC) 20 MG capsule Take 20 mg by mouth every morning.   Yes Historical Provider, MD  OVER THE COUNTER MEDICATION Take 1 tablet by mouth daily. lutein   Yes Historical Provider, MD  pravastatin (PRAVACHOL) 20 MG tablet Take 20 mg by mouth daily.   Yes Historical Provider, MD  sulindac (CLINORIL) 200 MG tablet  Take 200 mg by mouth 2 (two) times daily.   Yes Historical Provider, MD  vitamin B-12 (CYANOCOBALAMIN) 1000 MCG tablet Take 1,000 mcg by mouth daily.   Yes Historical Provider, MD  cyclobenzaprine (FLEXERIL) 10 MG tablet Take 10 mg by mouth 3 (three) times daily as needed for muscle spasms (BACK).     Historical Provider, MD  Naftifine HCl (NAFTIN) 2 % CREA Apply 1 application topically at bedtime as needed. Applied around [place] Provider, MD  triamcinolone cream (KENALOG) 0.1 % Apply 1 application topically daily as needed.     Historical Provider, MD   Meds Ordered and Administered this Visit  Medications - No data to display  BP 129/66 (BP Location: Left Arm)   Pulse (!) 57   Temp 98.7 F (37.1 C) (Oral)   Resp 20   SpO2 98%  No data found.   Physical Exam  Constitutional: He is oriented to person, place, and time. He appears well-developed and well-nourished. No distress.  HENT:  Head: Normocephalic.  Right Ear: External ear normal.  Left Ear: External ear normal.  Eyes: Conjunctivae and EOM are normal. Pupils are equal, round, and reactive to light.  Neck: Normal range of motion. Neck supple. No JVD present.  Cardiovascular: Normal rate and regular rhythm.   Pulmonary/Chest: Effort normal.  Abdominal: Bowel sounds are normal.  Lymphadenopathy:    He has no cervical adenopathy.  Neurological: He is alert and oriented to person, place, and time. No cranial nerve deficit or sensory deficit. Coordination normal.  Skin: Skin is warm and dry. Capillary refill takes less than 2 seconds. He is not diaphoretic.  Nursing note and vitals reviewed.   Urgent Care Course     Procedures (including critical care time)  Labs Review Labs Reviewed - No data to display  Imaging Review No results found.   Visual Acuity Review  Right Eye Distance:   Left Eye Distance:   Bilateral Distance:    Right Eye Near:   Left Eye Near:    Bilateral Near:          MDM   1. Fall, initial encounter   You have been evaluated tonight for a fall. I would recommend you keep your scheduled appointment tomorrow with your primary care provider. You may take Tylenol as needed for headache and muscle aches, do no exceed any more than 4000 mg a day of tylenol. Should your headache worsen, develop double vision, worsening nausea, any vomiting, numbness or disorientation occur, seek treatment at the emergency room.    Barnet Glasgow, NP 07/23/16 1925

## 2016-07-23 NOTE — ED Triage Notes (Signed)
Pt reports he slipped on ice/snow and hit head  Reports HA has subsided  Denies LOC, vomiting   A&O x4... NAD

## 2016-07-23 NOTE — Discharge Instructions (Signed)
You have been evaluated tonight for a fall. I would recommend you keep your scheduled appointment tomorrow with your primary care provider. You may take Tylenol as needed for headache and muscle aches, do no exceed any more than 4000 mg a day of tylenol. Should your headache worsen, develop double vision, worsening nausea, any vomiting, numbness or disorientation occur, seek treatment at the emergency room.

## 2016-07-24 ENCOUNTER — Other Ambulatory Visit: Payer: Self-pay | Admitting: Internal Medicine

## 2016-07-24 ENCOUNTER — Ambulatory Visit
Admission: RE | Admit: 2016-07-24 | Discharge: 2016-07-24 | Disposition: A | Discharge status: Home / Self Care | Payer: BLUE CROSS/BLUE SHIELD | Source: Ambulatory Visit | Attending: Internal Medicine | Admitting: Internal Medicine

## 2016-07-24 DIAGNOSIS — R519 Headache, unspecified: Secondary | ICD-10-CM

## 2016-07-24 DIAGNOSIS — R110 Nausea: Secondary | ICD-10-CM | POA: Diagnosis not present

## 2016-07-24 DIAGNOSIS — R040 Epistaxis: Secondary | ICD-10-CM

## 2016-07-24 DIAGNOSIS — R51 Headache: Secondary | ICD-10-CM

## 2016-07-24 DIAGNOSIS — G44319 Acute post-traumatic headache, not intractable: Secondary | ICD-10-CM | POA: Diagnosis not present

## 2016-09-09 DIAGNOSIS — M150 Primary generalized (osteo)arthritis: Secondary | ICD-10-CM | POA: Diagnosis not present

## 2016-09-09 DIAGNOSIS — L4059 Other psoriatic arthropathy: Secondary | ICD-10-CM | POA: Diagnosis not present

## 2016-09-09 DIAGNOSIS — Z79899 Other long term (current) drug therapy: Secondary | ICD-10-CM | POA: Diagnosis not present

## 2016-09-09 DIAGNOSIS — M2550 Pain in unspecified joint: Secondary | ICD-10-CM | POA: Diagnosis not present

## 2016-09-09 DIAGNOSIS — L409 Psoriasis, unspecified: Secondary | ICD-10-CM | POA: Diagnosis not present

## 2016-09-16 DIAGNOSIS — Z0000 Encounter for general adult medical examination without abnormal findings: Secondary | ICD-10-CM | POA: Diagnosis not present

## 2016-09-16 DIAGNOSIS — R1013 Epigastric pain: Secondary | ICD-10-CM | POA: Diagnosis not present

## 2016-09-16 DIAGNOSIS — K219 Gastro-esophageal reflux disease without esophagitis: Secondary | ICD-10-CM | POA: Diagnosis not present

## 2016-09-16 DIAGNOSIS — R17 Unspecified jaundice: Secondary | ICD-10-CM | POA: Diagnosis not present

## 2016-09-21 DIAGNOSIS — R1013 Epigastric pain: Secondary | ICD-10-CM | POA: Diagnosis not present

## 2016-09-21 DIAGNOSIS — K219 Gastro-esophageal reflux disease without esophagitis: Secondary | ICD-10-CM | POA: Diagnosis not present

## 2016-09-30 DIAGNOSIS — J329 Chronic sinusitis, unspecified: Secondary | ICD-10-CM | POA: Diagnosis not present

## 2016-11-11 DIAGNOSIS — M2041 Other hammer toe(s) (acquired), right foot: Secondary | ICD-10-CM | POA: Diagnosis not present

## 2016-11-11 DIAGNOSIS — L97519 Non-pressure chronic ulcer of other part of right foot with unspecified severity: Secondary | ICD-10-CM | POA: Diagnosis not present

## 2016-12-15 DIAGNOSIS — R300 Dysuria: Secondary | ICD-10-CM | POA: Diagnosis not present

## 2016-12-17 DIAGNOSIS — M2041 Other hammer toe(s) (acquired), right foot: Secondary | ICD-10-CM | POA: Diagnosis not present

## 2016-12-19 ENCOUNTER — Encounter (HOSPITAL_COMMUNITY): Payer: Self-pay | Admitting: Emergency Medicine

## 2016-12-19 ENCOUNTER — Ambulatory Visit (HOSPITAL_COMMUNITY)
Admission: EM | Admit: 2016-12-19 | Discharge: 2016-12-19 | Disposition: A | Discharge status: Home / Self Care | Payer: BLUE CROSS/BLUE SHIELD | Attending: Family Medicine | Admitting: Family Medicine

## 2016-12-19 DIAGNOSIS — I10 Essential (primary) hypertension: Secondary | ICD-10-CM | POA: Diagnosis not present

## 2016-12-19 NOTE — Discharge Instructions (Signed)
Your blood pressure looked good this evening. Please continue to monitor.

## 2016-12-19 NOTE — ED Triage Notes (Signed)
Here for HTN... Reports BP is usually around 120/80s but lately has been around 130s/80s  Sx also include HA  A&O x4... NAD... Ambulatory

## 2016-12-21 NOTE — ED Provider Notes (Signed)
  [place]   [id number] 12/19/16 Arrival Time: [place]:  1. Essential hypertension     Reassured. BP looks good this evening. Close monitoring.  Reviewed expectations re: course of current medical issues. Questions answered. Outlined signs and symptoms indicating need for more acute intervention. Follow up here or in the Emergency Department if worsening. Patient verbalized understanding. After Visit Summary given.    SUBJECTIVE:  Richard [name] is a 71 y.o. male who presents for BP check. Has noticed slightly increased readings the past few days. Questions mild HA but infrequent and transient. No CP. Otherwise well.  ROS: As per HPI.   OBJECTIVE:  Vitals:   12/19/16 1934  BP: 137/74  Pulse: (!) 59  Resp: 18  Temp: 98.5 F (36.9 C)  TempSrc: Oral  SpO2: 99%     General appearance: alert, cooperative, appears stated age and no distress Lungs: clear to auscultation bilaterally Heart: regular rhythm (recheck pulse 62) Extremities: extremities normal, atraumatic, no cyanosis or edema Neuro: Grossly normal    Allergies  Allergen Reactions  . Azithromycin Diarrhea and Nausea And Vomiting  . Latex     WHEN PT USED TO WEAR LATEX GLOVES AT WORK HE NOTICED SKIN REDNESS AND ITCHING OF HANDS.  Marland Kitchen Prednisone Other (See Comments)    "makes me angry and I feel weird."    PMHx, SurgHx, SocialHx, Medications, and Allergies were reviewed in the Visit Navigator and updated as appropriate.       Vanessa Kick, MD 12/21/16 2033

## 2017-02-03 DIAGNOSIS — M2041 Other hammer toe(s) (acquired), right foot: Secondary | ICD-10-CM | POA: Diagnosis not present

## 2017-02-12 DIAGNOSIS — R05 Cough: Secondary | ICD-10-CM | POA: Diagnosis not present

## 2017-02-12 DIAGNOSIS — T17908A Unspecified foreign body in respiratory tract, part unspecified causing other injury, initial encounter: Secondary | ICD-10-CM | POA: Diagnosis not present

## 2017-02-24 DIAGNOSIS — R490 Dysphonia: Secondary | ICD-10-CM | POA: Diagnosis not present

## 2017-02-25 DIAGNOSIS — L4059 Other psoriatic arthropathy: Secondary | ICD-10-CM | POA: Diagnosis not present

## 2017-02-25 DIAGNOSIS — M2550 Pain in unspecified joint: Secondary | ICD-10-CM | POA: Diagnosis not present

## 2017-02-25 DIAGNOSIS — L409 Psoriasis, unspecified: Secondary | ICD-10-CM | POA: Diagnosis not present

## 2017-03-02 ENCOUNTER — Ambulatory Visit (INDEPENDENT_AMBULATORY_CARE_PROVIDER_SITE_OTHER): Payer: BLUE CROSS/BLUE SHIELD | Admitting: Podiatry

## 2017-03-02 ENCOUNTER — Encounter: Payer: Self-pay | Admitting: Podiatry

## 2017-03-02 VITALS — BP 143/83 | Ht 69.0 in | Wt 168.0 lb

## 2017-03-02 DIAGNOSIS — L97511 Non-pressure chronic ulcer of other part of right foot limited to breakdown of skin: Secondary | ICD-10-CM | POA: Diagnosis not present

## 2017-03-02 DIAGNOSIS — M2041 Other hammer toe(s) (acquired), right foot: Secondary | ICD-10-CM | POA: Diagnosis not present

## 2017-03-02 DIAGNOSIS — M79671 Pain in right foot: Secondary | ICD-10-CM

## 2017-03-02 NOTE — Patient Instructions (Signed)
Seen for ulcerating corn 2nd toe right. The lesion and nails debrided. Buttress pad placed under the 2nd toe. Reviewed surgical options, hammer toe repair or tenotomy office procedure. Patient will call for office procedure.

## 2017-03-02 NOTE — Progress Notes (Signed)
Bad back, arthritis with two bad shoulder.  SUBJECTIVE: 71 y.o. year old male presents with ulcerating hammer toe on 2nd right x over a year. Been treated twice. The first trimming helped a lot, but failed to improve with 2nd time trimming. Was referred by Dr. Leonette Nutting.   REVIEW OF SYSTEMS: A comprehensive review of systems was negative except for: back problem.  OBJECTIVE: DERMATOLOGIC EXAMINATION: Ulcerating callus distal end 2nd right.  VASCULAR EXAMINATION OF LOWER LIMBS: All pedal pulses are palpable with normal pulsation.  Capillary Filling times within 3 seconds in all digits.  Temperature gradient from tibial crest to dorsum of foot is within normal bilateral.  NEUROLOGIC EXAMINATION OF THE LOWER LIMBS: Achilles DTR is present and within normal. Monofilament (Semmes-Weinstein 10-gm) sensory testing positive 6 out of 6, bilateral. Vibratory sensations(128Hz  turning fork) intact at medial and lateral forefoot bilateral.  Sharp and Dull discriminatory sensations at the plantar ball of hallux is intact bilateral.   MUSCULOSKELETAL EXAMINATION: Positive for pre ulcerative digital corn distal end 2nd digit right. Severe digital contracture 2nd right at DIPJ and PIPJ with digital lesion at distal end. Moderate digital contractures 2-5 bilateral.  ASSESSMENT: Hammer toe deformity with pre ulcerative skin lesion 2nd right.  PLAN: Reviewed clinical findings and available treatment options, Hammer toe repair at out patient surgery center. Also provided possible option with tendon release office procedure, which does not guarantee of success due to rigidity of deformity. As per request all lesions and nails debrided. Patient will call us back for possible office tenotomy procedure.

## 2017-03-10 DIAGNOSIS — K219 Gastro-esophageal reflux disease without esophagitis: Secondary | ICD-10-CM | POA: Insufficient documentation

## 2017-03-10 DIAGNOSIS — R490 Dysphonia: Secondary | ICD-10-CM | POA: Diagnosis not present

## 2017-03-10 DIAGNOSIS — J310 Chronic rhinitis: Secondary | ICD-10-CM | POA: Diagnosis not present

## 2017-04-16 DIAGNOSIS — Z23 Encounter for immunization: Secondary | ICD-10-CM | POA: Diagnosis not present

## 2017-04-29 DIAGNOSIS — Z0000 Encounter for general adult medical examination without abnormal findings: Secondary | ICD-10-CM | POA: Diagnosis not present

## 2017-04-29 DIAGNOSIS — Z125 Encounter for screening for malignant neoplasm of prostate: Secondary | ICD-10-CM | POA: Diagnosis not present

## 2017-05-06 DIAGNOSIS — Z0000 Encounter for general adult medical examination without abnormal findings: Secondary | ICD-10-CM | POA: Diagnosis not present

## 2017-05-06 DIAGNOSIS — I10 Essential (primary) hypertension: Secondary | ICD-10-CM | POA: Diagnosis not present

## 2017-05-06 DIAGNOSIS — E782 Mixed hyperlipidemia: Secondary | ICD-10-CM | POA: Diagnosis not present

## 2017-05-06 DIAGNOSIS — L4050 Arthropathic psoriasis, unspecified: Secondary | ICD-10-CM | POA: Diagnosis not present

## 2017-06-26 DIAGNOSIS — L03012 Cellulitis of left finger: Secondary | ICD-10-CM | POA: Diagnosis not present

## 2017-07-14 DIAGNOSIS — L03019 Cellulitis of unspecified finger: Secondary | ICD-10-CM | POA: Diagnosis not present

## 2017-07-22 DIAGNOSIS — L03019 Cellulitis of unspecified finger: Secondary | ICD-10-CM | POA: Diagnosis not present

## 2017-09-02 DIAGNOSIS — L409 Psoriasis, unspecified: Secondary | ICD-10-CM | POA: Diagnosis not present

## 2017-09-02 DIAGNOSIS — M2550 Pain in unspecified joint: Secondary | ICD-10-CM | POA: Diagnosis not present

## 2017-09-02 DIAGNOSIS — M150 Primary generalized (osteo)arthritis: Secondary | ICD-10-CM | POA: Diagnosis not present

## 2017-09-02 DIAGNOSIS — L4059 Other psoriatic arthropathy: Secondary | ICD-10-CM | POA: Diagnosis not present

## 2017-11-10 DIAGNOSIS — M7541 Impingement syndrome of right shoulder: Secondary | ICD-10-CM | POA: Insufficient documentation

## 2017-11-10 DIAGNOSIS — M25511 Pain in right shoulder: Secondary | ICD-10-CM | POA: Diagnosis not present

## 2017-11-10 DIAGNOSIS — M7542 Impingement syndrome of left shoulder: Secondary | ICD-10-CM | POA: Diagnosis not present

## 2017-11-10 DIAGNOSIS — M25512 Pain in left shoulder: Secondary | ICD-10-CM | POA: Diagnosis not present

## 2018-03-02 DIAGNOSIS — M2550 Pain in unspecified joint: Secondary | ICD-10-CM | POA: Diagnosis not present

## 2018-03-02 DIAGNOSIS — Z6824 Body mass index (BMI) 24.0-24.9, adult: Secondary | ICD-10-CM | POA: Diagnosis not present

## 2018-03-02 DIAGNOSIS — M150 Primary generalized (osteo)arthritis: Secondary | ICD-10-CM | POA: Diagnosis not present

## 2018-03-02 DIAGNOSIS — L4059 Other psoriatic arthropathy: Secondary | ICD-10-CM | POA: Diagnosis not present

## 2018-04-01 DIAGNOSIS — Z23 Encounter for immunization: Secondary | ICD-10-CM | POA: Diagnosis not present

## 2018-04-11 DIAGNOSIS — R142 Eructation: Secondary | ICD-10-CM | POA: Diagnosis not present

## 2018-04-11 DIAGNOSIS — K5901 Slow transit constipation: Secondary | ICD-10-CM | POA: Diagnosis not present

## 2018-04-11 DIAGNOSIS — K210 Gastro-esophageal reflux disease with esophagitis: Secondary | ICD-10-CM | POA: Diagnosis not present

## 2018-04-13 DIAGNOSIS — R0789 Other chest pain: Secondary | ICD-10-CM | POA: Diagnosis not present

## 2018-04-13 DIAGNOSIS — R002 Palpitations: Secondary | ICD-10-CM | POA: Diagnosis not present

## 2018-04-13 DIAGNOSIS — K210 Gastro-esophageal reflux disease with esophagitis: Secondary | ICD-10-CM | POA: Diagnosis not present

## 2018-05-03 DIAGNOSIS — G44319 Acute post-traumatic headache, not intractable: Secondary | ICD-10-CM | POA: Diagnosis not present

## 2018-05-06 ENCOUNTER — Ambulatory Visit
Admission: RE | Admit: 2018-05-06 | Discharge: 2018-05-06 | Disposition: A | Discharge status: Home / Self Care | Payer: BLUE CROSS/BLUE SHIELD | Source: Ambulatory Visit | Attending: Internal Medicine | Admitting: Internal Medicine

## 2018-05-06 ENCOUNTER — Other Ambulatory Visit: Payer: Self-pay | Admitting: Internal Medicine

## 2018-05-06 DIAGNOSIS — S0990XA Unspecified injury of head, initial encounter: Secondary | ICD-10-CM | POA: Diagnosis not present

## 2018-05-06 DIAGNOSIS — G44319 Acute post-traumatic headache, not intractable: Secondary | ICD-10-CM | POA: Diagnosis not present

## 2018-05-06 DIAGNOSIS — R51 Headache: Secondary | ICD-10-CM | POA: Diagnosis not present

## 2018-05-06 DIAGNOSIS — I10 Essential (primary) hypertension: Secondary | ICD-10-CM | POA: Diagnosis not present

## 2018-05-06 DIAGNOSIS — R42 Dizziness and giddiness: Secondary | ICD-10-CM | POA: Diagnosis not present

## 2018-05-12 DIAGNOSIS — Z125 Encounter for screening for malignant neoplasm of prostate: Secondary | ICD-10-CM | POA: Diagnosis not present

## 2018-05-12 DIAGNOSIS — I10 Essential (primary) hypertension: Secondary | ICD-10-CM | POA: Diagnosis not present

## 2018-05-12 DIAGNOSIS — Z131 Encounter for screening for diabetes mellitus: Secondary | ICD-10-CM | POA: Diagnosis not present

## 2018-05-12 DIAGNOSIS — E782 Mixed hyperlipidemia: Secondary | ICD-10-CM | POA: Diagnosis not present

## 2018-05-12 DIAGNOSIS — Z0000 Encounter for general adult medical examination without abnormal findings: Secondary | ICD-10-CM | POA: Diagnosis not present

## 2018-05-19 DIAGNOSIS — L4050 Arthropathic psoriasis, unspecified: Secondary | ICD-10-CM | POA: Diagnosis not present

## 2018-05-19 DIAGNOSIS — I10 Essential (primary) hypertension: Secondary | ICD-10-CM | POA: Diagnosis not present

## 2018-05-19 DIAGNOSIS — E782 Mixed hyperlipidemia: Secondary | ICD-10-CM | POA: Diagnosis not present

## 2018-05-19 DIAGNOSIS — Z0000 Encounter for general adult medical examination without abnormal findings: Secondary | ICD-10-CM | POA: Diagnosis not present

## 2018-06-14 DIAGNOSIS — Z23 Encounter for immunization: Secondary | ICD-10-CM | POA: Diagnosis not present

## 2018-07-03 DIAGNOSIS — S62640A Nondisplaced fracture of proximal phalanx of right index finger, initial encounter for closed fracture: Secondary | ICD-10-CM | POA: Diagnosis not present

## 2018-07-03 DIAGNOSIS — I10 Essential (primary) hypertension: Secondary | ICD-10-CM | POA: Insufficient documentation

## 2018-07-03 DIAGNOSIS — M069 Rheumatoid arthritis, unspecified: Secondary | ICD-10-CM | POA: Insufficient documentation

## 2018-07-03 DIAGNOSIS — M79641 Pain in right hand: Secondary | ICD-10-CM | POA: Diagnosis not present

## 2018-07-27 DIAGNOSIS — S61217A Laceration without foreign body of left little finger without damage to nail, initial encounter: Secondary | ICD-10-CM | POA: Diagnosis not present

## 2018-07-28 DIAGNOSIS — Z23 Encounter for immunization: Secondary | ICD-10-CM | POA: Diagnosis not present

## 2018-09-07 DIAGNOSIS — L409 Psoriasis, unspecified: Secondary | ICD-10-CM | POA: Diagnosis not present

## 2018-09-07 DIAGNOSIS — Z79899 Other long term (current) drug therapy: Secondary | ICD-10-CM | POA: Diagnosis not present

## 2018-09-07 DIAGNOSIS — L4059 Other psoriatic arthropathy: Secondary | ICD-10-CM | POA: Diagnosis not present

## 2018-09-07 DIAGNOSIS — M150 Primary generalized (osteo)arthritis: Secondary | ICD-10-CM | POA: Diagnosis not present

## 2018-09-07 DIAGNOSIS — M2550 Pain in unspecified joint: Secondary | ICD-10-CM | POA: Diagnosis not present

## 2018-09-19 ENCOUNTER — Ambulatory Visit: Payer: BLUE CROSS/BLUE SHIELD | Admitting: Podiatry

## 2018-09-19 ENCOUNTER — Encounter: Payer: Self-pay | Admitting: Podiatry

## 2018-09-19 ENCOUNTER — Other Ambulatory Visit: Payer: Self-pay

## 2018-09-19 DIAGNOSIS — L84 Corns and callosities: Secondary | ICD-10-CM

## 2018-09-19 DIAGNOSIS — M2041 Other hammer toe(s) (acquired), right foot: Secondary | ICD-10-CM

## 2018-09-19 DIAGNOSIS — B351 Tinea unguium: Secondary | ICD-10-CM

## 2018-09-19 DIAGNOSIS — M79674 Pain in right toe(s): Secondary | ICD-10-CM | POA: Diagnosis not present

## 2018-09-19 DIAGNOSIS — M79675 Pain in left toe(s): Secondary | ICD-10-CM

## 2018-09-19 NOTE — Progress Notes (Signed)
Subjective:    Patient ID: Richard [name], male    DOB: 1945/11/20, 73 y.o.   MRN: [id number]  HPI 73 year old male presents the office today for concerns pain to his toes.  He gets pain to the tips of his toes mostly his right second toe when he gets a callus on the area as well as the left third toe.  He states that after some time it will heal up on its own and the pain goes away.  He also gets some discomfort of the toenails himself as they are becoming thickened discolored.  They cause irritation with shoes and pressure.  He has difficulty bending over and cannot cut his toenails.  He has hammertoes as well.  He said no significant treatment recently.  He does have rheumatoid arthritis and is currently being followed by Dr. Lenna Gilford for this.    Review of Systems  All other systems reviewed and are negative.  Past Medical History:  Diagnosis Date  . 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 Back pain   . GERD (gastroesophageal reflux disease)   . Headache   . Hypertension   . Pain    PAIN AND OA RT KNEE  . Pneumonia    YEARS AGO  . Psoriasis   . Snoring 04/27/2014    Past Surgical History:  Procedure Laterality Date  . KNEE ARTHROSCOPY Right 01/31/2014   Procedure: RIGHT ARTHROSCOPY KNEE WITH DEBRIDMENT CHONDROPLASTY;  Surgeon: [name], MD;  Location: [facility];  Service: Orthopedics;  Laterality: Right;  . LUMP REMOVED FROM LEFT BREAST Left 2008   STATES SURGERY  AT [facility] - LOCAL- NOT CANCER 5 OR MORE YRS AGO  . MORTON'S NEUROMA REMOVED    . TONSILLECTOMY     AS A CHILD     Current Outpatient Medications:  Marland Kitchen  Multiple Vitamin (MULTIVITAMIN) tablet, Take 1 tablet by mouth daily., Disp: , Rfl:  .  CALCIUM-MAGNESIUM-ZINC PO, Take 1 tablet by mouth 2 (two) times daily. , Disp: , Rfl:  .  cholecalciferol (VITAMIN D) 1000 UNITS tablet, Take 1,000 Units by mouth daily. , Disp: , Rfl:  .  cyclobenzaprine (FLEXERIL) 10 MG tablet, Take 10 mg by mouth 3  (three) times daily as needed for muscle spasms (BACK). , Disp: , Rfl:  .  Glucosamine-Chondroit-Vit C-Mn (GLUCOSAMINE CHONDR 1500 COMPLX PO), Take 1 tablet by mouth 2 (two) times daily., Disp: , Rfl:  .  HYDROcodone-acetaminophen (NORCO/VICODIN) 5-325 MG per tablet, Take 1-2 tablets by mouth every 4 (four) hours as needed for moderate pain. 6 AM AND 12NOON AND 6 PM, Disp: 50 tablet, Rfl: 0 .  hydroxychloroquine (PLAQUENIL) 200 MG tablet, Take 200 mg by mouth 2 (two) times daily., Disp: , Rfl:  .  lisinopril-hydrochlorothiazide (PRINZIDE,ZESTORETIC) 10-12.5 MG per tablet, Take 1 tablet by mouth every morning., Disp: , Rfl:  .  Lutein-Zeaxanthin 25-5 MG CAPS, Take by mouth., Disp: , Rfl:  .  misoprostol (CYTOTEC) 100 MCG tablet, Take 100 mcg by mouth 2 (two) times daily., Disp: , Rfl:  .  Multiple Vitamin (MULTIVITAMIN WITH MINERALS) TABS tablet, Take 1 tablet by mouth daily., Disp: , Rfl:  .  Omega-3 Fatty Acids (FISH OIL) 1000 MG CAPS, Take 1,000 mg by mouth 2 (two) times daily., Disp: , Rfl:  .  omeprazole (PRILOSEC) 20 MG capsule, Take 20 mg by mouth every morning., Disp: , Rfl:  .  OVER THE COUNTER MEDICATION, Take 1 tablet  by mouth daily. lutein, Disp: , Rfl:  .  pravastatin (PRAVACHOL) 20 MG tablet, Take 20 mg by mouth daily., Disp: , Rfl:  .  sulindac (CLINORIL) 200 MG tablet, Take 200 mg by mouth 2 (two) times daily., Disp: , Rfl:  .  triamcinolone cream (KENALOG) 0.1 %, Apply 1 application topically daily as needed. , Disp: , Rfl:  .  urea (GORDONS UREA) 40 % ointment, Apply topically at bedtime., Disp: 30 g, Rfl: 2 .  vitamin B-12 (CYANOCOBALAMIN) 1000 MCG tablet, Take 1,000 mcg by mouth daily., Disp: , Rfl:   Allergies  Allergen Reactions  . Azithromycin Diarrhea and Nausea And Vomiting  . Latex     WHEN PT USED TO WEAR LATEX GLOVES AT WORK HE NOTICED SKIN REDNESS AND ITCHING OF HANDS.  Marland Kitchen Prednisone Other (See Comments)    "makes me angry and I feel weird."         Objective:    Physical Exam General: AAO x3, NAD  Dermatological: Hyperkeratotic lesions at the distal aspect of the right second as well as left third toe.  Upon debridement there is no underlying ulceration, drainage or any signs of infection.  Over the nails are hypertrophic, dystrophic with yellow discoloration.  No significant discomfort of the toenails today but they are causing irritation inside shoes and with pressure.  No edema, erythema, drainage or pus coming from the toenail sites.  No open lesions.  Vascular: Dorsalis Pedis artery and Posterior Tibial artery pedal pulses are 2/4 bilateral with immedate capillary fill time.There is no pain with calf compression, swelling, warmth, erythema.   Neruologic: Grossly intact via light touch bilateral.  Protective threshold with Semmes Wienstein monofilament intact to all pedal sites bilateral.   Musculoskeletal: Rigid hammertoe contractures are present.  MMT 5/5.     Assessment & Plan:  73 year old male with symptomatic hyperkeratotic lesions, onychomycosis with hammertoe deformity -Treatment options discussed including all alternatives, risks, and complications -Etiology of symptoms were discussed -I debrided the hyperkeratotic lesions x2 without any complications or bleeding.  Offloading pads dispensed. -Nails debrided x10 without any complications or bleeding.  Prescribed urea cream for the toenails given the dystrophy and thickening. -Follow-up in 3 months as needed for routine care or sooner if any issues are to arise.  He has difficulty trimming his toenails this would be helpful for him given his inability to cut them and they are causing discomfort.  Trula Slade DPM

## 2018-09-20 MED ORDER — UREA 40 % EX OINT: TOPICAL_OINTMENT | Freq: Every day | CUTANEOUS | 2 refills | Status: DC

## 2018-09-27 DIAGNOSIS — K4090 Unilateral inguinal hernia, without obstruction or gangrene, not specified as recurrent: Secondary | ICD-10-CM | POA: Diagnosis not present

## 2018-10-25 DIAGNOSIS — I10 Essential (primary) hypertension: Secondary | ICD-10-CM | POA: Diagnosis not present

## 2018-10-25 DIAGNOSIS — R05 Cough: Secondary | ICD-10-CM | POA: Diagnosis not present

## 2018-10-25 DIAGNOSIS — J988 Other specified respiratory disorders: Secondary | ICD-10-CM | POA: Diagnosis not present

## 2018-11-04 DIAGNOSIS — R05 Cough: Secondary | ICD-10-CM | POA: Diagnosis not present

## 2018-11-08 DIAGNOSIS — Z7189 Other specified counseling: Secondary | ICD-10-CM | POA: Diagnosis not present

## 2018-11-15 DIAGNOSIS — R05 Cough: Secondary | ICD-10-CM | POA: Diagnosis not present

## 2018-11-15 DIAGNOSIS — J988 Other specified respiratory disorders: Secondary | ICD-10-CM | POA: Diagnosis not present

## 2018-11-15 DIAGNOSIS — Z7189 Other specified counseling: Secondary | ICD-10-CM | POA: Diagnosis not present

## 2018-11-22 DIAGNOSIS — Z20828 Contact with and (suspected) exposure to other viral communicable diseases: Secondary | ICD-10-CM | POA: Diagnosis not present

## 2018-11-22 DIAGNOSIS — J329 Chronic sinusitis, unspecified: Secondary | ICD-10-CM | POA: Diagnosis not present

## 2018-11-22 DIAGNOSIS — U071 COVID-19: Secondary | ICD-10-CM | POA: Diagnosis not present

## 2018-11-22 DIAGNOSIS — I10 Essential (primary) hypertension: Secondary | ICD-10-CM | POA: Diagnosis not present

## 2018-11-22 DIAGNOSIS — R05 Cough: Secondary | ICD-10-CM | POA: Diagnosis not present

## 2018-11-23 DIAGNOSIS — Z20828 Contact with and (suspected) exposure to other viral communicable diseases: Secondary | ICD-10-CM | POA: Diagnosis not present

## 2018-12-12 DIAGNOSIS — F1120 Opioid dependence, uncomplicated: Secondary | ICD-10-CM | POA: Diagnosis not present

## 2018-12-12 DIAGNOSIS — M47817 Spondylosis without myelopathy or radiculopathy, lumbosacral region: Secondary | ICD-10-CM | POA: Diagnosis not present

## 2018-12-12 DIAGNOSIS — Z79899 Other long term (current) drug therapy: Secondary | ICD-10-CM | POA: Diagnosis not present

## 2018-12-19 ENCOUNTER — Encounter: Payer: Self-pay | Admitting: Podiatry

## 2018-12-19 ENCOUNTER — Other Ambulatory Visit: Payer: Self-pay

## 2018-12-19 ENCOUNTER — Ambulatory Visit: Payer: BLUE CROSS/BLUE SHIELD | Admitting: Podiatry

## 2018-12-19 VITALS — Temp 98.2°F

## 2018-12-19 DIAGNOSIS — M79675 Pain in left toe(s): Secondary | ICD-10-CM | POA: Diagnosis not present

## 2018-12-19 DIAGNOSIS — M79674 Pain in right toe(s): Secondary | ICD-10-CM | POA: Diagnosis not present

## 2018-12-19 DIAGNOSIS — L84 Corns and callosities: Secondary | ICD-10-CM | POA: Diagnosis not present

## 2018-12-19 DIAGNOSIS — B351 Tinea unguium: Secondary | ICD-10-CM | POA: Diagnosis not present

## 2018-12-19 NOTE — Progress Notes (Signed)
Subjective: 73 y.o. returns the office today for painful, elongated, thickened toenails which he cannot trim himself. Denies any redness or drainage around the nails. He also gets callus on the 2nd toes that become painful.  He states that after he is in the shower he started to loosen up, to some degree.  Denies any acute changes since last appointment and no new complaints today. Denies any systemic complaints such as fevers, chills, nausea, vomiting.   PCP: Merrilee Seashore, MD   Objective: AAO 3, NAD DP/PT pulses palpable, CRT less than 3 seconds Nails hypertrophic, dystrophic, elongated, brittle, discolored 10. There is tenderness overlying the nails 1-5 bilaterally. There is no surrounding erythema or drainage along the nail sites. Hyperkeratotic lesion of the distal aspect of bilateral second toes.  Upon debridement no ongoing ulceration or drainage or signs of infection. No open lesions or pre-ulcerative lesions are identified. No other areas of tenderness bilateral lower extremities. No overlying edema, erythema, increased warmth. No pain with calf compression, swelling, warmth, erythema.  Assessment: Patient presents with symptomatic onychomycosis, hyperkeratotic lesions  Plan: -Treatment options including alternatives, risks, complications were discussed -Nails sharply debrided 10 without complication/bleeding. -Discussed daily foot inspection. If there are any changes, to call the office immediately.  -Hyperkeratotic lesion sharply debrided x2 without complications or bleeding -Follow-up in 3 months or sooner if any problems are to arise. In the meantime, encouraged to call the office with any questions, concerns, changes symptoms.  Celesta Gentile, DPM

## 2019-01-12 DIAGNOSIS — M170 Bilateral primary osteoarthritis of knee: Secondary | ICD-10-CM | POA: Diagnosis not present

## 2019-01-19 DIAGNOSIS — M170 Bilateral primary osteoarthritis of knee: Secondary | ICD-10-CM | POA: Diagnosis not present

## 2019-01-26 DIAGNOSIS — M170 Bilateral primary osteoarthritis of knee: Secondary | ICD-10-CM | POA: Diagnosis not present

## 2019-01-31 ENCOUNTER — Other Ambulatory Visit: Payer: Self-pay

## 2019-01-31 DIAGNOSIS — Z20822 Contact with and (suspected) exposure to covid-19: Secondary | ICD-10-CM

## 2019-01-31 DIAGNOSIS — R6889 Other general symptoms and signs: Secondary | ICD-10-CM | POA: Diagnosis not present

## 2019-02-02 ENCOUNTER — Other Ambulatory Visit: Payer: Self-pay

## 2019-02-02 DIAGNOSIS — Z20822 Contact with and (suspected) exposure to covid-19: Secondary | ICD-10-CM

## 2019-02-02 DIAGNOSIS — R6889 Other general symptoms and signs: Secondary | ICD-10-CM | POA: Diagnosis not present

## 2019-02-02 LAB — NOVEL CORONAVIRUS, NAA: SARS-CoV-2, NAA: NOT DETECTED

## 2019-02-04 LAB — NOVEL CORONAVIRUS, NAA: SARS-CoV-2, NAA: NOT DETECTED

## 2019-03-03 DIAGNOSIS — Z23 Encounter for immunization: Secondary | ICD-10-CM | POA: Diagnosis not present

## 2019-03-07 DIAGNOSIS — M47817 Spondylosis without myelopathy or radiculopathy, lumbosacral region: Secondary | ICD-10-CM | POA: Diagnosis not present

## 2019-03-07 DIAGNOSIS — F1120 Opioid dependence, uncomplicated: Secondary | ICD-10-CM | POA: Diagnosis not present

## 2019-03-07 DIAGNOSIS — Z79899 Other long term (current) drug therapy: Secondary | ICD-10-CM | POA: Diagnosis not present

## 2019-03-15 DIAGNOSIS — L409 Psoriasis, unspecified: Secondary | ICD-10-CM | POA: Diagnosis not present

## 2019-03-15 DIAGNOSIS — M150 Primary generalized (osteo)arthritis: Secondary | ICD-10-CM | POA: Diagnosis not present

## 2019-03-15 DIAGNOSIS — L4059 Other psoriatic arthropathy: Secondary | ICD-10-CM | POA: Diagnosis not present

## 2019-03-15 DIAGNOSIS — M2550 Pain in unspecified joint: Secondary | ICD-10-CM | POA: Diagnosis not present

## 2019-03-21 ENCOUNTER — Other Ambulatory Visit: Payer: Self-pay

## 2019-03-21 ENCOUNTER — Encounter: Payer: Self-pay | Admitting: Podiatry

## 2019-03-21 ENCOUNTER — Ambulatory Visit: Payer: BC Managed Care – PPO | Admitting: Podiatry

## 2019-03-21 DIAGNOSIS — L84 Corns and callosities: Secondary | ICD-10-CM

## 2019-03-21 DIAGNOSIS — B351 Tinea unguium: Secondary | ICD-10-CM | POA: Diagnosis not present

## 2019-03-21 DIAGNOSIS — M79675 Pain in left toe(s): Secondary | ICD-10-CM | POA: Diagnosis not present

## 2019-03-21 DIAGNOSIS — M79674 Pain in right toe(s): Secondary | ICD-10-CM

## 2019-03-22 NOTE — Progress Notes (Signed)
Subjective: 73 y.o. returns the office today for painful, elongated, thickened toenails which he cannot trim himself. Denies any redness or drainage around the nails. He also gets callus on the 2nd toes that become painful.  No other concerns. Denies any systemic complaints such as fevers, chills, nausea, vomiting.   PCP: Merrilee Seashore, MD  Objective: AAO 3, NAD DP/PT pulses palpable, CRT less than 3 seconds Nails hypertrophic, dystrophic, elongated, brittle, discolored 10. There is tenderness overlying the nails 1-5 bilaterally. There is no surrounding erythema or drainage along the nail sites. Hyperkeratotic lesion of the distal aspect of bilateral second toes.  Upon debridement no ongoing ulceration or drainage or signs of infection. No open lesions or pre-ulcerative lesions are identified. No other areas of tenderness bilateral lower extremities. No overlying edema, erythema, increased warmth. No pain with calf compression, swelling, warmth, erythema.  Assessment: Patient presents with symptomatic onychomycosis, hyperkeratotic lesions  Plan: -Treatment options including alternatives, risks, complications were discussed -Nails sharply debrided 10 without complication/bleeding. -Hyperkeratotic lesion sharply.  X2 without any complications or bleeding. -Discussed daily foot inspection. If there are any changes, to call the office immediately.  -Follow-up in 3 months or sooner if any problems are to arise. In the meantime, encouraged to call the office with any questions, concerns, changes symptoms.  Celesta Gentile, DPM

## 2019-03-23 DIAGNOSIS — K4090 Unilateral inguinal hernia, without obstruction or gangrene, not specified as recurrent: Secondary | ICD-10-CM | POA: Diagnosis not present

## 2019-03-23 DIAGNOSIS — N50812 Left testicular pain: Secondary | ICD-10-CM | POA: Diagnosis not present

## 2019-03-29 ENCOUNTER — Other Ambulatory Visit: Payer: Self-pay | Admitting: General Surgery

## 2019-03-29 DIAGNOSIS — N50812 Left testicular pain: Secondary | ICD-10-CM

## 2019-04-03 ENCOUNTER — Ambulatory Visit
Admission: RE | Admit: 2019-04-03 | Discharge: 2019-04-03 | Disposition: A | Discharge status: Home / Self Care | Payer: BC Managed Care – PPO | Source: Ambulatory Visit | Attending: General Surgery | Admitting: General Surgery

## 2019-04-03 DIAGNOSIS — N503 Cyst of epididymis: Secondary | ICD-10-CM | POA: Diagnosis not present

## 2019-04-03 DIAGNOSIS — N50812 Left testicular pain: Secondary | ICD-10-CM

## 2019-04-03 DIAGNOSIS — N442 Benign cyst of testis: Secondary | ICD-10-CM | POA: Diagnosis not present

## 2019-04-05 ENCOUNTER — Other Ambulatory Visit: Payer: Self-pay | Admitting: General Surgery

## 2019-04-13 ENCOUNTER — Other Ambulatory Visit: Payer: Self-pay

## 2019-04-13 DIAGNOSIS — Z20822 Contact with and (suspected) exposure to covid-19: Secondary | ICD-10-CM

## 2019-04-13 DIAGNOSIS — Z20828 Contact with and (suspected) exposure to other viral communicable diseases: Secondary | ICD-10-CM | POA: Diagnosis not present

## 2019-04-14 LAB — NOVEL CORONAVIRUS, NAA: SARS-CoV-2, NAA: NOT DETECTED

## 2019-04-28 DIAGNOSIS — M25561 Pain in right knee: Secondary | ICD-10-CM | POA: Diagnosis not present

## 2019-04-28 DIAGNOSIS — M25562 Pain in left knee: Secondary | ICD-10-CM | POA: Diagnosis not present

## 2019-05-19 ENCOUNTER — Other Ambulatory Visit: Payer: Self-pay

## 2019-05-19 DIAGNOSIS — Z20822 Contact with and (suspected) exposure to covid-19: Secondary | ICD-10-CM

## 2019-05-22 LAB — NOVEL CORONAVIRUS, NAA: SARS-CoV-2, NAA: NOT DETECTED

## 2019-05-26 DIAGNOSIS — N5082 Scrotal pain: Secondary | ICD-10-CM | POA: Diagnosis not present

## 2019-05-31 DIAGNOSIS — I10 Essential (primary) hypertension: Secondary | ICD-10-CM | POA: Diagnosis not present

## 2019-05-31 DIAGNOSIS — Z20828 Contact with and (suspected) exposure to other viral communicable diseases: Secondary | ICD-10-CM | POA: Diagnosis not present

## 2019-05-31 DIAGNOSIS — Z125 Encounter for screening for malignant neoplasm of prostate: Secondary | ICD-10-CM | POA: Diagnosis not present

## 2019-05-31 DIAGNOSIS — Z0000 Encounter for general adult medical examination without abnormal findings: Secondary | ICD-10-CM | POA: Diagnosis not present

## 2019-06-05 DIAGNOSIS — M47817 Spondylosis without myelopathy or radiculopathy, lumbosacral region: Secondary | ICD-10-CM | POA: Diagnosis not present

## 2019-06-05 DIAGNOSIS — F1120 Opioid dependence, uncomplicated: Secondary | ICD-10-CM | POA: Diagnosis not present

## 2019-06-05 DIAGNOSIS — Z79899 Other long term (current) drug therapy: Secondary | ICD-10-CM | POA: Diagnosis not present

## 2019-06-08 ENCOUNTER — Other Ambulatory Visit: Payer: Self-pay

## 2019-06-08 DIAGNOSIS — Z20822 Contact with and (suspected) exposure to covid-19: Secondary | ICD-10-CM

## 2019-06-11 LAB — NOVEL CORONAVIRUS, NAA: SARS-CoV-2, NAA: NOT DETECTED

## 2019-06-13 DIAGNOSIS — Z0000 Encounter for general adult medical examination without abnormal findings: Secondary | ICD-10-CM | POA: Diagnosis not present

## 2019-06-13 DIAGNOSIS — I10 Essential (primary) hypertension: Secondary | ICD-10-CM | POA: Diagnosis not present

## 2019-06-13 DIAGNOSIS — U071 COVID-19: Secondary | ICD-10-CM | POA: Diagnosis not present

## 2019-06-13 DIAGNOSIS — R002 Palpitations: Secondary | ICD-10-CM | POA: Diagnosis not present

## 2019-06-13 DIAGNOSIS — R0602 Shortness of breath: Secondary | ICD-10-CM | POA: Diagnosis not present

## 2019-06-13 DIAGNOSIS — E782 Mixed hyperlipidemia: Secondary | ICD-10-CM | POA: Diagnosis not present

## 2019-06-13 DIAGNOSIS — L4050 Arthropathic psoriasis, unspecified: Secondary | ICD-10-CM | POA: Diagnosis not present

## 2019-06-13 DIAGNOSIS — K4090 Unilateral inguinal hernia, without obstruction or gangrene, not specified as recurrent: Secondary | ICD-10-CM | POA: Diagnosis not present

## 2019-06-14 DIAGNOSIS — R002 Palpitations: Secondary | ICD-10-CM | POA: Diagnosis not present

## 2019-06-20 ENCOUNTER — Other Ambulatory Visit: Payer: Self-pay

## 2019-06-20 ENCOUNTER — Ambulatory Visit (INDEPENDENT_AMBULATORY_CARE_PROVIDER_SITE_OTHER): Payer: BC Managed Care – PPO | Admitting: Podiatry

## 2019-06-20 DIAGNOSIS — Q828 Other specified congenital malformations of skin: Secondary | ICD-10-CM

## 2019-06-20 DIAGNOSIS — M79675 Pain in left toe(s): Secondary | ICD-10-CM

## 2019-06-20 DIAGNOSIS — B351 Tinea unguium: Secondary | ICD-10-CM | POA: Diagnosis not present

## 2019-06-20 DIAGNOSIS — M79674 Pain in right toe(s): Secondary | ICD-10-CM | POA: Diagnosis not present

## 2019-06-20 MED ORDER — TRIAMCINOLONE ACETONIDE 0.1 % EX CREA: 1.0000 "application " | TOPICAL_CREAM | Freq: Every day | CUTANEOUS | 1 refills | Status: AC

## 2019-06-21 NOTE — Progress Notes (Signed)
Subjective: 73 y.o. returns the office today for painful, elongated, thickened toenails which he cannot trim himself. Denies any redness or drainage around the nails.  He does have some dried blood on the right second toe.  Denies any swelling or redness.  He also has psoriasis on his toes and his asthmatic cream for this.  No other concerns. Denies any systemic complaints such as fevers, chills, nausea, vomiting.   PCP: Merrilee Seashore, MD  Objective: AAO 3, NAD DP/PT pulses palpable, CRT less than 3 seconds Nails hypertrophic, dystrophic, elongated, brittle, discolored 10. There is tenderness overlying the nails 1-5 bilaterally. There is no surrounding erythema or drainage along the nail sites. Hyperkeratotic lesion of the distal aspect of bilateral second toes.  Upon debridement no ongoing ulceration or drainage or signs of infection.  Dried blood on the right second toe but there is no open lesion identified today.  No ulceration. Dry scaly, erythematous skin on the toes is on the right foot.  This is consistent with psoriasis. No open lesions or pre-ulcerative lesions are identified. No other areas of tenderness bilateral lower extremities. No overlying edema, erythema, increased warmth. No pain with calf compression, swelling, warmth, erythema.  Assessment: Patient presents with symptomatic onychomycosis, hyperkeratotic lesions; psoriasis  Plan: -Treatment options including alternatives, risks, complications were discussed -Nails sharply debrided 10 without complication/bleeding. -Hyperkeratotic lesion sharply.  X2 without any complications or bleeding.  Recommend antibiotic ointment on the right second toe.  There are some dried blood there is no ulceration at this time but monitor closely for any skin breakdown. -Prescribed triamcinolone for the psoriasis. -Discussed daily foot inspection. If there are any changes, to call the office immediately.  -Follow-up in 3 months or  sooner if any problems are to arise. In the meantime, encouraged to call the office with any questions, concerns, changes symptoms.  Celesta Gentile, DPM

## 2019-07-11 DIAGNOSIS — R002 Palpitations: Secondary | ICD-10-CM | POA: Diagnosis not present

## 2019-07-13 DIAGNOSIS — I472 Ventricular tachycardia: Secondary | ICD-10-CM | POA: Diagnosis not present

## 2019-07-13 DIAGNOSIS — I10 Essential (primary) hypertension: Secondary | ICD-10-CM | POA: Diagnosis not present

## 2019-07-13 DIAGNOSIS — R002 Palpitations: Secondary | ICD-10-CM | POA: Diagnosis not present

## 2019-07-13 DIAGNOSIS — E782 Mixed hyperlipidemia: Secondary | ICD-10-CM | POA: Diagnosis not present

## 2019-07-25 DIAGNOSIS — I472 Ventricular tachycardia: Secondary | ICD-10-CM | POA: Insufficient documentation

## 2019-07-25 DIAGNOSIS — I471 Supraventricular tachycardia: Secondary | ICD-10-CM | POA: Insufficient documentation

## 2019-07-25 DIAGNOSIS — I4729 Other ventricular tachycardia: Secondary | ICD-10-CM | POA: Insufficient documentation

## 2019-07-25 DIAGNOSIS — E782 Mixed hyperlipidemia: Secondary | ICD-10-CM | POA: Insufficient documentation

## 2019-07-25 NOTE — Progress Notes (Signed)
Patient referred by Merrilee Seashore, MD for SVT, NSVT, PAC  Subjective:   Richard [name], male    DOB: Sep 04, 1945, 74 y.o.   MRN: [id number]   Chief Complaint  Patient presents with  . SVT  . NSVT    HPI  74 y.o. Caucasian male with hypertension, hyperlipidemia, rheumatoid arthritis SVT, NSVT.  Patient contracted Covid in March 2020.  Ever since, he has had lingering cough, shortness of breath with exertion.  He is also had episodes of palpitations which are short lasting. Patient recently underwent Zio patch Holter monitor, that showed 11% PAC burden with frequent PAC/SVAT, and one episode of 5 beat ventricular tachycardia. He reportedly also had episodes of SVT.     Past Medical History:  Diagnosis Date  . 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 Back pain   . GERD (gastroesophageal reflux disease)   . Headache   . Hypertension   . Pain    PAIN AND OA RT KNEE  . Pneumonia    YEARS AGO  . Psoriasis   . Snoring 04/27/2014     Past Surgical History:  Procedure Laterality Date  . KNEE ARTHROSCOPY Right 01/31/2014   Procedure: RIGHT ARTHROSCOPY KNEE WITH DEBRIDMENT CHONDROPLASTY;  Surgeon: [name], MD;  Location: [facility];  Service: Orthopedics;  Laterality: Right;  . LUMP REMOVED FROM LEFT BREAST Left 2008   STATES SURGERY  AT [facility] - LOCAL- NOT CANCER 5 OR MORE YRS AGO  . MORTON'S NEUROMA REMOVED    . TONSILLECTOMY     AS A CHILD     Social History   Tobacco Use  Smoking Status Former Smoker  . Types: Cigarettes  Smokeless Tobacco Never Used  Tobacco Comment   quit around 2002    Social History   Substance and Sexual Activity  Alcohol Use No   Comment: QUIT SMOKING ABOUT 15 YRS AGO--WAS ONLY 2 OR 3 CIGARETTES WITH A BEER ON WEEK ENDS     Family History  Problem Relation Age of Onset  . Diabetes Mother   . Hypertension Mother   . Stroke Father   . Hypertension Father      Current Outpatient Medications on File  Prior to Visit  Medication Sig Dispense Refill  . brompheniramine-pseudoephedrine-DM 30-2-10 MG/5ML syrup brompheniramine-pseudoephedrine-DM 2 mg-30 mg-10 mg/5 mL oral syrup    . CALCIUM-MAGNESIUM-ZINC PO Take 1 tablet by mouth 2 (two) times daily.     . cefUROXime (CEFTIN) 500 MG tablet TK 1 T PO Q 12 H FOR 7 DAYS    . cholecalciferol (VITAMIN D) 1000 UNITS tablet Take 1,000 Units by mouth daily.     . cyclobenzaprine (FLEXERIL) 10 MG tablet Take 10 mg by mouth 3 (three) times daily as needed for muscle spasms (BACK).     . Glucosamine-Chondroit-Vit C-Mn (GLUCOSAMINE CHONDR 1500 COMPLX PO) Take 1 tablet by mouth 2 (two) times daily.    Marland Kitchen HYDROcodone-acetaminophen (NORCO/VICODIN) 5-325 MG per tablet Take 1-2 tablets by mouth every 4 (four) hours as needed for moderate pain. 6 AM AND 12NOON AND 6 PM 50 tablet 0  . hydroxychloroquine (PLAQUENIL) 200 MG tablet Take 200 mg by mouth 2 (two) times daily.    Marland Kitchen lisinopril-hydrochlorothiazide (PRINZIDE,ZESTORETIC) 10-12.5 MG per tablet Take 1 tablet by mouth every morning.    . Lutein-Zeaxanthin 25-5 MG CAPS Take by mouth.    . misoprostol (CYTOTEC) 100 MCG tablet Take 100 mcg by mouth 2 (two)  times daily.    . Multiple Vitamin (MULTIVITAMIN WITH MINERALS) TABS tablet Take 1 tablet by mouth daily.    . Multiple Vitamin (MULTIVITAMIN) tablet Take 1 tablet by mouth daily.    . mupirocin ointment (BACTROBAN) 2 % mupirocin 2 % topical ointment    . Omega-3 Fatty Acids (FISH OIL) 1000 MG CAPS Take 1,000 mg by mouth 2 (two) times daily.    Marland Kitchen omeprazole (PRILOSEC) 40 MG capsule TK ONE C PO  ONCE A DAY    . OVER THE COUNTER MEDICATION Take 1 tablet by mouth daily. lutein    . pravastatin (PRAVACHOL) 20 MG tablet Take 20 mg by mouth daily.    . promethazine (PHENERGAN) 6.25 MG/5ML syrup promethazine 6.25 mg/5 mL oral syrup  TK 10 ML PO Q 6 H FOR 10 DAYS PRN    . sulindac (CLINORIL) 200 MG tablet Take 200 mg by mouth 2 (two) times daily.    Marland Kitchen triamcinolone cream  (KENALOG) 0.1 % Apply 1 application topically daily. Apply to psoriasis rash 30 g 1  . urea (CARMOL) 40 % CREA APPLY AA TOPICALLY HS    . vitamin B-12 (CYANOCOBALAMIN) 1000 MCG tablet Take 1,000 mcg by mouth daily.     No current facility-administered medications on file prior to visit.    Cardiovascular and other pertinent studies:   EKG 07/26/2019: Sinus rhythm 61 bpm.  First degree A-V block. Two PAC's. Normal QTc interval.    Recent labs: 05/31/2019: Glucose 80, BUN/Cr 16/0.9. EGFR normal. Na/K 144/4.2. Rest of the CMP normal H/H 12.9/36.6. MCV 85.5. Platelets 251 HbA1C 5.5% Chol 162, TG 35, HDL 77, LDL 78    Review of Systems  Cardiovascular: Positive for dyspnea on exertion and palpitations. Negative for chest pain, leg swelling and syncope.  Musculoskeletal: Positive for arthritis.         Vitals:   07/26/19 0842  BP: 118/68  Pulse: (!) 53  SpO2: 99%     Body mass index is 24.59 kg/m. Filed Weights   07/26/19 0842  Weight: 166 lb 8 oz (75.5 kg)     Objective:   Physical Exam  Constitutional: He appears well-developed and well-nourished.  Neck: No JVD present.  Cardiovascular: Normal rate, regular rhythm, normal heart sounds and intact distal pulses. Frequent extrasystoles are present.  No murmur heard. Pulmonary/Chest: Effort normal and breath sounds normal. He has no wheezes. He has no rales.  Musculoskeletal:        General: No edema.  Nursing note and vitals reviewed.       Assessment & Recommendations:   74 y.o. Caucasian male with hypertension, hyperlipidemia, rheumatoid arthritis SVT, NSVT.  Arrhythmia: 11% PAC burden, with SVT,m PAC, couplets. Only one episode of 5 beat NSVT.  He is on hydroxychloroquine, but QTc interval is normal.  I think arrhythmia is primarily driven by supraventricular activity. This may secondary to be Covid effects.  Sleep disordered breathing should also be considered as a possible trigger. My suspicion for  ischemia is low.    I started him on metoprolol tartrate 12.5 mg twice daily.  Will obtain echocardiogram.  We will repeat 48-hour Holter monitor in 4 weeks to assess PAC burden.0  Thank you for referring the patient to [place]. Please feel free to contact with any questions.  Nigel Mormon, MD [facility] Pager: [phone] Office: [phone]

## 2019-07-26 ENCOUNTER — Ambulatory Visit: Payer: BC Managed Care – PPO | Admitting: Cardiology

## 2019-07-26 ENCOUNTER — Encounter: Payer: Self-pay | Admitting: Cardiology

## 2019-07-26 ENCOUNTER — Other Ambulatory Visit: Payer: Self-pay

## 2019-07-26 VITALS — BP 118/68 | HR 53 | Ht 69.0 in | Wt 166.5 lb

## 2019-07-26 DIAGNOSIS — I471 Supraventricular tachycardia: Secondary | ICD-10-CM

## 2019-07-26 DIAGNOSIS — I491 Atrial premature depolarization: Secondary | ICD-10-CM

## 2019-07-26 DIAGNOSIS — I10 Essential (primary) hypertension: Secondary | ICD-10-CM

## 2019-07-26 DIAGNOSIS — I472 Ventricular tachycardia: Secondary | ICD-10-CM

## 2019-07-26 DIAGNOSIS — E782 Mixed hyperlipidemia: Secondary | ICD-10-CM | POA: Diagnosis not present

## 2019-07-26 DIAGNOSIS — I4729 Other ventricular tachycardia: Secondary | ICD-10-CM

## 2019-07-26 MED ORDER — METOPROLOL TARTRATE 25 MG PO TABS: 25.0000 mg | ORAL_TABLET | Freq: Two times a day (BID) | ORAL | 2 refills | Status: DC

## 2019-07-31 ENCOUNTER — Other Ambulatory Visit: Payer: Self-pay

## 2019-07-31 ENCOUNTER — Ambulatory Visit (INDEPENDENT_AMBULATORY_CARE_PROVIDER_SITE_OTHER): Payer: BC Managed Care – PPO

## 2019-07-31 DIAGNOSIS — I491 Atrial premature depolarization: Secondary | ICD-10-CM

## 2019-07-31 DIAGNOSIS — I471 Supraventricular tachycardia: Secondary | ICD-10-CM

## 2019-07-31 DIAGNOSIS — I4729 Other ventricular tachycardia: Secondary | ICD-10-CM

## 2019-07-31 DIAGNOSIS — I472 Ventricular tachycardia: Secondary | ICD-10-CM | POA: Diagnosis not present

## 2019-08-08 ENCOUNTER — Other Ambulatory Visit: Payer: Self-pay

## 2019-08-08 ENCOUNTER — Ambulatory Visit: Payer: BC Managed Care – PPO

## 2019-08-08 DIAGNOSIS — I491 Atrial premature depolarization: Secondary | ICD-10-CM

## 2019-08-08 DIAGNOSIS — I471 Supraventricular tachycardia: Secondary | ICD-10-CM

## 2019-08-08 DIAGNOSIS — I4729 Other ventricular tachycardia: Secondary | ICD-10-CM

## 2019-08-08 DIAGNOSIS — I472 Ventricular tachycardia: Secondary | ICD-10-CM

## 2019-08-11 DIAGNOSIS — M170 Bilateral primary osteoarthritis of knee: Secondary | ICD-10-CM | POA: Diagnosis not present

## 2019-08-14 DIAGNOSIS — I472 Ventricular tachycardia: Secondary | ICD-10-CM | POA: Diagnosis not present

## 2019-08-14 DIAGNOSIS — I471 Supraventricular tachycardia: Secondary | ICD-10-CM | POA: Diagnosis not present

## 2019-08-14 DIAGNOSIS — I491 Atrial premature depolarization: Secondary | ICD-10-CM | POA: Diagnosis not present

## 2019-08-18 DIAGNOSIS — M170 Bilateral primary osteoarthritis of knee: Secondary | ICD-10-CM | POA: Diagnosis not present

## 2019-08-23 ENCOUNTER — Ambulatory Visit: Payer: BC Managed Care – PPO | Admitting: Cardiology

## 2019-08-23 ENCOUNTER — Encounter: Payer: Self-pay | Admitting: Cardiology

## 2019-08-23 ENCOUNTER — Other Ambulatory Visit: Payer: Self-pay

## 2019-08-23 VITALS — BP 123/70 | HR 64 | Temp 97.9°F | Resp 16 | Ht 69.0 in | Wt 166.7 lb

## 2019-08-23 DIAGNOSIS — I472 Ventricular tachycardia: Secondary | ICD-10-CM

## 2019-08-23 DIAGNOSIS — I491 Atrial premature depolarization: Secondary | ICD-10-CM | POA: Insufficient documentation

## 2019-08-23 DIAGNOSIS — I4729 Other ventricular tachycardia: Secondary | ICD-10-CM

## 2019-08-23 DIAGNOSIS — I440 Atrioventricular block, first degree: Secondary | ICD-10-CM | POA: Diagnosis not present

## 2019-08-23 DIAGNOSIS — I471 Supraventricular tachycardia: Secondary | ICD-10-CM

## 2019-08-23 NOTE — Progress Notes (Signed)
Patient referred by Merrilee Seashore, MD for SVT, NSVT, PAC  Subjective:   Richard [name], male    DOB: [birth year], 74 y.o.   MRN: [id number]   Chief Complaint  Patient presents with  . Palpitations  . Results    Echocardiogram    HPI  74 y.o. Caucasian male with hypertension, hyperlipidemia, rheumatoid arthritis, PAC's, atrial tachycardia.  48-hour Holter monitor showed 38% PAC burden, and PVCs.  He also had rare episodes of bradycardia in 30s and blocked PACs, mostly during sleep hours.  He is feeling well and denies any palpitations, presyncope, syncope symptoms. He has occasional shortness of breath, which has persistent ever since his COVID in spring 2021.    Initial consultation HPI 07/26/19: Patient contracted Covid in March 2020.  Ever since, he has had lingering cough, shortness of breath with exertion.  He is also had episodes of palpitations which are short lasting. Patient recently underwent Zio patch Holter monitor, that showed 11% PAC burden with frequent PAC/SVAT, and one episode of 5 beat ventricular tachycardia. He reportedly also had episodes of SVT.    Current Outpatient Medications on File Prior to Visit  Medication Sig Dispense Refill  . brompheniramine-pseudoephedrine-DM 30-2-10 MG/5ML syrup 3 (three) times daily as needed.     Marland Kitchen CALCIUM-MAGNESIUM-ZINC PO Take 1 tablet by mouth 2 (two) times daily.     . cholecalciferol (VITAMIN D) 1000 UNITS tablet Take 1,000 Units by mouth daily.     . cyclobenzaprine (FLEXERIL) 10 MG tablet Take 10 mg by mouth 3 (three) times daily as needed for muscle spasms (BACK).     Marland Kitchen HYDROcodone-acetaminophen (NORCO/VICODIN) 5-325 MG per tablet Take 1-2 tablets by mouth every 4 (four) hours as needed for moderate pain. 6 AM AND 12NOON AND 6 PM 50 tablet 0  . hydroxychloroquine (PLAQUENIL) 200 MG tablet Take 200 mg by mouth 2 (two) times daily.    Marland Kitchen lisinopril-hydrochlorothiazide (PRINZIDE,ZESTORETIC) 10-12.5 MG per tablet Take 1  tablet by mouth every morning.    . Lutein-Zeaxanthin 25-5 MG CAPS Take by mouth.    . metoprolol tartrate (LOPRESSOR) 25 MG tablet Take 1 tablet (25 mg total) by mouth 2 (two) times daily. 30 tablet 2  . misoprostol (CYTOTEC) 100 MCG tablet Take 100 mcg by mouth 2 (two) times daily.    . Multiple Vitamin (MULTIVITAMIN WITH MINERALS) TABS tablet Take 1 tablet by mouth daily.    . Omega-3 Fatty Acids (FISH OIL) 1000 MG CAPS Take 1,000 mg by mouth daily.     Marland Kitchen omeprazole (PRILOSEC) 40 MG capsule TK ONE C PO  ONCE A DAY    . pravastatin (PRAVACHOL) 20 MG tablet Take 20 mg by mouth daily.    . promethazine (PHENERGAN) 6.25 MG/5ML syrup every 4 (four) hours as needed.     . sulindac (CLINORIL) 200 MG tablet Take 200 mg by mouth 2 (two) times daily.    Marland Kitchen triamcinolone cream (KENALOG) 0.1 % Apply 1 application topically daily. Apply to psoriasis rash 30 g 1  . vitamin B-12 (CYANOCOBALAMIN) 1000 MCG tablet Take 1,000 mcg by mouth daily.     No current facility-administered medications on file prior to visit.    Cardiovascular and other pertinent studies:  EKG 08/23/2019: Sinus  Rhythm 65 bpm. First degree A-V block. Frequent PAC s.  Nonspecific T wave abnormality.  Echocardiogram 07/31/2019:  Left ventricle cavity is normal in size and wall thickness. Normal global  wall motion. Normal LV systolic function with visual EF 50-55%.  Normal  diastolic filling pattern.  Mild bileaflet prolapse with mild to moderate mitral regurgitation.  Mild tricuspid regurgitation. Estimated pulmonary artery systolic pressure  is 25 mmHg.  Compared to previous study in 2017, LVEF lower from 70% to 50-55%. LVEF  assessment limited due to frequent ectopy. Recommend clinical  correlation.   48 hr Holter monitor 08/08/2019: Dominant rhythm Sinus. HR 34-109 bpm. Avg HR 68 bpm. <1% ventricualr ectopy 38% supraventricular ectopy in the form of PAC's, atrial couplets, atrial bigeminy, atrial run. Bradycardia  episodes in 30s during sleep hours. 2.2 sec pause likely from blocked PAC.  No atrial fibrillation/atrial flutter/SVT/VT/high grade AV block, sinus pause >3sec noted. Symptoms reported: None   Recent labs: 05/31/2019: Glucose 80, BUN/Cr 16/0.9. EGFR normal. Na/K 144/4.2. Rest of the CMP normal H/H 12.9/36.6. MCV 85.5. Platelets 251 HbA1C 5.5% Chol 162, TG 35, HDL 77, LDL 78    Review of Systems  Cardiovascular: Positive for dyspnea on exertion and palpitations. Negative for chest pain, leg swelling and syncope.  Musculoskeletal: Positive for arthritis.         Vitals:   08/23/19 1209  BP: 123/70  Pulse: 64  Resp: 16  Temp: 97.9 F (36.6 C)  SpO2: 99%     Body mass index is 24.62 kg/m. Filed Weights   08/23/19 1209  Weight: 166 lb 11.2 oz (75.6 kg)     Objective:   Physical Exam  Constitutional: He appears well-developed and well-nourished.  Neck: No JVD present.  Cardiovascular: Normal rate, regular rhythm, normal heart sounds and intact distal pulses. Frequent extrasystoles are present.  No murmur heard. Pulmonary/Chest: Effort normal and breath sounds normal. He has no wheezes. He has no rales.  Musculoskeletal:        General: No edema.  Nursing note and vitals reviewed.       Assessment & Recommendations:   74 y.o. Caucasian male with hypertension, hyperlipidemia, rheumatoid arthritis, PAC's, atrial tachycardia.  Arrhythmia: 38% supraventricular burden with PAC,s atrial bigeminy, atrial tachycardia. Night time sinus bradycardia in 30s.  In spite of high PAC burden, he is quite asymptomatic. Thus, I do not recommend any pharmacological treatment at this time. Should he develop any symptoms in the future, recommend pindolol at 5 mg bid- which is a beta blocker with intrinsic sympathomimetic properties-in order to avoid profound bradycardia.   F/u in 6 months   Manish Esther Hardy, MD [facility] [place] Pager: [phone] Office:  [phone]

## 2019-08-24 ENCOUNTER — Encounter: Payer: Self-pay | Admitting: Cardiology

## 2019-08-24 DIAGNOSIS — I440 Atrioventricular block, first degree: Secondary | ICD-10-CM | POA: Insufficient documentation

## 2019-08-25 DIAGNOSIS — M170 Bilateral primary osteoarthritis of knee: Secondary | ICD-10-CM | POA: Diagnosis not present

## 2019-08-31 DIAGNOSIS — F1120 Opioid dependence, uncomplicated: Secondary | ICD-10-CM | POA: Diagnosis not present

## 2019-08-31 DIAGNOSIS — Z79899 Other long term (current) drug therapy: Secondary | ICD-10-CM | POA: Insufficient documentation

## 2019-08-31 DIAGNOSIS — M47817 Spondylosis without myelopathy or radiculopathy, lumbosacral region: Secondary | ICD-10-CM | POA: Diagnosis not present

## 2019-09-06 ENCOUNTER — Telehealth: Payer: BC Managed Care – PPO | Admitting: Cardiology

## 2019-09-09 ENCOUNTER — Other Ambulatory Visit: Payer: Self-pay | Admitting: Cardiology

## 2019-09-09 DIAGNOSIS — I4729 Other ventricular tachycardia: Secondary | ICD-10-CM

## 2019-09-09 DIAGNOSIS — I471 Supraventricular tachycardia: Secondary | ICD-10-CM

## 2019-09-09 DIAGNOSIS — I472 Ventricular tachycardia: Secondary | ICD-10-CM

## 2019-09-09 DIAGNOSIS — I491 Atrial premature depolarization: Secondary | ICD-10-CM

## 2019-09-12 DIAGNOSIS — M2550 Pain in unspecified joint: Secondary | ICD-10-CM | POA: Diagnosis not present

## 2019-09-12 DIAGNOSIS — L409 Psoriasis, unspecified: Secondary | ICD-10-CM | POA: Diagnosis not present

## 2019-09-12 DIAGNOSIS — L4059 Other psoriatic arthropathy: Secondary | ICD-10-CM | POA: Diagnosis not present

## 2019-09-12 DIAGNOSIS — M150 Primary generalized (osteo)arthritis: Secondary | ICD-10-CM | POA: Diagnosis not present

## 2019-09-19 ENCOUNTER — Ambulatory Visit: Payer: BC Managed Care – PPO | Admitting: Podiatry

## 2019-09-25 ENCOUNTER — Other Ambulatory Visit: Payer: Self-pay

## 2019-09-25 ENCOUNTER — Ambulatory Visit (INDEPENDENT_AMBULATORY_CARE_PROVIDER_SITE_OTHER): Payer: BC Managed Care – PPO | Admitting: Podiatry

## 2019-09-25 DIAGNOSIS — Q828 Other specified congenital malformations of skin: Secondary | ICD-10-CM | POA: Diagnosis not present

## 2019-09-25 DIAGNOSIS — M79674 Pain in right toe(s): Secondary | ICD-10-CM | POA: Diagnosis not present

## 2019-09-25 DIAGNOSIS — M79675 Pain in left toe(s): Secondary | ICD-10-CM

## 2019-09-25 DIAGNOSIS — B351 Tinea unguium: Secondary | ICD-10-CM

## 2019-10-01 NOTE — Progress Notes (Signed)
Subjective: 74 y.o. returns the office today for painful, elongated, thickened toenails which he cannot trim himself as well as for calluses to both of the second toes distally. Denies any redness or drainage around the nails.  No other concerns. Denies any systemic complaints such as fevers, chills, nausea, vomiting.   PCP: Merrilee Seashore, MD  Objective: AAO 3, NAD DP/PT pulses palpable, CRT less than 3 seconds Nails hypertrophic, dystrophic, elongated, brittle, discolored 10. There is tenderness overlying the nails 1-5 bilaterally. There is no surrounding erythema or drainage along the nail sites. Hyperkeratotic lesion of the distal aspect of bilateral second toes.  Upon debridement no ongoing ulceration or drainage or signs of infection.  Psoriasis unchanged to the digits on the right foot.   No open lesions or pre-ulcerative lesions are identified. No other areas of tenderness bilateral lower extremities. No overlying edema, erythema, increased warmth. No pain with calf compression, swelling, warmth, erythema.  Assessment: Patient presents with symptomatic onychomycosis, hyperkeratotic lesions; psoriasis  Plan: -Treatment options including alternatives, risks, complications were discussed -Nails sharply debrided 10 without complication/bleeding. -Hyperkeratotic lesion sharply.  X2 without any complications or bleeding.  -Discussed daily foot inspection. If there are any changes, to call the office immediately.  -Follow-up in 3 months or sooner if any problems are to arise. In the meantime, encouraged to call the office with any questions, concerns, changes symptoms.  Celesta Gentile, DPM

## 2019-10-13 DIAGNOSIS — R0781 Pleurodynia: Secondary | ICD-10-CM | POA: Diagnosis not present

## 2019-10-13 DIAGNOSIS — M25519 Pain in unspecified shoulder: Secondary | ICD-10-CM | POA: Diagnosis not present

## 2019-10-17 DIAGNOSIS — R079 Chest pain, unspecified: Secondary | ICD-10-CM | POA: Diagnosis not present

## 2019-10-17 DIAGNOSIS — R0781 Pleurodynia: Secondary | ICD-10-CM | POA: Diagnosis not present

## 2019-10-17 DIAGNOSIS — S20212A Contusion of left front wall of thorax, initial encounter: Secondary | ICD-10-CM | POA: Diagnosis not present

## 2019-10-17 DIAGNOSIS — I10 Essential (primary) hypertension: Secondary | ICD-10-CM | POA: Diagnosis not present

## 2019-10-17 DIAGNOSIS — L4050 Arthropathic psoriasis, unspecified: Secondary | ICD-10-CM | POA: Diagnosis not present

## 2019-11-21 DIAGNOSIS — M069 Rheumatoid arthritis, unspecified: Secondary | ICD-10-CM | POA: Diagnosis not present

## 2019-11-28 DIAGNOSIS — M47817 Spondylosis without myelopathy or radiculopathy, lumbosacral region: Secondary | ICD-10-CM | POA: Diagnosis not present

## 2019-11-28 DIAGNOSIS — F1120 Opioid dependence, uncomplicated: Secondary | ICD-10-CM | POA: Diagnosis not present

## 2019-11-28 DIAGNOSIS — Z79899 Other long term (current) drug therapy: Secondary | ICD-10-CM | POA: Diagnosis not present

## 2019-11-29 ENCOUNTER — Telehealth: Payer: Self-pay | Admitting: *Deleted

## 2019-11-29 NOTE — Telephone Encounter (Signed)
Need more information about what surgery it is.  Thanks MJP

## 2019-11-29 NOTE — Telephone Encounter (Signed)
Richard [name] is a patient of [facility].  I will have his preoperative cardiac clearance sent to their office for cardiac clearance.  He will need clearance from their practice.

## 2019-11-29 NOTE — Telephone Encounter (Signed)
Hi, can you please look into this clearance?  Looks like it was sent to [facility] by accident and it is now being sent to [place].  Patient is not scheduled for surgery yet per Veronda Prude, LPN.    Thank you! -Leda Quail

## 2019-11-29 NOTE — Telephone Encounter (Signed)
[facility] PA Address: [address], [place], [place] [address] Phone: [phone]  [facility] cardiovascular s/w operator and she states that they do have Epic and forward message to [employer]. Message forwarded.

## 2019-11-29 NOTE — Telephone Encounter (Signed)
   [facility] Pre-operative Risk Assessment    HEARTCARE STAFF: - Please ensure there is not already an duplicate clearance open for this procedure. - Under Visit Info/Reason for Call, type in Other and utilize the format Clearance MM/DD/YY or Clearance TBD. Do not use dashes or single digits. - If request is for dental extraction, please clarify the # of teeth to be extracted.  Request for surgical clearance:  1. What type of surgery is being performed? LEFT INGUINAL HERNIA REPAIR   2. When is this surgery scheduled? TBD   3. What type of clearance is required (medical clearance vs. Pharmacy clearance to hold med vs. Both)? MEDICAL   4. Are there any medications that need to be held prior to surgery and how long? NONE LISTED    5. Practice name and name of physician performing surgery? [facility]; DR. Rodman [place]   6. What is the office phone number? [phone]   7.   What is the office fax number?  Richard [name], CMA  8.   Anesthesia type (None, local, MAC, general) ? GENERAL   Julaine Hua 11/29/2019, 9:42 AM  _________________________________________________________________   (provider comments below)

## 2019-11-30 NOTE — Telephone Encounter (Signed)
Dr Fraser Din please see below surgery details. Let me know should you need more information- thank you!    Request for surgical clearance:   What type of surgery is being performed? LEFT INGUINAL HERNIA REPAIR    When is this surgery scheduled? TBD    What type of clearance is required (medical clearance vs. Pharmacy clearance to hold med vs. Both)? MEDICAL    Are there any medications that need to be held prior to surgery and how long? NONE LISTED     Practice name and name of physician performing surgery? [facility]; DR. Rolm Bookbinder    What is the office phone number? [phone]   7.   What is the office fax number?  [place], CMA   8.   Anesthesia type (None, local, MAC, general) ? GENERAL

## 2019-12-01 ENCOUNTER — Telehealth: Payer: Self-pay | Admitting: Internal Medicine

## 2019-12-01 DIAGNOSIS — M1712 Unilateral primary osteoarthritis, left knee: Secondary | ICD-10-CM | POA: Diagnosis not present

## 2019-12-01 NOTE — Telephone Encounter (Signed)
Pt called to check the status of the referral, has not received.

## 2019-12-06 NOTE — Telephone Encounter (Signed)
I can stamp your signature and fax back- please advice.

## 2019-12-06 NOTE — Telephone Encounter (Signed)
Low cardiac risk. Okay to proceed with surgery. Happy to sign pre-op letter.  Thanks MJP

## 2019-12-07 NOTE — Telephone Encounter (Signed)
Alisha from [facility] refaxing referral to chart prep fax.

## 2020-01-11 DIAGNOSIS — E782 Mixed hyperlipidemia: Secondary | ICD-10-CM | POA: Diagnosis not present

## 2020-01-17 NOTE — Progress Notes (Signed)
Referring-Matthew Donne Hazel, MD Reason for referral-preoperative evaluation prior to hernia repair  HPI: 74 year old male for preoperative evaluation prior to hernia repair at request of Rolm Bookbinder MD.  Previously followed by Dr. Virgina Jock.  Patient had Covid March 2020 and has had difficulties with lingering cough and dyspnea.  Previous monitor showed sinus rhythm with PACs, SVT, couplets and 5 beats nonsustained ventricular tachycardia.  Echocardiogram January 2021 showed normal LV function, mild bileaflet mitral valve prolapse with mild to moderate mitral regurgitation, mild tricuspid regurgitation.  Holter monitor February 2021 showed sinus rhythm with PACs, couplets, bigeminy, atrial run and bradycardia during early a.m. hours.  Patient is scheduled to have hernia repair which will require general anesthesia and cardiology asked to evaluate preoperatively.  Since having Covid approximately 1-1/2 years ago he has had some fatigue with activities as well as dyspnea with more vigorous activities relieved with rest.  This is slowly improving.  He can walk 15 to 20 minutes at a time with having no dyspnea.  He can also climb stairs with no dyspnea.  He denies chest pain, palpitations or syncope.  Current Outpatient Medications  Medication Sig Dispense Refill  . CALCIUM-MAGNESIUM-ZINC PO Take 1 tablet by mouth 2 (two) times daily.     . cholecalciferol (VITAMIN D) 1000 UNITS tablet Take 1,000 Units by mouth daily.     . cyclobenzaprine (FLEXERIL) 10 MG tablet Take 10 mg by mouth 3 (three) times daily as needed for muscle spasms (BACK).     . hydroxychloroquine (PLAQUENIL) 200 MG tablet Take 200 mg by mouth 2 (two) times daily.    Marland Kitchen lisinopril-hydrochlorothiazide (PRINZIDE,ZESTORETIC) 10-12.5 MG per tablet Take 1 tablet by mouth every morning.    . Lutein-Zeaxanthin 25-5 MG CAPS Take by mouth.    . metoprolol tartrate (LOPRESSOR) 25 MG tablet TAKE 1 TABLET(25 MG) BY MOUTH TWICE DAILY 180  tablet 1  . misoprostol (CYTOTEC) 100 MCG tablet Take 100 mcg by mouth 2 (two) times daily.    . Multiple Vitamin (MULTIVITAMIN WITH MINERALS) TABS tablet Take 1 tablet by mouth daily.    . Omega-3 Fatty Acids (FISH OIL) 1000 MG CAPS Take 1,000 mg by mouth daily.     Marland Kitchen omeprazole (PRILOSEC) 40 MG capsule TK ONE C PO  ONCE A DAY    . pravastatin (PRAVACHOL) 20 MG tablet Take 20 mg by mouth daily.    . sulindac (CLINORIL) 200 MG tablet Take 200 mg by mouth 2 (two) times daily.    Marland Kitchen triamcinolone cream (KENALOG) 0.1 % Apply 1 application topically daily. Apply to psoriasis rash 30 g 1  . vitamin B-12 (CYANOCOBALAMIN) 1000 MCG tablet Take 1,000 mcg by mouth daily.     No current facility-administered medications for this visit.    Allergies  Allergen Reactions  . Azithromycin Diarrhea and Nausea And Vomiting  . Latex     WHEN PT USED TO WEAR LATEX GLOVES AT WORK HE NOTICED SKIN REDNESS AND ITCHING OF HANDS.  Marland Kitchen Prednisone Other (See Comments)    "makes me angry and I feel weird."     Past Medical History:  Diagnosis Date  . 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 Back pain   . GERD (gastroesophageal reflux disease)   . Headache   . Hypertension   . Pain    PAIN AND OA RT KNEE  . Pneumonia    YEARS AGO  . Psoriasis   . Snoring 04/27/2014  Past Surgical History:  Procedure Laterality Date  . KNEE ARTHROSCOPY Right 01/31/2014   Procedure: RIGHT ARTHROSCOPY KNEE WITH DEBRIDMENT CHONDROPLASTY;  Surgeon: [name], MD;  Location: [facility];  Service: Orthopedics;  Laterality: Right;  . LUMP REMOVED FROM LEFT BREAST Left 2008   STATES SURGERY  AT [facility] - LOCAL- NOT CANCER 5 OR MORE YRS AGO  . MORTON'S NEUROMA REMOVED    . TONSILLECTOMY     AS A CHILD    Social History   Socioeconomic History  . Marital status: Married    Spouse name: Not on file  . Number of children: 0  . Years of education: Not on file  . Highest education level: Not on file    Occupational History  . Not on file  Tobacco Use  . Smoking status: Former Smoker    Packs/day: 0.25    Types: Cigarettes    Quit date: 1990    Years since quitting: 31.5  . Smokeless tobacco: Never Used  Vaping Use  . Vaping Use: Never used  Substance and Sexual Activity  . Alcohol use: Yes    Comment: QUIT SMOKING ABOUT 15 YRS AGO--WAS ONLY 2 OR 3 CIGARETTES WITH A BEER ON WEEK ENDS  . Drug use: No  . Sexual activity: Not on file  Other Topics Concern  . Not on file  Social History Narrative   Right handed, caffeine 2-3 cups daily, married, no kids, has baby bird.  WC back injury, so not working at this time.  BS in econmics.   Social Determinants of Health   Financial Resource Strain:   . Difficulty of Paying Living Expenses:   Food Insecurity:   . Worried About Charity fundraiser in the Last Year:   . Arboriculturist in the Last Year:   Transportation Needs:   . Film/video editor (Medical):   Marland Kitchen Lack of Transportation (Non-Medical):   Physical Activity:   . Days of Exercise per Week:   . Minutes of Exercise per Session:   Stress:   . Feeling of Stress :   Social Connections:   . Frequency of Communication with Friends and Family:   . Frequency of Social Gatherings with Friends and Family:   . Attends Religious Services:   . Active Member of Clubs or Organizations:   . Attends Archivist Meetings:   Marland Kitchen Marital Status:   Intimate Partner Violence:   . Fear of Current or Ex-Partner:   . Emotionally Abused:   Marland Kitchen Physically Abused:   . Sexually Abused:     Family History  Problem Relation Age of Onset  . Diabetes Mother   . Hypertension Mother   . Heart disease Mother        has a stent   . Stroke Father 88  . Hypertension Father     ROS: Arthralgias but no fevers or chills, productive cough, hemoptysis, dysphasia, odynophagia, melena, hematochezia, dysuria, hematuria, rash, seizure activity, orthopnea, PND, pedal edema, claudication.  Remaining systems are negative.  Physical Exam:   Blood pressure 112/74, pulse (!) 107, height 5\' 9"  (1.753 m), weight 161 lb 6.4 oz (73.2 kg), SpO2 100 %.  General:  Well developed/well nourished in NAD Skin warm/dry Patient not depressed No peripheral clubbing Back-normal HEENT-normal/normal eyelids Neck supple/normal carotid upstroke bilaterally; no bruits; no JVD; no thyromegaly chest - CTA/ normal expansion CV - irregular/normal S1 and S2; no murmurs, rubs or gallops;  PMI nondisplaced Abdomen -NT/ND, no HSM,  no mass, + bowel sounds, positive bruit 2+ femoral pulses, no bruits Ext-no edema, chords, 2+ DP Neuro-grossly nonfocal  ECG -atrial fibrillation at a rate of 107, nonspecific ST changes.  Personally reviewed  A/P  1 preoperative evaluation prior to hernia repair-patient is very functional and has no significant chest pain.  He did have some dyspnea on exertion after contracting Covid but this is slowly improving.  He can now ambulate 15 to 20 minutes at a time with having no symptoms.  He may proceed with hernia repair without further cardiac evaluation.  Note recent echocardiogram showed normal LV function.  2 persistent atrial fibrillation-patient has newly diagnosed atrial fibrillation.  He has some dyspnea on exertion and fatigue which has been present since he contracted Covid.  I cannot exclude atrial fibrillation contributing to his symptoms. CHADSvasc 2.  We will add apixaban 5 mg twice daily.  I have asked him to contact his primary care physician concerning his NSAID for arthritis.  I would like for him to be on a different medication if possible given potential interaction with apixaban.  Recent echocardiogram showed normal LV function.  Check TSH.  We will discontinue his apixaban 2 days prior to hernia repair.  I will see him back after that and we will make a decision at that time concerning rate control versus rhythm control.  His heart rate is 106 today but he has  had some bradycardia noted on previous monitors and his electrocardiogram at baseline shows sinus bradycardia with first-degree AV block.  If we elect rate control we will likely place a monitor in the future to make sure that he does not require additional AV nodal blocking agents.  3 hypertension-patient's blood pressure is controlled.  Continue present medications and follow.  4 history of mild to moderate mitral regurgitation-he will need a follow-up echocardiogram January 2022.  5 abdominal bruit-schedule ultrasound to exclude aneurysm.  6 Hyperlipidemia-continue statin.  Kirk Ruths, MD

## 2020-01-18 DIAGNOSIS — L821 Other seborrheic keratosis: Secondary | ICD-10-CM | POA: Diagnosis not present

## 2020-01-18 DIAGNOSIS — M79671 Pain in right foot: Secondary | ICD-10-CM | POA: Diagnosis not present

## 2020-01-18 DIAGNOSIS — L4050 Arthropathic psoriasis, unspecified: Secondary | ICD-10-CM | POA: Diagnosis not present

## 2020-01-18 DIAGNOSIS — L309 Dermatitis, unspecified: Secondary | ICD-10-CM | POA: Diagnosis not present

## 2020-01-18 DIAGNOSIS — R05 Cough: Secondary | ICD-10-CM | POA: Diagnosis not present

## 2020-01-25 ENCOUNTER — Other Ambulatory Visit: Payer: Self-pay

## 2020-01-25 ENCOUNTER — Encounter: Payer: Self-pay | Admitting: Podiatry

## 2020-01-25 ENCOUNTER — Ambulatory Visit (INDEPENDENT_AMBULATORY_CARE_PROVIDER_SITE_OTHER): Payer: BC Managed Care – PPO | Admitting: Podiatry

## 2020-01-25 DIAGNOSIS — M79674 Pain in right toe(s): Secondary | ICD-10-CM

## 2020-01-25 DIAGNOSIS — M79675 Pain in left toe(s): Secondary | ICD-10-CM | POA: Diagnosis not present

## 2020-01-25 DIAGNOSIS — B351 Tinea unguium: Secondary | ICD-10-CM

## 2020-01-29 ENCOUNTER — Encounter: Payer: Self-pay | Admitting: Cardiology

## 2020-01-29 ENCOUNTER — Other Ambulatory Visit: Payer: Self-pay

## 2020-01-29 ENCOUNTER — Ambulatory Visit: Payer: BC Managed Care – PPO | Admitting: Cardiology

## 2020-01-29 VITALS — BP 112/74 | HR 107 | Ht 69.0 in | Wt 161.4 lb

## 2020-01-29 DIAGNOSIS — R0989 Other specified symptoms and signs involving the circulatory and respiratory systems: Secondary | ICD-10-CM | POA: Diagnosis not present

## 2020-01-29 DIAGNOSIS — I10 Essential (primary) hypertension: Secondary | ICD-10-CM

## 2020-01-29 DIAGNOSIS — I4891 Unspecified atrial fibrillation: Secondary | ICD-10-CM | POA: Diagnosis not present

## 2020-01-29 DIAGNOSIS — Z01810 Encounter for preprocedural cardiovascular examination: Secondary | ICD-10-CM | POA: Diagnosis not present

## 2020-01-29 LAB — CBC
Hematocrit: 41.2 % (ref 37.5–51.0)
Hemoglobin: 13 g/dL (ref 13.0–17.7)
MCH: 27 pg (ref 26.6–33.0)
MCHC: 31.6 g/dL (ref 31.5–35.7)
MCV: 86 fL (ref 79–97)
Platelets: 250 10*3/uL (ref 150–450)
RBC: 4.82 x10E6/uL (ref 4.14–5.80)
RDW: 13.9 % (ref 11.6–15.4)
WBC: 6.1 10*3/uL (ref 3.4–10.8)

## 2020-01-29 LAB — BASIC METABOLIC PANEL
BUN/Creatinine Ratio: 18 (ref 10–24)
BUN: 16 mg/dL (ref 8–27)
CO2: 23 mmol/L (ref 20–29)
Calcium: 9.3 mg/dL (ref 8.6–10.2)
Chloride: 106 mmol/L (ref 96–106)
Creatinine, Ser: 0.91 mg/dL (ref 0.76–1.27)
GFR calc Af Amer: 98 mL/min/{1.73_m2} (ref >59)
GFR calc non Af Amer: 84 mL/min/{1.73_m2} (ref >59)
Glucose: 101 mg/dL — ABNORMAL HIGH (ref 65–99)
Potassium: 4.3 mmol/L (ref 3.5–5.2)
Sodium: 143 mmol/L (ref 134–144)

## 2020-01-29 LAB — TSH: TSH: 1.03 u[IU]/mL (ref 0.450–4.500)

## 2020-01-29 MED ORDER — APIXABAN 5 MG PO TABS: 5.0000 mg | ORAL_TABLET | Freq: Two times a day (BID) | ORAL | 6 refills | Status: DC

## 2020-01-29 NOTE — Patient Instructions (Signed)
Medication Instructions:   START ELIQUIS 5 MG ONE TABLET TWICE DAILY  *If you need a refill on your cardiac medications before your next appointment, please call your pharmacy*   Lab Work:  Your physician recommends that you HAVE LAB WORK TODAY  If you have labs (blood work) drawn today and your tests are completely normal, you will receive your results only by: Marland Kitchen MyChart Message (if you have MyChart) OR . A paper copy in the mail If you have any lab test that is abnormal or we need to change your treatment, we will call you to review the results.   Testing/Procedures:  Your physician has requested that you have an abdominal aorta duplex. During this test, an ultrasound is used to evaluate the aorta. Allow 30 minutes for this exam. Do not eat after midnight the day before and avoid carbonated beverages [facility]   Follow-Up: At [facility], you and your health needs are our priority.  As part of our continuing mission to provide you with exceptional heart care, we have created designated Provider Care Teams.  These Care Teams include your primary Cardiologist (physician) and Advanced Practice Providers (APPs -  Physician Assistants and Nurse Practitioners) who all work together to provide you with the care you need, when you need it.  We recommend signing up for the patient portal called "MyChart".  Sign up information is provided on this After Visit Summary.  MyChart is used to connect with patients for Virtual Visits (Telemedicine).  Patients are able to view lab/test results, encounter notes, upcoming appointments, etc.  Non-urgent messages can be sent to your provider as well.   To learn more about what you can do with MyChart, go to [email or web address].    Your next appointment:    Your physician recommends that you schedule a follow-up appointment WITH DR Stanford Breed AFTER SURGERY IS COMPLETE  OKAY TO HOLD ELIQUIS 2 DAYS PRIOR TO SURGERY AND RESTART WHEN OKAY WITH  SURGEON

## 2020-01-29 NOTE — Progress Notes (Signed)
Subjective: 74 y.o. returns the office today for painful, elongated, thickened toenails which he cannot trim himself as well as for calluses to both of the second toes distally. Denies any redness or drainage around the nails.  No other concerns. Denies any systemic complaints such as fevers, chills, nausea, vomiting.   PCP: Merrilee Seashore, MD  Objective: AAO 3, NAD DP/PT pulses palpable, CRT less than 3 seconds Nails hypertrophic, dystrophic, elongated, brittle, discolored 10. There is tenderness overlying the nails 1-5 bilaterally. There is no surrounding erythema or drainage along the nail sites. No significant hyperkeratotic tissue today.  No open lesions or pre-ulcerative lesions are identified. No other areas of tenderness bilateral lower extremities. No overlying edema, erythema, increased warmth. No pain with calf compression, swelling, warmth, erythema.  Assessment: Patient presents with symptomatic onychomycosis  Plan: -Treatment options including alternatives, risks, complications were discussed -Nails sharply debrided 10 without complication/bleeding. -Discussed daily foot inspection. If there are any changes, to call the office immediately.  -Follow-up in 3 months or sooner if any problems are to arise. In the meantime, encouraged to call the office with any questions, concerns, changes symptoms.  Celesta Gentile, DPM

## 2020-01-30 ENCOUNTER — Other Ambulatory Visit: Payer: Self-pay | Admitting: General Surgery

## 2020-02-05 ENCOUNTER — Other Ambulatory Visit: Payer: Self-pay

## 2020-02-05 ENCOUNTER — Ambulatory Visit (HOSPITAL_COMMUNITY)
Admission: RE | Admit: 2020-02-05 | Discharge: 2020-02-05 | Disposition: A | Discharge status: Home / Self Care | Payer: BC Managed Care – PPO | Source: Ambulatory Visit | Attending: Cardiology | Admitting: Cardiology

## 2020-02-05 DIAGNOSIS — R0989 Other specified symptoms and signs involving the circulatory and respiratory systems: Secondary | ICD-10-CM | POA: Diagnosis not present

## 2020-02-08 DIAGNOSIS — M2550 Pain in unspecified joint: Secondary | ICD-10-CM | POA: Diagnosis not present

## 2020-02-08 DIAGNOSIS — M150 Primary generalized (osteo)arthritis: Secondary | ICD-10-CM | POA: Diagnosis not present

## 2020-02-08 DIAGNOSIS — R161 Splenomegaly, not elsewhere classified: Secondary | ICD-10-CM | POA: Diagnosis not present

## 2020-02-08 DIAGNOSIS — L409 Psoriasis, unspecified: Secondary | ICD-10-CM | POA: Diagnosis not present

## 2020-02-08 DIAGNOSIS — N281 Cyst of kidney, acquired: Secondary | ICD-10-CM | POA: Diagnosis not present

## 2020-02-08 DIAGNOSIS — L4059 Other psoriatic arthropathy: Secondary | ICD-10-CM | POA: Diagnosis not present

## 2020-02-08 DIAGNOSIS — Z111 Encounter for screening for respiratory tuberculosis: Secondary | ICD-10-CM | POA: Diagnosis not present

## 2020-02-22 ENCOUNTER — Ambulatory Visit: Payer: BC Managed Care – PPO | Admitting: Cardiology

## 2020-03-04 ENCOUNTER — Other Ambulatory Visit (HOSPITAL_COMMUNITY): Payer: BC Managed Care – PPO

## 2020-03-22 ENCOUNTER — Encounter (HOSPITAL_BASED_OUTPATIENT_CLINIC_OR_DEPARTMENT_OTHER): Payer: Self-pay | Admitting: General Surgery

## 2020-03-22 ENCOUNTER — Other Ambulatory Visit: Payer: Self-pay

## 2020-03-22 NOTE — Progress Notes (Signed)
Completion of pre op phone call with patient. He states that he has not yet started on Eliquis  (for afib) as he needed to wait to hear from his rheumatologist to switch him from sulindac to another drug due to interactions with eliquis and per cardiology recommendations.  He felt like it was ok to wait until after surgery so that he didn't have to "stop and start all these medications."  He states that is NOT taking metoprolol as well. Drug doesn't sound familiar to him.  Called  [place] [facility] office  and left a message for her to make sure that cardiology is aware of these meds not being taken even tho he was cleared on 01/2020 and told to hold eliquis for 2 days prior to surgery.

## 2020-03-22 NOTE — Telephone Encounter (Signed)
   Primary Cardiologist: Kirk Ruths, MD  Chart reviewed as part of pre-operative protocol coverage. Patient was contacted 03/22/2020 in reference to pre-operative risk assessment for pending surgery as outlined below.  Richard [name] was last seen on 01/29/20 by Dr. Stanford Breed.  Since that day, Richard [name] has done well.  Seen 01/29/20 by Dr. Stanford Breed for cardiac clearance prior to hernia repair. Evaluated for persistent atrial fibrillation with EKG showing atrial fib 107bpm. He was recommended to start Eliquis 5mg  BID and discuss changing his NSAID (Sulindac) with his rheumatologist.   Received note from surgery team that there was some concern about him undergoing procedure as when contacted with pre-procedure questions he was not taking Eliquis nor Metoprolol.   Called patient to discuss. It was his understanding he was not to start Eliquis until after changing his NSAID with rheumatology. Tells me rheumatology plans to change his NSAID after procedure. We discussed atrial fibrillation and risk of stroke. Discussed the importance of anticoagulation and that he is recommended to start promptly and hold for 2 days prior to hernia repair. He has picked up Eliquis prescription, but not yet started. He is understandably hesitant to start as he would only take for 4 days prior to holding for surgery. He was agreeable to start after procedure at the direction of his surgeon.   Tells me he has note been taking his Metoprolol. Tells me he never started when prescribed by Dr. Virgina Jock in March. He checks his BP at home using Omron cuff. It gives him a HR range 55bpm - 65pm. Tells me that 'every once in awhile' he gets notification for an irregular heart beat. Anticipate he may have converted to NSR or is in PAF. Due to history of bradycardia on previous monitors and as his HR is well controlled at home, would defer initiation of Metoprolol or other AV nodal blocking agent until follow up with Dr. Stanford Breed.     His hernia repair is scheduled for 03/28/20. He will start Eliquis after the procedure at direction of his surgeon. He is supposed to follow with Dr. Stanford Breed after procedure, will route message to Dr. Jacalyn Lefevre team to facilitate scheduling.   Therefore, based on ACC/AHA guidelines, the patient would be at acceptable risk for the planned procedure without further cardiovascular testing.   The patient was advised that if he develops new symptoms prior to surgery to contact our office to arrange for a follow-up visit, and he verbalized understanding.  I will route this recommendation to Illene Regulus (Dr. Cristal Generous CMA) via Epic and remove from preop pool. Please call with questions.  Richard Dubonnet, NP 03/22/2020, 3:25 PM

## 2020-03-22 NOTE — Telephone Encounter (Signed)
Richard [name] is calling stating when the hospital called the patient in regards to his surgery he advised them he never started his Eliquis or Metoprolol due to knowing he would be stopping them for the surgery. She states now the hospital is questioning putting him under general anesthesia due to him not starting the medications when advised. Please advise.

## 2020-03-25 ENCOUNTER — Other Ambulatory Visit (HOSPITAL_COMMUNITY): Payer: BC Managed Care – PPO

## 2020-03-25 ENCOUNTER — Telehealth: Payer: Self-pay | Admitting: Cardiology

## 2020-03-25 DIAGNOSIS — I4891 Unspecified atrial fibrillation: Secondary | ICD-10-CM

## 2020-03-25 MED ORDER — APIXABAN 5 MG PO TABS: 5.0000 mg | ORAL_TABLET | Freq: Two times a day (BID) | ORAL | 6 refills | Status: DC

## 2020-03-25 NOTE — Telephone Encounter (Signed)
Spoke with pt, Aware of dr crenshaw's recommendations.  Follow up scheduled  

## 2020-03-25 NOTE — Telephone Encounter (Signed)
Would begin apixaban as soon as possible; fu with me as scheduled Richard [name]

## 2020-03-25 NOTE — Telephone Encounter (Signed)
Called patient back- he is set to have Hernia repair surgery on 09/23- but just recently him and his wife decided they were going to cancel it and wait. He has not been able to start on the Eliquis medication yet because of another medication he was on from his other doctor- which they are switching so he can start the Eliquis. He would like to be on the Eliquis for a while before doing the surgery to reduce any risk- his brother just recently had surgery and had complications due to a stroke, as he also has AFIB and basically the same issues. Patient would like Dr.Crenshaw's advise on this to make sure he is doing the right things.  I advised I would route a message to him and his nurse to make them aware and give any recommendations.  Patient verbalized understanding.

## 2020-03-25 NOTE — Telephone Encounter (Signed)
Patient states he needs to speak with Hilda Blades, because he needs to give her some information. He states he will be cancelling his surgery.

## 2020-03-26 DIAGNOSIS — F1120 Opioid dependence, uncomplicated: Secondary | ICD-10-CM | POA: Diagnosis not present

## 2020-03-26 DIAGNOSIS — Z79899 Other long term (current) drug therapy: Secondary | ICD-10-CM | POA: Diagnosis not present

## 2020-03-26 DIAGNOSIS — M47817 Spondylosis without myelopathy or radiculopathy, lumbosacral region: Secondary | ICD-10-CM | POA: Diagnosis not present

## 2020-03-27 ENCOUNTER — Telehealth: Payer: Self-pay | Admitting: Cardiology

## 2020-03-27 DIAGNOSIS — L4050 Arthropathic psoriasis, unspecified: Secondary | ICD-10-CM | POA: Diagnosis not present

## 2020-03-27 DIAGNOSIS — L409 Psoriasis, unspecified: Secondary | ICD-10-CM | POA: Diagnosis not present

## 2020-03-27 DIAGNOSIS — M2550 Pain in unspecified joint: Secondary | ICD-10-CM | POA: Diagnosis not present

## 2020-03-27 DIAGNOSIS — M150 Primary generalized (osteo)arthritis: Secondary | ICD-10-CM | POA: Diagnosis not present

## 2020-03-27 NOTE — Telephone Encounter (Signed)
Spoke with pt, Aware of dr crenshaw's recommendations.  °

## 2020-03-27 NOTE — Telephone Encounter (Signed)
Spoke with the patient who states that he is going to start taking Eliquis tonight. Patient states that he has talked to his rheumatologist and he is no longer taking an NSAID and that if he has any issues then he can have steroid injections. Patient also states that he does sometimes need injections in his knees for his osteoarthritis and is wanting to know if that is okay to do while he is on the Eliquis. Patient does not know what kind of injections they are. I advised him to check with the provider that does the injections. He would like Dr. Jacalyn Lefevre opinion.  Patient also inquiring about the flu shot, which I advised him was okay to receive.

## 2020-03-27 NOTE — Telephone Encounter (Signed)
Pt c/o medication issue:  1. Name of Medication: apixaban (ELIQUIS) 5 MG TABS tablet  2. How are you currently taking this medication (dosage and times per day)? Pt has not started the medicine yet   3. Are you having a reaction (difficulty breathing--STAT)? no  4. What is your medication issue? Pt had some questions he wanted addressed about the Eliquis before he starts taking it

## 2020-03-27 NOTE — Telephone Encounter (Signed)
Agree with initiating apixaban; would need to discuss with person doing injections; if needs to be off would DC 2 days prior to procedure and resume after Kirk Ruths

## 2020-03-28 ENCOUNTER — Ambulatory Visit (HOSPITAL_BASED_OUTPATIENT_CLINIC_OR_DEPARTMENT_OTHER): Admission: RE | Admit: 2020-03-28 | Payer: BC Managed Care – PPO | Source: Home / Self Care | Admitting: General Surgery

## 2020-03-28 HISTORY — DX: Dyspnea, unspecified: R06.00

## 2020-03-28 SURGERY — REPAIR, HERNIA, INGUINAL, ADULT
Anesthesia: General | Laterality: Left

## 2020-03-29 DIAGNOSIS — Z23 Encounter for immunization: Secondary | ICD-10-CM | POA: Diagnosis not present

## 2020-03-29 DIAGNOSIS — M150 Primary generalized (osteo)arthritis: Secondary | ICD-10-CM | POA: Diagnosis not present

## 2020-04-24 NOTE — Progress Notes (Signed)
HPI: FU atrial fibrillation. Patient had Covid March 2020 and has had difficulties with lingering cough and dyspnea. Echocardiogram January 2021 showed normal LV function, mild bileaflet mitral valve prolapse with mild to moderate mitral regurgitation, mild tricuspid regurgitation.  Holter monitor February 2021 showed sinus rhythm with PACs, couplets, bigeminy, atrial run and bradycardia during early a.m. hours. Since having Covid approximately 1-1/2 years ago he has had some fatigue with activities as well as dyspnea with more vigorous activities relieved with rest.  Had initial office visit July 2021 patient was found to have newly diagnosed atrial fibrillation.  We added apixaban.  Abdominal ultrasound August 2021 showed no aneurysm.  Since last seen his dyspnea on exertion has improved.  No orthopnea, PND, pedal edema, chest pain, palpitations, syncope or bleeding.  Current Outpatient Medications  Medication Sig Dispense Refill  . apixaban (ELIQUIS) 5 MG TABS tablet Take 1 tablet (5 mg total) by mouth 2 (two) times daily. 60 tablet 6  . CALCIUM-MAGNESIUM-ZINC PO Take 1 tablet by mouth 2 (two) times daily.     . cholecalciferol (VITAMIN D) 1000 UNITS tablet Take 1,000 Units by mouth daily.     . cyclobenzaprine (FLEXERIL) 10 MG tablet Take 10 mg by mouth 3 (three) times daily as needed for muscle spasms (BACK).     . hydroxychloroquine (PLAQUENIL) 200 MG tablet Take 200 mg by mouth 2 (two) times daily.    Marland Kitchen lisinopril-hydrochlorothiazide (PRINZIDE,ZESTORETIC) 10-12.5 MG per tablet Take 1 tablet by mouth every morning.    . Lutein-Zeaxanthin 25-5 MG CAPS Take by mouth.    . misoprostol (CYTOTEC) 100 MCG tablet Take 100 mcg by mouth 2 (two) times daily. Pt taking as needed.    . Multiple Vitamin (MULTIVITAMIN WITH MINERALS) TABS tablet Take 1 tablet by mouth daily.    Marland Kitchen omeprazole (PRILOSEC) 40 MG capsule TK ONE C PO  ONCE A DAY    . pravastatin (PRAVACHOL) 20 MG tablet Take 20 mg by mouth  daily.    Marland Kitchen triamcinolone cream (KENALOG) 0.1 % Apply 1 application topically daily. Apply to psoriasis rash (Patient taking differently: Apply 1 application topically daily. Apply to psoriasis rash. Pt takes as needed.) 30 g 1  . vitamin B-12 (CYANOCOBALAMIN) 1000 MCG tablet Take 1,000 mcg by mouth daily.    . metoprolol tartrate (LOPRESSOR) 25 MG tablet TAKE 1 TABLET(25 MG) BY MOUTH TWICE DAILY (Patient not taking: Reported on 04/30/2020) 180 tablet 1  . Omega-3 Fatty Acids (FISH OIL) 1000 MG CAPS Take 1,000 mg by mouth daily.  (Patient not taking: Reported on 04/30/2020)     No current facility-administered medications for this visit.     Past Medical History:  Diagnosis Date  . 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 Back pain   . Dyspnea    history of covid 09/2018 and with extertion   . GERD (gastroesophageal reflux disease)   . Headache   . Hypertension   . Pain    PAIN AND OA RT KNEE  . Pneumonia    YEARS AGO  . Psoriasis   . Snoring 04/27/2014    Past Surgical History:  Procedure Laterality Date  . KNEE ARTHROSCOPY Right 01/31/2014   Procedure: RIGHT ARTHROSCOPY KNEE WITH DEBRIDMENT CHONDROPLASTY;  Surgeon: [name], MD;  Location: [facility];  Service: Orthopedics;  Laterality: Right;  . LUMP REMOVED FROM LEFT BREAST Left 2008   STATES SURGERY  AT [facility] -  LOCAL- NOT CANCER 5 OR MORE YRS AGO  . MORTON'S NEUROMA REMOVED    . TONSILLECTOMY     AS A CHILD    Social History   Socioeconomic History  . Marital status: Married    Spouse name: Not on file  . Number of children: 0  . Years of education: Not on file  . Highest education level: Not on file  Occupational History  . Not on file  Tobacco Use  . Smoking status: Former Smoker    Packs/day: 0.25    Types: Cigarettes    Quit date: 1990    Years since quitting: 31.8  . Smokeless tobacco: Never Used  Vaping Use  . Vaping Use: Never used  Substance and Sexual Activity  . Alcohol use:  Yes    Comment: QUIT SMOKING ABOUT 15 YRS AGO--WAS ONLY 2 OR 3 CIGARETTES WITH A BEER ON WEEK ENDS  . Drug use: No  . Sexual activity: Not on file  Other Topics Concern  . Not on file  Social History Narrative   Right handed, caffeine 2-3 cups daily, married, no kids, has baby bird.  WC back injury, so not working at this time.  BS in econmics.   Social Determinants of Health   Financial Resource Strain:   . Difficulty of Paying Living Expenses: Not on file  Food Insecurity:   . Worried About Charity fundraiser in the Last Year: Not on file  . Ran Out of Food in the Last Year: Not on file  Transportation Needs:   . Lack of Transportation (Medical): Not on file  . Lack of Transportation (Non-Medical): Not on file  Physical Activity:   . Days of Exercise per Week: Not on file  . Minutes of Exercise per Session: Not on file  Stress:   . Feeling of Stress : Not on file  Social Connections:   . Frequency of Communication with Friends and Family: Not on file  . Frequency of Social Gatherings with Friends and Family: Not on file  . Attends Religious Services: Not on file  . Active Member of Clubs or Organizations: Not on file  . Attends Archivist Meetings: Not on file  . Marital Status: Not on file  Intimate Partner Violence:   . Fear of Current or Ex-Partner: Not on file  . Emotionally Abused: Not on file  . Physically Abused: Not on file  . Sexually Abused: Not on file    Family History  Problem Relation Age of Onset  . Diabetes Mother   . Hypertension Mother   . Heart disease Mother        has a stent   . Stroke Father 55  . Hypertension Father     ROS: no fevers or chills, productive cough, hemoptysis, dysphasia, odynophagia, melena, hematochezia, dysuria, hematuria, rash, seizure activity, orthopnea, PND, pedal edema, claudication. Remaining systems are negative.  Physical Exam: Well-developed well-nourished in no acute distress.  Skin is warm and dry.    HEENT is normal.  Neck is supple.  Chest is clear to auscultation with normal expansion.  Cardiovascular exam is regular rate and rhythm.  Abdominal exam nontender or distended. No masses palpated. Extremities show no edema. neuro grossly intact  ECG-sinus bradycardia with occasional PAC, RV conduction delay, nonspecific ST changes, first-degree AV block.  Personally reviewed  A/P  1 Paroxysmal atrial fibrillation-patient has converted to sinus rhythm.  Symptomatically he has improved and therefore rhythm control might be best option in  the future if atrial fibrillation recurs.  Continue Lopressor and apixaban.  Note LV function and TSH normal.  2 history of mild to moderate mitral regurgitation-plan follow-up echocardiogram January 2022.  3 hypertension-patient's blood pressure is controlled.  Continue present medications and follow.  4 hyperlipidemia-continue statin.  Kirk Ruths, MD

## 2020-04-30 ENCOUNTER — Encounter: Payer: Self-pay | Admitting: Cardiology

## 2020-04-30 ENCOUNTER — Other Ambulatory Visit: Payer: Self-pay

## 2020-04-30 ENCOUNTER — Ambulatory Visit (INDEPENDENT_AMBULATORY_CARE_PROVIDER_SITE_OTHER): Payer: BC Managed Care – PPO | Admitting: Cardiology

## 2020-04-30 VITALS — BP 114/60 | HR 52 | Ht 69.0 in | Wt 162.8 lb

## 2020-04-30 DIAGNOSIS — I10 Essential (primary) hypertension: Secondary | ICD-10-CM | POA: Diagnosis not present

## 2020-04-30 DIAGNOSIS — I4891 Unspecified atrial fibrillation: Secondary | ICD-10-CM

## 2020-04-30 DIAGNOSIS — E7800 Pure hypercholesterolemia, unspecified: Secondary | ICD-10-CM | POA: Diagnosis not present

## 2020-04-30 DIAGNOSIS — I480 Paroxysmal atrial fibrillation: Secondary | ICD-10-CM | POA: Diagnosis not present

## 2020-04-30 NOTE — Patient Instructions (Signed)

## 2020-05-02 ENCOUNTER — Ambulatory Visit: Payer: BC Managed Care – PPO | Admitting: Podiatry

## 2020-05-02 ENCOUNTER — Other Ambulatory Visit: Payer: Self-pay

## 2020-05-02 DIAGNOSIS — M79674 Pain in right toe(s): Secondary | ICD-10-CM

## 2020-05-02 DIAGNOSIS — B351 Tinea unguium: Secondary | ICD-10-CM

## 2020-05-02 DIAGNOSIS — M79675 Pain in left toe(s): Secondary | ICD-10-CM

## 2020-05-02 DIAGNOSIS — Z7901 Long term (current) use of anticoagulants: Secondary | ICD-10-CM | POA: Diagnosis not present

## 2020-05-03 NOTE — Progress Notes (Signed)
Subjective: 74 y.o. returns the office today for painful, elongated, thickened toenails which he cannot trim himself as well as for calluses to both of the second toes distally. Denies any redness or drainage around the nails.  No other concerns. Denies any systemic complaints such as fevers, chills, nausea, vomiting.   PCP: Merrilee Seashore, MD  He is on Eliquis for A-fib  Objective: AAO 3, NAD DP/PT pulses palpable, CRT less than 3 seconds Nails hypertrophic, dystrophic, elongated, brittle, discolored 10. There is tenderness overlying the nails 1-5 bilaterally. There is no surrounding erythema or drainage along the nail sites.  No open lesions or pre-ulcerative lesions are identified. No other areas of tenderness bilateral lower extremities. No overlying edema, erythema, increased warmth. No pain with calf compression, swelling, warmth, erythema.  Assessment: Patient presents with symptomatic onychomycosis, on Eliquis   Plan: -Treatment options including alternatives, risks, complications were discussed -Nails sharply debrided 10 without complication/bleeding. -Discussed daily foot inspection. If there are any changes, to call the office immediately.  -Follow-up in 3 months or sooner if any problems are to arise. In the meantime, encouraged to call the office with any questions, concerns, changes symptoms.  Celesta Gentile, DPM

## 2020-06-13 DIAGNOSIS — E782 Mixed hyperlipidemia: Secondary | ICD-10-CM | POA: Diagnosis not present

## 2020-06-13 DIAGNOSIS — Z0000 Encounter for general adult medical examination without abnormal findings: Secondary | ICD-10-CM | POA: Diagnosis not present

## 2020-06-13 DIAGNOSIS — Z125 Encounter for screening for malignant neoplasm of prostate: Secondary | ICD-10-CM | POA: Diagnosis not present

## 2020-07-03 ENCOUNTER — Encounter: Payer: Self-pay | Admitting: *Deleted

## 2020-07-03 DIAGNOSIS — I059 Rheumatic mitral valve disease, unspecified: Secondary | ICD-10-CM

## 2020-07-06 HISTORY — PX: TRANSTHORACIC ECHOCARDIOGRAM: SHX275

## 2020-07-10 ENCOUNTER — Telehealth: Payer: Self-pay | Admitting: Cardiology

## 2020-07-10 NOTE — Telephone Encounter (Signed)
Patient called to talk to Dr. Jens Som or nurse. Please call back

## 2020-07-10 NOTE — Telephone Encounter (Signed)
Pt missed call to have echo scheduled and would like to do that soon. Told pt that this RN would sent a message to our schedulers to give him a call. Pt verbalizes understanding.

## 2020-07-25 ENCOUNTER — Ambulatory Visit
Admission: RE | Admit: 2020-07-25 | Discharge: 2020-07-25 | Disposition: A | Discharge status: Home / Self Care | Payer: BC Managed Care – PPO | Source: Ambulatory Visit | Attending: Internal Medicine | Admitting: Internal Medicine

## 2020-07-25 ENCOUNTER — Other Ambulatory Visit: Payer: Self-pay

## 2020-07-25 ENCOUNTER — Other Ambulatory Visit: Payer: Self-pay | Admitting: Internal Medicine

## 2020-07-25 DIAGNOSIS — R110 Nausea: Secondary | ICD-10-CM

## 2020-07-25 DIAGNOSIS — R519 Headache, unspecified: Secondary | ICD-10-CM | POA: Diagnosis not present

## 2020-07-25 DIAGNOSIS — W19XXXA Unspecified fall, initial encounter: Secondary | ICD-10-CM

## 2020-07-25 DIAGNOSIS — G44319 Acute post-traumatic headache, not intractable: Secondary | ICD-10-CM | POA: Diagnosis not present

## 2020-07-29 ENCOUNTER — Ambulatory Visit: Payer: BC Managed Care – PPO | Admitting: Podiatry

## 2020-07-29 ENCOUNTER — Other Ambulatory Visit: Payer: Self-pay

## 2020-07-29 DIAGNOSIS — M79675 Pain in left toe(s): Secondary | ICD-10-CM | POA: Diagnosis not present

## 2020-07-29 DIAGNOSIS — Z7901 Long term (current) use of anticoagulants: Secondary | ICD-10-CM

## 2020-07-29 DIAGNOSIS — B351 Tinea unguium: Secondary | ICD-10-CM | POA: Diagnosis not present

## 2020-07-29 DIAGNOSIS — M79674 Pain in right toe(s): Secondary | ICD-10-CM | POA: Diagnosis not present

## 2020-07-31 NOTE — Progress Notes (Signed)
Subjective: 75 y.o. returns the office today for painful, elongated, thickened toenails which he cannot trim himself. Denies any redness or drainage around the nails.  No other concerns. Denies any systemic complaints such as fevers, chills, nausea, vomiting.   PCP: Merrilee Seashore, MD  He is on Eliquis for A-fib  Objective: AAO 3, NAD DP/PT pulses palpable, CRT less than 3 seconds Nails hypertrophic, dystrophic, elongated, brittle, discolored 10. There is tenderness overlying the nails 1-5 bilaterally. There is no surrounding erythema or drainage along the nail sites.  No open lesions or pre-ulcerative lesions are identified. No pain with calf compression, swelling, warmth, erythema.  Assessment: Patient presents with symptomatic onychomycosis, on Eliquis   Plan: -Treatment options including alternatives, risks, complications were discussed -Nails sharply debrided 10 without complication/bleeding. -Discussed daily foot inspection. If there are any changes, to call the office immediately.  -Follow-up in 3 months or sooner if any problems are to arise. In the meantime, encouraged to call the office with any questions, concerns, changes symptoms.  Celesta Gentile, DPM

## 2020-08-02 ENCOUNTER — Other Ambulatory Visit: Payer: Self-pay

## 2020-08-02 ENCOUNTER — Ambulatory Visit (HOSPITAL_COMMUNITY): Payer: BC Managed Care – PPO | Attending: Cardiology

## 2020-08-02 DIAGNOSIS — I059 Rheumatic mitral valve disease, unspecified: Secondary | ICD-10-CM | POA: Diagnosis not present

## 2020-08-02 LAB — ECHOCARDIOGRAM COMPLETE
Area-P 1/2: 1.02 cm2
S' Lateral: 2.7 cm

## 2020-08-06 DIAGNOSIS — L4050 Arthropathic psoriasis, unspecified: Secondary | ICD-10-CM | POA: Diagnosis not present

## 2020-08-06 DIAGNOSIS — L409 Psoriasis, unspecified: Secondary | ICD-10-CM | POA: Diagnosis not present

## 2020-08-06 DIAGNOSIS — M2550 Pain in unspecified joint: Secondary | ICD-10-CM | POA: Diagnosis not present

## 2020-08-06 DIAGNOSIS — M150 Primary generalized (osteo)arthritis: Secondary | ICD-10-CM | POA: Diagnosis not present

## 2020-08-14 DIAGNOSIS — J342 Deviated nasal septum: Secondary | ICD-10-CM | POA: Insufficient documentation

## 2020-08-14 DIAGNOSIS — H9193 Unspecified hearing loss, bilateral: Secondary | ICD-10-CM | POA: Diagnosis not present

## 2020-08-14 DIAGNOSIS — R053 Chronic cough: Secondary | ICD-10-CM | POA: Insufficient documentation

## 2020-08-16 DIAGNOSIS — L4050 Arthropathic psoriasis, unspecified: Secondary | ICD-10-CM | POA: Diagnosis not present

## 2020-08-16 DIAGNOSIS — I480 Paroxysmal atrial fibrillation: Secondary | ICD-10-CM | POA: Diagnosis not present

## 2020-08-16 DIAGNOSIS — Z0000 Encounter for general adult medical examination without abnormal findings: Secondary | ICD-10-CM | POA: Diagnosis not present

## 2020-08-16 DIAGNOSIS — E782 Mixed hyperlipidemia: Secondary | ICD-10-CM | POA: Diagnosis not present

## 2020-08-16 DIAGNOSIS — Z23 Encounter for immunization: Secondary | ICD-10-CM | POA: Diagnosis not present

## 2020-08-19 DIAGNOSIS — F1120 Opioid dependence, uncomplicated: Secondary | ICD-10-CM | POA: Diagnosis not present

## 2020-08-19 DIAGNOSIS — Z79899 Other long term (current) drug therapy: Secondary | ICD-10-CM | POA: Diagnosis not present

## 2020-08-19 DIAGNOSIS — M47817 Spondylosis without myelopathy or radiculopathy, lumbosacral region: Secondary | ICD-10-CM | POA: Diagnosis not present

## 2020-09-11 DIAGNOSIS — H903 Sensorineural hearing loss, bilateral: Secondary | ICD-10-CM | POA: Diagnosis not present

## 2020-10-10 ENCOUNTER — Other Ambulatory Visit: Payer: Self-pay | Admitting: General Surgery

## 2020-10-10 DIAGNOSIS — K4090 Unilateral inguinal hernia, without obstruction or gangrene, not specified as recurrent: Secondary | ICD-10-CM | POA: Diagnosis not present

## 2020-10-11 ENCOUNTER — Other Ambulatory Visit: Payer: Self-pay | Admitting: Cardiology

## 2020-10-11 DIAGNOSIS — I4891 Unspecified atrial fibrillation: Secondary | ICD-10-CM

## 2020-10-11 NOTE — Telephone Encounter (Signed)
Prescription refill request for Eliquis received. Indication: atrial fib Last office visit:10/21 crenshaw Scr:0.91  7/21 Age: 75 Weight:73.8 kg  Prescription refilled

## 2020-10-15 ENCOUNTER — Telehealth: Payer: Self-pay | Admitting: Pharmacist

## 2020-10-15 DIAGNOSIS — I4891 Unspecified atrial fibrillation: Secondary | ICD-10-CM

## 2020-10-15 MED ORDER — ELIQUIS 5 MG PO TABS: ORAL_TABLET | ORAL | 1 refills | Status: DC

## 2020-10-15 NOTE — Telephone Encounter (Signed)
Received fax that pt's pharmacy started PA request for Eliquis but it looks like a tier exception form which should not be needed. Called pharmacy to clarify, they stated the rx was going through for $25/1 month without any issues. Unsure why we received PA form, however upon further investigation pt actually has Pharmacist, community and qualifies for $30/3 month copay card. I have activated a card (ID [id number]) and called it into pt's pharmacy, he is aware and was appreciative for the cost savings.

## 2020-10-16 ENCOUNTER — Telehealth: Payer: Self-pay | Admitting: *Deleted

## 2020-10-16 NOTE — Telephone Encounter (Signed)
   Richard [name] Pre-operative Risk Assessment    Patient Name: Richard [name]  DOB: [birth year]  MRN: [id number]     Request for surgical clearance:  1. What type of surgery is being performed?  Left inguinal hernia repair w/mesh  2. When is this surgery scheduled? TBD  3. What type of clearance is required (medical clearance vs. Pharmacy clearance to hold med vs. Both)? both  4. Are there any medications that need to be held prior to surgery and how long? Eliquis 16m  5. Practice name and name of physician performing surgery? Richard [name]  6. What is the office phone number? [phone]   7.   What is the office fax number?  [address]  Anesthesia type (None, local, MAC, general) ?  General    Richard Simmonds4/13/2022, 8:49 AM  _________________________________________________________________   (provider comments below)

## 2020-10-17 DIAGNOSIS — L4050 Arthropathic psoriasis, unspecified: Secondary | ICD-10-CM | POA: Diagnosis not present

## 2020-10-17 NOTE — Telephone Encounter (Signed)
Please comment on Eliquis. Thanks! 

## 2020-10-17 NOTE — Telephone Encounter (Signed)
Patient with diagnosis of afib on Eliquis for anticoagulation.    Procedure: Left inguinal hernia repair w/mesh  Date of procedure: TBD  CHA2DS2-VASc Score = 2  This indicates a 2.2% annual risk of stroke. The patient's score is based upon: CHF History: No HTN History: Yes Diabetes History: No Stroke History: No Vascular Disease History: No Age Score: 1 Gender Score: 0   CrCl 30mL/min Platelet count 250K  Per office protocol, patient can hold Eliquis for 2-3 days prior to procedure.

## 2020-10-18 NOTE — Telephone Encounter (Signed)
LVM to call back.

## 2020-10-24 NOTE — Telephone Encounter (Signed)
Left a message for the patient to call back and speak to the on-call preop APP of the day 

## 2020-10-30 NOTE — Telephone Encounter (Addendum)
Patient does not have a history of CAD.  Recent echocardiogram was reassuring.  He can accomplish more than 4 METS of activity.  I feel the patient can proceed with surgery, however he wished to be seen by Dr. Stanford Breed prior to surgery.  Spoke with Dr. Jacalyn Lefevre nurse, we have managed to find him a slot on June 8 at 8:20 AM.  Will defer final clearance to MD.

## 2020-11-12 DIAGNOSIS — Z79899 Other long term (current) drug therapy: Secondary | ICD-10-CM | POA: Diagnosis not present

## 2020-11-12 DIAGNOSIS — M47817 Spondylosis without myelopathy or radiculopathy, lumbosacral region: Secondary | ICD-10-CM | POA: Diagnosis not present

## 2020-11-16 DIAGNOSIS — Z20822 Contact with and (suspected) exposure to covid-19: Secondary | ICD-10-CM | POA: Diagnosis not present

## 2020-11-18 DIAGNOSIS — M791 Myalgia, unspecified site: Secondary | ICD-10-CM | POA: Diagnosis not present

## 2020-11-18 DIAGNOSIS — R293 Abnormal posture: Secondary | ICD-10-CM | POA: Diagnosis not present

## 2020-11-18 DIAGNOSIS — R269 Unspecified abnormalities of gait and mobility: Secondary | ICD-10-CM | POA: Diagnosis not present

## 2020-11-18 DIAGNOSIS — M545 Low back pain, unspecified: Secondary | ICD-10-CM | POA: Diagnosis not present

## 2020-11-27 NOTE — Progress Notes (Signed)
HPI: FU atrial fibrillation.  Holter monitor February 2021 showed sinus rhythm with PACs, couplets, bigeminy, atrial run and bradycardia during early a.m. hours. Since having Covid March 2020 he has had some fatigue with activities as well as dyspnea with more vigorous activities relieved with rest.  At initial office visit July 2021 patient was found to have newly diagnosed atrial fibrillation. We added apixaban. Abdominal ultrasound August 2021 showed no aneurysm. Echocardiogram January 2022 showed normal LV function, grade 1 diastolic dysfunction, mild right ventricular enlargement, moderate left atrial enlargement, moderate right atrial enlargement, mild mitral regurgitation secondary to bileaflet mitral valve prolapse, moderate tricuspid regurgitation.  Since last seen he denies dyspnea, chest pain, palpitations or syncope.  No bleeding.  He has good functional capacity and can ambulate up to 1 mile with no dyspnea or chest pain.  Current Outpatient Medications  Medication Sig Dispense Refill  . apixaban (ELIQUIS) 5 MG TABS tablet TAKE 1 TABLET(5 MG) BY MOUTH TWICE DAILY 180 tablet 1  . CALCIUM-MAGNESIUM-ZINC PO Take 1 tablet by mouth 2 (two) times daily.     . cholecalciferol (VITAMIN D) 1000 UNITS tablet Take 1,000 Units by mouth daily.     . cyclobenzaprine (FLEXERIL) 10 MG tablet Take 10 mg by mouth 3 (three) times daily as needed for muscle spasms (BACK).     Marland Kitchen HYDROcodone-acetaminophen (NORCO/VICODIN) 5-325 MG tablet Take 1 tablet by mouth daily as needed.    . hydroxychloroquine (PLAQUENIL) 200 MG tablet Take 200 mg by mouth 2 (two) times daily.    Marland Kitchen lisinopril-hydrochlorothiazide (PRINZIDE,ZESTORETIC) 10-12.5 MG per tablet Take 1 tablet by mouth every morning.    . misoprostol (CYTOTEC) 100 MCG tablet Take 100 mcg by mouth 2 (two) times daily. Pt taking as needed.    . Multiple Vitamin (MULTIVITAMIN WITH MINERALS) TABS tablet Take 1 tablet by mouth daily.    Marland Kitchen omeprazole  (PRILOSEC) 40 MG capsule TK ONE C PO  ONCE A DAY    . pravastatin (PRAVACHOL) 20 MG tablet Take 20 mg by mouth daily.    Marland Kitchen triamcinolone cream (KENALOG) 0.1 % Apply 1 application topically daily. Apply to psoriasis rash (Patient taking differently: Apply 1 application topically daily. Apply to psoriasis rash. Pt takes as needed.) 30 g 1  . vitamin B-12 (CYANOCOBALAMIN) 1000 MCG tablet Take 1,000 mcg by mouth daily.    . Cod Liver Oil CAPS     . Lutein-Zeaxanthin 25-5 MG CAPS Take by mouth.     No current facility-administered medications for this visit.     Past Medical History:  Diagnosis Date  . 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 Back pain   . Dyspnea    history of covid 09/2018 and with extertion   . GERD (gastroesophageal reflux disease)   . Headache   . Hypertension   . Pain    PAIN AND OA RT KNEE  . Pneumonia    YEARS AGO  . Psoriasis   . Snoring 04/27/2014    Past Surgical History:  Procedure Laterality Date  . KNEE ARTHROSCOPY Right 01/31/2014   Procedure: RIGHT ARTHROSCOPY KNEE WITH DEBRIDMENT CHONDROPLASTY;  Surgeon: [name], MD;  Location: [facility];  Service: Orthopedics;  Laterality: Right;  . LUMP REMOVED FROM LEFT BREAST Left 2008   STATES SURGERY  AT [facility] - LOCAL- NOT CANCER 5 OR MORE YRS AGO  . MORTON'S NEUROMA REMOVED    . TONSILLECTOMY  AS A CHILD    Social History   Socioeconomic History  . Marital status: Married    Spouse name: Not on file  . Number of children: 0  . Years of education: Not on file  . Highest education level: Not on file  Occupational History  . Not on file  Tobacco Use  . Smoking status: Former Smoker    Packs/day: 0.25    Types: Cigarettes    Quit date: 1990    Years since quitting: 32.4  . Smokeless tobacco: Never Used  Vaping Use  . Vaping Use: Never used  Substance and Sexual Activity  . Alcohol use: Yes    Comment: QUIT SMOKING ABOUT 15 YRS AGO--WAS ONLY 2 OR 3 CIGARETTES WITH A  BEER ON WEEK ENDS  . Drug use: No  . Sexual activity: Not on file  Other Topics Concern  . Not on file  Social History Narrative   Right handed, caffeine 2-3 cups daily, married, no kids, has baby bird.  WC back injury, so not working at this time.  BS in econmics.   Social Determinants of Health   Financial Resource Strain: Not on file  Food Insecurity: Not on file  Transportation Needs: Not on file  Physical Activity: Not on file  Stress: Not on file  Social Connections: Not on file  Intimate Partner Violence: Not on file    Family History  Problem Relation Age of Onset  . Diabetes Mother   . Hypertension Mother   . Heart disease Mother        has a stent   . Stroke Father 62  . Hypertension Father     ROS: Some arthralgias but no fevers or chills, productive cough, hemoptysis, dysphasia, odynophagia, melena, hematochezia, dysuria, hematuria, rash, seizure activity, orthopnea, PND, pedal edema, claudication. Remaining systems are negative.  Physical Exam: Well-developed well-nourished in no acute distress.  Skin is warm and dry.  HEENT is normal.  Neck is supple.  Chest is clear to auscultation with normal expansion.  Cardiovascular exam is regular rate and rhythm.  Abdominal exam nontender or distended. No masses palpated. Extremities show no edema. neuro grossly intact  ECG-sinus bradycardia at a rate of 51, first-degree AV block, no ST changes.  Personally reviewed  A/P  1 paroxysmal atrial fibrillation-patient remains in sinus rhythm on examination.  Continue apixaban.  Note LV function is normal.  If he has recurrent episodes in the future that he may require antiarrhythmic as he was symptomatic with his atrial fibrillation previously.    2 mitral valve prolapse-mitral regurgitation mild on most recent echocardiogram.  3 hypertension-blood pressure controlled.  Continue present medical regimen.  4 hyperlipidemia-continue statin.   5 preoperative evaluation  prior to inguinal hernia repair-patient has good functional capacity with no chest pain or dyspnea.  He may proceed without further cardiac evaluation.  Hold apixaban 2 days prior to procedure and resume after when okay with surgery and hemostasis achieved.  Kirk Ruths, MD

## 2020-12-03 DIAGNOSIS — R269 Unspecified abnormalities of gait and mobility: Secondary | ICD-10-CM | POA: Diagnosis not present

## 2020-12-03 DIAGNOSIS — R293 Abnormal posture: Secondary | ICD-10-CM | POA: Diagnosis not present

## 2020-12-03 DIAGNOSIS — M545 Low back pain, unspecified: Secondary | ICD-10-CM | POA: Diagnosis not present

## 2020-12-03 DIAGNOSIS — M791 Myalgia, unspecified site: Secondary | ICD-10-CM | POA: Diagnosis not present

## 2020-12-11 ENCOUNTER — Encounter: Payer: Self-pay | Admitting: Cardiology

## 2020-12-11 ENCOUNTER — Ambulatory Visit: Payer: BC Managed Care – PPO | Admitting: Cardiology

## 2020-12-11 ENCOUNTER — Other Ambulatory Visit: Payer: Self-pay

## 2020-12-11 ENCOUNTER — Ambulatory Visit (INDEPENDENT_AMBULATORY_CARE_PROVIDER_SITE_OTHER): Payer: BC Managed Care – PPO | Admitting: Cardiology

## 2020-12-11 VITALS — BP 128/80 | HR 51 | Ht 69.0 in | Wt 162.0 lb

## 2020-12-11 DIAGNOSIS — I480 Paroxysmal atrial fibrillation: Secondary | ICD-10-CM | POA: Diagnosis not present

## 2020-12-11 DIAGNOSIS — I10 Essential (primary) hypertension: Secondary | ICD-10-CM

## 2020-12-11 DIAGNOSIS — E7800 Pure hypercholesterolemia, unspecified: Secondary | ICD-10-CM | POA: Diagnosis not present

## 2020-12-11 DIAGNOSIS — I059 Rheumatic mitral valve disease, unspecified: Secondary | ICD-10-CM | POA: Diagnosis not present

## 2020-12-11 DIAGNOSIS — Z01810 Encounter for preprocedural cardiovascular examination: Secondary | ICD-10-CM

## 2020-12-11 NOTE — Patient Instructions (Signed)

## 2021-01-21 ENCOUNTER — Ambulatory Visit: Payer: Medicare Other | Admitting: Podiatry

## 2021-01-21 ENCOUNTER — Other Ambulatory Visit: Payer: Self-pay

## 2021-01-21 ENCOUNTER — Encounter: Payer: Self-pay | Admitting: Podiatry

## 2021-01-21 DIAGNOSIS — M79674 Pain in right toe(s): Secondary | ICD-10-CM

## 2021-01-21 DIAGNOSIS — M79675 Pain in left toe(s): Secondary | ICD-10-CM | POA: Diagnosis not present

## 2021-01-21 DIAGNOSIS — Z7901 Long term (current) use of anticoagulants: Secondary | ICD-10-CM

## 2021-01-21 DIAGNOSIS — B351 Tinea unguium: Secondary | ICD-10-CM

## 2021-01-23 ENCOUNTER — Other Ambulatory Visit: Payer: Self-pay

## 2021-01-23 ENCOUNTER — Encounter (HOSPITAL_BASED_OUTPATIENT_CLINIC_OR_DEPARTMENT_OTHER)
Admission: RE | Admit: 2021-01-23 | Discharge: 2021-01-23 | Disposition: A | Discharge status: Home / Self Care | Payer: Medicare Other | Source: Ambulatory Visit | Attending: General Surgery | Admitting: General Surgery

## 2021-01-23 ENCOUNTER — Encounter (HOSPITAL_BASED_OUTPATIENT_CLINIC_OR_DEPARTMENT_OTHER): Payer: Self-pay | Admitting: General Surgery

## 2021-01-23 DIAGNOSIS — Z01812 Encounter for preprocedural laboratory examination: Secondary | ICD-10-CM | POA: Insufficient documentation

## 2021-01-23 LAB — BASIC METABOLIC PANEL
Anion gap: 7 (ref 5–15)
BUN: 14 mg/dL (ref 8–23)
CO2: 26 mmol/L (ref 22–32)
Calcium: 9.4 mg/dL (ref 8.9–10.3)
Chloride: 107 mmol/L (ref 98–111)
Creatinine, Ser: 0.81 mg/dL (ref 0.61–1.24)
GFR, Estimated: >60 mL/min (ref >60)
Glucose, Bld: 115 mg/dL — ABNORMAL HIGH (ref 70–99)
Potassium: 4 mmol/L (ref 3.5–5.1)
Sodium: 140 mmol/L (ref 135–145)

## 2021-01-25 NOTE — Progress Notes (Signed)
Subjective: 75 y.o. returns the office today for painful, elongated, thickened toenails which he cannot trim himself. Denies any redness or drainage around the nails.  No other concerns. Denies any systemic complaints such as fevers, chills, nausea, vomiting.   PCP: Merrilee Seashore, MD  He is on Eliquis for A-fib  Objective: AAO 3, NAD DP/PT pulses palpable, CRT less than 3 seconds Nails hypertrophic, dystrophic, elongated, brittle, discolored 10. There is tenderness overlying the nails 1-5 bilaterally. There is no surrounding erythema or drainage along the nail sites.  No open lesions or pre-ulcerative lesions are identified. No pain with calf compression, swelling, warmth, erythema.  Assessment: Patient presents with symptomatic onychomycosis, on Eliquis   Plan: -Treatment options including alternatives, risks, complications were discussed -Nails sharply debrided 10 without complication/bleeding. -Discussed daily foot inspection. If there are any changes, to call the office immediately.  -Follow-up in 3 months or sooner if any problems are to arise. In the meantime, encouraged to call the office with any questions, concerns, changes symptoms.  Celesta Gentile, DPM

## 2021-01-27 ENCOUNTER — Other Ambulatory Visit: Payer: Self-pay | Admitting: General Surgery

## 2021-01-29 ENCOUNTER — Other Ambulatory Visit: Payer: Self-pay

## 2021-01-29 ENCOUNTER — Ambulatory Visit (HOSPITAL_BASED_OUTPATIENT_CLINIC_OR_DEPARTMENT_OTHER): Payer: Medicare Other | Admitting: Anesthesiology

## 2021-01-29 ENCOUNTER — Encounter (HOSPITAL_BASED_OUTPATIENT_CLINIC_OR_DEPARTMENT_OTHER)
Admission: RE | Disposition: A | Discharge status: Home / Self Care | Payer: Self-pay | Source: Home / Self Care | Attending: General Surgery

## 2021-01-29 ENCOUNTER — Encounter (HOSPITAL_BASED_OUTPATIENT_CLINIC_OR_DEPARTMENT_OTHER): Payer: Self-pay | Admitting: General Surgery

## 2021-01-29 ENCOUNTER — Ambulatory Visit (HOSPITAL_BASED_OUTPATIENT_CLINIC_OR_DEPARTMENT_OTHER)
Admission: RE | Admit: 2021-01-29 | Discharge: 2021-01-29 | Disposition: A | Discharge status: Home / Self Care | Payer: Medicare Other | Attending: General Surgery | Admitting: General Surgery

## 2021-01-29 DIAGNOSIS — I4891 Unspecified atrial fibrillation: Secondary | ICD-10-CM | POA: Diagnosis not present

## 2021-01-29 DIAGNOSIS — Z79899 Other long term (current) drug therapy: Secondary | ICD-10-CM | POA: Diagnosis not present

## 2021-01-29 DIAGNOSIS — K4090 Unilateral inguinal hernia, without obstruction or gangrene, not specified as recurrent: Secondary | ICD-10-CM | POA: Insufficient documentation

## 2021-01-29 DIAGNOSIS — I10 Essential (primary) hypertension: Secondary | ICD-10-CM | POA: Diagnosis not present

## 2021-01-29 DIAGNOSIS — K219 Gastro-esophageal reflux disease without esophagitis: Secondary | ICD-10-CM | POA: Diagnosis not present

## 2021-01-29 DIAGNOSIS — I081 Rheumatic disorders of both mitral and tricuspid valves: Secondary | ICD-10-CM | POA: Insufficient documentation

## 2021-01-29 DIAGNOSIS — G8918 Other acute postprocedural pain: Secondary | ICD-10-CM | POA: Diagnosis not present

## 2021-01-29 DIAGNOSIS — E782 Mixed hyperlipidemia: Secondary | ICD-10-CM | POA: Diagnosis not present

## 2021-01-29 DIAGNOSIS — K4091 Unilateral inguinal hernia, without obstruction or gangrene, recurrent: Secondary | ICD-10-CM | POA: Diagnosis not present

## 2021-01-29 DIAGNOSIS — L4050 Arthropathic psoriasis, unspecified: Secondary | ICD-10-CM | POA: Insufficient documentation

## 2021-01-29 DIAGNOSIS — M069 Rheumatoid arthritis, unspecified: Secondary | ICD-10-CM | POA: Insufficient documentation

## 2021-01-29 DIAGNOSIS — Z87891 Personal history of nicotine dependence: Secondary | ICD-10-CM | POA: Diagnosis not present

## 2021-01-29 HISTORY — PX: INGUINAL HERNIA REPAIR: SHX194

## 2021-01-29 HISTORY — DX: Unspecified atrial fibrillation: I48.91

## 2021-01-29 HISTORY — DX: Cardiac arrhythmia, unspecified: I49.9

## 2021-01-29 SURGERY — REPAIR, HERNIA, INGUINAL, ADULT
Anesthesia: General | Site: Groin | Laterality: Left

## 2021-01-29 MED ORDER — METHYLENE BLUE 0.5 % INJ SOLN
INTRAVENOUS | Status: AC
  Filled 2021-01-29: qty 10

## 2021-01-29 MED ORDER — MIDAZOLAM HCL 2 MG/2ML IJ SOLN
INTRAMUSCULAR | Status: AC
  Filled 2021-01-29: qty 2

## 2021-01-29 MED ORDER — ACETAMINOPHEN 500 MG PO TABS
ORAL_TABLET | ORAL | Status: AC
  Filled 2021-01-29: qty 2

## 2021-01-29 MED ORDER — FENTANYL CITRATE (PF) 100 MCG/2ML IJ SOLN
INTRAMUSCULAR | Status: AC
  Filled 2021-01-29: qty 2

## 2021-01-29 MED ORDER — BUPIVACAINE HCL (PF) 0.25 % IJ SOLN
INTRAMUSCULAR | Status: DC | PRN
  Administered 2021-01-29: 10 mL

## 2021-01-29 MED ORDER — OXYCODONE HCL 5 MG PO TABS: 5.0000 mg | ORAL_TABLET | Freq: Once | ORAL | Status: DC | PRN

## 2021-01-29 MED ORDER — ONDANSETRON HCL 4 MG/2ML IJ SOLN: 4.0000 mg | Freq: Once | INTRAMUSCULAR | Status: DC | PRN

## 2021-01-29 MED ORDER — ENSURE PRE-SURGERY PO LIQD: 296.0000 mL | Freq: Once | ORAL | Status: DC

## 2021-01-29 MED ORDER — FENTANYL CITRATE (PF) 100 MCG/2ML IJ SOLN
50.0000 ug | Freq: Once | INTRAMUSCULAR | Status: AC
Start: 2021-01-29 — End: 2021-01-29
  Administered 2021-01-29: 50 ug via INTRAVENOUS

## 2021-01-29 MED ORDER — BUPIVACAINE HCL (PF) 0.25 % IJ SOLN
INTRAMUSCULAR | Status: AC
  Filled 2021-01-29: qty 60

## 2021-01-29 MED ORDER — ONDANSETRON HCL 4 MG/2ML IJ SOLN
INTRAMUSCULAR | Status: DC | PRN
  Administered 2021-01-29: 4 mg via INTRAVENOUS

## 2021-01-29 MED ORDER — LIDOCAINE HCL (CARDIAC) PF 100 MG/5ML IV SOSY
PREFILLED_SYRINGE | INTRAVENOUS | Status: DC | PRN
  Administered 2021-01-29: 60 mg via INTRATRACHEAL

## 2021-01-29 MED ORDER — CEFAZOLIN SODIUM-DEXTROSE 2-4 GM/100ML-% IV SOLN
INTRAVENOUS | Status: AC
  Filled 2021-01-29: qty 100

## 2021-01-29 MED ORDER — BUPIVACAINE LIPOSOME 1.3 % IJ SUSP
INTRAMUSCULAR | Status: DC | PRN
  Administered 2021-01-29: 10 mL

## 2021-01-29 MED ORDER — PROPOFOL 10 MG/ML IV BOLUS
INTRAVENOUS | Status: AC
  Filled 2021-01-29: qty 20

## 2021-01-29 MED ORDER — OXYCODONE HCL 5 MG PO TABS: 5.0000 mg | ORAL_TABLET | Freq: Four times a day (QID) | ORAL | 0 refills | Status: DC | PRN

## 2021-01-29 MED ORDER — BUPIVACAINE HCL (PF) 0.5 % IJ SOLN
INTRAMUSCULAR | Status: DC | PRN
  Administered 2021-01-29: 15 mL

## 2021-01-29 MED ORDER — ONDANSETRON HCL 4 MG/2ML IJ SOLN
INTRAMUSCULAR | Status: AC
  Filled 2021-01-29: qty 2

## 2021-01-29 MED ORDER — FENTANYL CITRATE (PF) 100 MCG/2ML IJ SOLN
INTRAMUSCULAR | Status: DC | PRN
  Administered 2021-01-29 (×3): 25 ug via INTRAVENOUS

## 2021-01-29 MED ORDER — ACETAMINOPHEN 500 MG PO TABS
1000.0000 mg | ORAL_TABLET | ORAL | Status: AC
  Administered 2021-01-29: 1000 mg via ORAL

## 2021-01-29 MED ORDER — PROPOFOL 10 MG/ML IV BOLUS
INTRAVENOUS | Status: DC | PRN
  Administered 2021-01-29: 140 mg via INTRAVENOUS

## 2021-01-29 MED ORDER — DEXAMETHASONE SODIUM PHOSPHATE 10 MG/ML IJ SOLN
INTRAMUSCULAR | Status: DC | PRN
  Administered 2021-01-29: 5 mg via INTRAVENOUS

## 2021-01-29 MED ORDER — FENTANYL CITRATE (PF) 100 MCG/2ML IJ SOLN: 25.0000 ug | INTRAMUSCULAR | Status: DC | PRN

## 2021-01-29 MED ORDER — EPHEDRINE SULFATE 50 MG/ML IJ SOLN
INTRAMUSCULAR | Status: DC | PRN
  Administered 2021-01-29: 10 mg via INTRAVENOUS

## 2021-01-29 MED ORDER — EPHEDRINE 5 MG/ML INJ
INTRAVENOUS | Status: AC
  Filled 2021-01-29: qty 10

## 2021-01-29 MED ORDER — MIDAZOLAM HCL 2 MG/2ML IJ SOLN
1.0000 mg | Freq: Once | INTRAMUSCULAR | Status: AC
  Administered 2021-01-29: 1 mg via INTRAVENOUS

## 2021-01-29 MED ORDER — CEFAZOLIN SODIUM-DEXTROSE 2-4 GM/100ML-% IV SOLN
2.0000 g | INTRAVENOUS | Status: AC
  Administered 2021-01-29: 2 g via INTRAVENOUS

## 2021-01-29 MED ORDER — SODIUM CHLORIDE (PF) 0.9 % IJ SOLN
INTRAMUSCULAR | Status: AC
  Filled 2021-01-29: qty 10

## 2021-01-29 MED ORDER — CHLORHEXIDINE GLUCONATE CLOTH 2 % EX PADS: 6.0000 | MEDICATED_PAD | Freq: Once | CUTANEOUS | Status: DC

## 2021-01-29 MED ORDER — OXYCODONE HCL 5 MG/5ML PO SOLN
5.0000 mg | Freq: Once | ORAL | Status: DC | PRN
Start: 2021-01-29 — End: 2021-01-29

## 2021-01-29 MED ORDER — LACTATED RINGERS IV SOLN: INTRAVENOUS | Status: DC

## 2021-01-29 SURGICAL SUPPLY — 46 items
ADH SKN CLS APL DERMABOND .7 (GAUZE/BANDAGES/DRESSINGS) ×1
APL PRP STRL LF DISP 70% ISPRP (MISCELLANEOUS) ×1
BLADE CLIPPER SURG (BLADE) ×1 IMPLANT
BLADE SURG 15 STRL LF DISP TIS (BLADE) ×1 IMPLANT
BLADE SURG 15 STRL SS (BLADE) ×2
CHLORAPREP W/TINT 26 (MISCELLANEOUS) ×2 IMPLANT
COVER BACK TABLE 60X90IN (DRAPES) ×2 IMPLANT
COVER MAYO STAND STRL (DRAPES) ×2 IMPLANT
DECANTER SPIKE VIAL GLASS SM (MISCELLANEOUS) IMPLANT
DERMABOND ADVANCED (GAUZE/BANDAGES/DRESSINGS) ×1
DERMABOND ADVANCED .7 DNX12 (GAUZE/BANDAGES/DRESSINGS) ×1 IMPLANT
DRAIN PENROSE 1/2X12 LTX STRL (WOUND CARE) ×1 IMPLANT
DRAPE LAPAROTOMY TRNSV 102X78 (DRAPES) ×2 IMPLANT
DRAPE UTILITY XL STRL (DRAPES) ×2 IMPLANT
ELECT COATED BLADE 2.86 ST (ELECTRODE) ×2 IMPLANT
ELECT REM PT RETURN 9FT ADLT (ELECTROSURGICAL) ×2
ELECTRODE REM PT RTRN 9FT ADLT (ELECTROSURGICAL) ×1 IMPLANT
GLOVE SURG ENC MOIS LTX SZ7 (GLOVE) ×1 IMPLANT
GLOVE SURG POLYISO LF SZ7 (GLOVE) ×2 IMPLANT
GLOVE SURG POLYISO LF SZ8 (GLOVE) ×2 IMPLANT
GLOVE SURG UNDER POLY LF SZ7.5 (GLOVE) ×2 IMPLANT
GOWN STRL REUS W/ TWL LRG LVL3 (GOWN DISPOSABLE) ×2 IMPLANT
GOWN STRL REUS W/TWL 2XL LVL3 (GOWN DISPOSABLE) ×1 IMPLANT
GOWN STRL REUS W/TWL LRG LVL3 (GOWN DISPOSABLE) ×4
LOOP VESSEL MAXI BLUE (MISCELLANEOUS) ×1 IMPLANT
MESH ULTRAPRO 3X6 7.6X15CM (Mesh General) ×1 IMPLANT
NEEDLE HYPO 22GX1.5 SAFETY (NEEDLE) ×2 IMPLANT
NS IRRIG 1000ML POUR BTL (IV SOLUTION) ×1 IMPLANT
PACK BASIN DAY SURGERY FS (CUSTOM PROCEDURE TRAY) ×2 IMPLANT
PENCIL SMOKE EVACUATOR (MISCELLANEOUS) ×2 IMPLANT
SLEEVE SCD COMPRESS KNEE MED (STOCKING) ×1 IMPLANT
SPONGE T-LAP 4X18 ~~LOC~~+RFID (SPONGE) ×2 IMPLANT
STRIP CLOSURE SKIN 1/2X4 (GAUZE/BANDAGES/DRESSINGS) ×1 IMPLANT
SUT MNCRL AB 4-0 PS2 18 (SUTURE) ×2 IMPLANT
SUT SILK 2 0 SH (SUTURE) IMPLANT
SUT VIC AB 0 SH 27 (SUTURE) IMPLANT
SUT VIC AB 2-0 SH 18 (SUTURE) ×3 IMPLANT
SUT VIC AB 2-0 SH 27 (SUTURE) ×2
SUT VIC AB 2-0 SH 27XBRD (SUTURE) IMPLANT
SUT VIC AB 3-0 SH 27 (SUTURE) ×2
SUT VIC AB 3-0 SH 27X BRD (SUTURE) ×1 IMPLANT
SUT VICRYL AB 3 0 TIES (SUTURE) IMPLANT
SYR CONTROL 10ML LL (SYRINGE) ×2 IMPLANT
TOWEL GREEN STERILE FF (TOWEL DISPOSABLE) ×2 IMPLANT
TUBE CONNECTING 20X1/4 (TUBING) ×1 IMPLANT
YANKAUER SUCT BULB TIP NO VENT (SUCTIONS) ×1 IMPLANT

## 2021-01-29 NOTE — Anesthesia Preprocedure Evaluation (Addendum)
Anesthesia Evaluation  Patient identified by MRN, date of birth, ID band Patient awake    Reviewed: Allergy & Precautions, NPO status , Patient's Chart, lab work & pertinent test results  History of Anesthesia Complications Negative for: history of anesthetic complications  Airway Mallampati: II  TM Distance: >3 FB Neck ROM: Full    Dental  (+) Dental Advisory Given   Pulmonary former smoker,    Pulmonary exam normal        Cardiovascular hypertension, Pt. on medications + dysrhythmias Atrial Fibrillation + Valvular Problems/Murmurs  Rhythm:Irregular Rate:Normal   '22 TTE - EF 60 to 65%. Grade I diastolic dysfunction (impaired relaxation). RV size is mildly enlarged. There is normal pulmonary artery systolic pressure. LA and RA were moderately dilated. Mild mitral valve regurgitation. There is mild holosystolic prolapse of both leaflets of the mitral valve. Tricuspid valve regurgitation is moderate.  Mild aortic valve sclerosis is present, with no evidence of aortic valve stenosis. There is borderline dilatation of the ascending aorta, measuring 38 mm.     Neuro/Psych  Headaches, PSYCHIATRIC DISORDERS Anxiety    GI/Hepatic Neg liver ROS, GERD  Medicated and Controlled,  Endo/Other  negative endocrine ROS  Renal/GU negative Renal ROS     Musculoskeletal  (+) Arthritis , Rheumatoid disorders,    Abdominal   Peds  Hematology  On eliquis    Anesthesia Other Findings   Reproductive/Obstetrics                            Anesthesia Physical Anesthesia Plan  ASA: 3  Anesthesia Plan: General   Post-op Pain Management:  Regional for Post-op pain   Induction: Intravenous  PONV Risk Score and Plan: 2 and Treatment may vary due to age or medical condition, Ondansetron and Dexamethasone  Airway Management Planned: LMA  Additional Equipment: None  Intra-op Plan:   Post-operative Plan:  Extubation in OR  Informed Consent: I have reviewed the patients History and Physical, chart, labs and discussed the procedure including the risks, benefits and alternatives for the proposed anesthesia with the patient or authorized representative who has indicated his/her understanding and acceptance.     Dental advisory given  Plan Discussed with: CRNA, Anesthesiologist and Surgeon  Anesthesia Plan Comments:       Anesthesia Quick Evaluation

## 2021-01-29 NOTE — Discharge Instructions (Addendum)
[facility]  UMBILICAL OR INGUINAL HERNIA REPAIR: POST OP INSTRUCTIONS  Always review your discharge instruction sheet given to you by the facility where your surgery was performed. IF YOU HAVE DISABILITY OR FAMILY LEAVE FORMS, YOU MUST BRING THEM TO THE OFFICE FOR PROCESSING.   DO NOT GIVE THEM TO YOUR DOCTOR.  A  prescription for pain medication may be given to you upon discharge.  Take your pain medication as prescribed, if needed.  If narcotic pain medicine is not needed, then you may take acetaminophen (Tylenol), naprosyn (Alleve) or ibuprofen (Advil) as needed. Take your usually prescribed medications unless otherwise directed. If you need a refill on your pain medication, please contact your pharmacy.  They will contact our office to request authorization. Prescriptions will not be filled after 5 pm or on week-ends. You should follow a light diet the first 24 hours after arrival home, such as soup and crackers, etc.  Be sure to include lots of fluids daily.  Resume your normal diet the day after surgery. Most patients will experience some swelling and bruising around the umbilicus or in the groin and scrotum.  Ice packs and reclining will help.  Swelling and bruising can take several days to resolve.  It is common to experience some constipation if taking pain medication after surgery.  Increasing fluid intake and taking a stool softener (such as Colace) will usually help or prevent this problem from occurring.  A mild laxative (Milk of Magnesia or Miralax) should be taken according to package directions if there are no bowel movements after 48 hours. Unless discharge instructions indicate otherwise, you may remove your bandages 48 hours after surgery, and you may shower at that time.  You may have steri-strips (small skin tapes) in place directly over the incision.  These strips should be left on the skin for 7-10 days and will come off on their own.  If your surgeon used  skin glue on the incision, you may shower in 24 hours.  The glue will flake off over the next 2-3 weeks.  Any sutures or staples will be removed at the office during your follow-up visit. ACTIVITIES:  You may resume regular (light) daily activities beginning the next day--such as daily self-care, walking, climbing stairs--gradually increasing activities as tolerated.  You may have sexual intercourse when it is comfortable.  Refrain from any heavy lifting or straining until approved by your doctor. You may drive when you are no longer taking prescription pain medication, you can comfortably wear a seatbelt, and you can safely maneuver your car and apply brakes. RETURN TO WORK:  __________________________________________________________ Dennis Bast should see your doctor in the office for a follow-up appointment approximately 2-3 weeks after your surgery.  Make sure that you call for this appointment within a day or two after you arrive home to insure a convenient appointment time. OTHER INSTRUCTIONS:  __________________________________________________________________________________________________________________________________________________________________________________________  WHEN TO CALL YOUR DOCTOR: Fever over 101.0 Inability to urinate Nausea and/or vomiting Extreme swelling or bruising Continued bleeding from incision. Increased pain, redness, or drainage from the incision  The clinic staff is available to answer your questions during regular business hours.  Please don't hesitate to call and ask to speak to one of the nurses for clinical concerns.  If you have a medical emergency, go to the nearest emergency room or call 911.  A surgeon from [facility] is always on call at the hospital   [address] [place], [place], [place]  EO:[id number] ?  [address], [place], [place]   [address] [phone] ? [phone] ? FAX [phone] Web site:  [email or web address]   May take Tylenol after 6pm, if needed.    Post Anesthesia Home Care Instructions  Activity: Get plenty of rest for the remainder of the day. A responsible individual must stay with you for 24 hours following the procedure.  For the next 24 hours, DO NOT: -Drive a car -Paediatric nurse -Drink alcoholic beverages -Take any medication unless instructed by your physician -Make any legal decisions or sign important papers.  Meals: Start with liquid foods such as gelatin or soup. Progress to regular foods as tolerated. Avoid greasy, spicy, heavy foods. If nausea and/or vomiting occur, drink only clear liquids until the nausea and/or vomiting subsides. Call your physician if vomiting continues.  Special Instructions/Symptoms: Your throat may feel dry or sore from the anesthesia or the breathing tube placed in your throat during surgery. If this causes discomfort, gargle with warm salt water. The discomfort should disappear within 24 hours.  If you had a scopolamine patch placed behind your ear for the management of post- operative nausea and/or vomiting:  1. The medication in the patch is effective for 72 hours, after which it should be removed.  Wrap patch in a tissue and discard in the trash. Wash hands thoroughly with soap and water. 2. You may remove the patch earlier than 72 hours if you experience unpleasant side effects which may include dry mouth, dizziness or visual disturbances. 3. Avoid touching the patch. Wash your hands with soap and water after contact with the patch.    Regional Anesthesia Blocks  1. Numbness or the inability to move the "blocked" extremity may last from 3-48 hours after placement. The length of time depends on the medication injected and your individual response to the medication. If the numbness is not going away after 48 hours, call your surgeon.  2. The extremity that is blocked will need to be protected until the numbness  is gone and the  Strength has returned. Because you cannot feel it, you will need to take extra care to avoid injury. Because it may be weak, you may have difficulty moving it or using it. You may not know what position it is in without looking at it while the block is in effect.  3. For blocks in the legs and feet, returning to weight bearing and walking needs to be done carefully. You will need to wait until the numbness is entirely gone and the strength has returned. You should be able to move your leg and foot normally before you try and bear weight or walk. You will need someone to be with you when you first try to ensure you do not fall and possibly risk injury.  4. Bruising and tenderness at the needle site are common side effects and will resolve in a few days.  5. Persistent numbness or new problems with movement should be communicated to the surgeon or the [facility] [phone] [place] [phone]). Information for Discharge Teaching: EXPAREL (bupivacaine liposome injectable suspension)   Your surgeon or anesthesiologist gave you EXPAREL(bupivacaine) to help control your pain after surgery.  EXPAREL is a local anesthetic that provides pain relief by numbing the tissue around the surgical site. EXPAREL is designed to release pain medication over time and can control pain for up to 72 hours. Depending on how you respond to EXPAREL, you may require less pain medication  during your recovery.  Possible side effects: Temporary loss of sensation or ability to move in the area where bupivacaine was injected. Nausea, vomiting, constipation Rarely, numbness and tingling in your mouth or lips, lightheadedness, or anxiety may occur. Call your doctor right away if you think you may be experiencing any of these sensations, or if you have other questions regarding possible side effects.  Follow all other discharge instructions given to you by your surgeon or nurse.  Eat a healthy diet and drink plenty of water or other fluids.  If you return to the hospital for any reason within 96 hours following the administration of EXPAREL, it is important for health care providers to know that you have received this anesthetic. A teal colored band has been placed on your arm with the date, time and amount of EXPAREL you have received in order to alert and inform your health care providers. Please leave this armband in place for the full 96 hours following administration, and then you may remove the band.

## 2021-01-29 NOTE — Op Note (Signed)
Preoperative diagnosis: lih Postoperative diagnosis: indirect LIH Procedure: LIH repair with Ultrapro mesh patch Surgeon: Dr Serita Grammes Resident Surgeon: Otho Bellows, MD I was personally present during the key and critical portions of this procedure and immediately available throughout the entire procedure, as documented in my operative note.  EBL: minimal Complications none Drains none Specimens none Sponge and needle count correct Dispo recovery stable.  Indications: Richard [name] referred last year by Dr Ashby Dawes for [facility].  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 he retired from [employer]. I saw him last year with [facility] that was symptomatic.  he was cleared by cards. had some testicular pain with neg u/s. this is now better. the hernia is getting larger now, remains symptomatic and he would like to consider repair.   We discussed open repair  Procedure: After informed consent obtained patient taken to the OR. He had undergone a TAP block.  He was given antibiotics and had SCDs in place. He was placed under general anesthesia without complication. He was prepped and draped in standard sterile surgical fashion. Surgical timeout was performed.  We infiltrated marcaine in the left groin skin. An incision was made and carried to the external oblique. This was entered sharply through the external ring. The spermatic cord was encircled with a vessel loop. The floor was weak but without a direct hernia.  The cord was dissected and there was a large cord lipoma that was accounting for his symptoms. This was reduced in its entirety.  The internal ring was closed down with 2-0 vicryl sutures. An Ultrapro mesh patch was then fashioned. This was sewn to the pubic tubercle with overlap. A T cut was made and wrapped around the spermatic cord. The mesh was sewn to the shelving edge with 2-0 vicryl suture as well.  The cut ends were sewn together with vicryl suture and the mesh tacked down superiorly. Laterally  this was laid under the external oblique. The mesh was in good position. Hemostasis observed. The external oblique was closed with 2-0 vicryl, scarpas with 3-0 vicryl and the skin with 4-0 monocryl. Glue and steristrips applied. He tolerated well was extubated and transferred to recovery stable

## 2021-01-29 NOTE — Progress Notes (Signed)
Assisted Dr. Fransisco Beau with left, ultrasound guided, transabdominal plane block. Side rails up, monitors on throughout procedure. See vital signs in flow sheet. Tolerated Procedure well.

## 2021-01-29 NOTE — Transfer of Care (Signed)
Immediate Anesthesia Transfer of Care Note  Patient: Richard [name]  Procedure(s) Performed: LEFT INGUINAL HERNIA REPAIR WITH MESH (Left: Groin)  Patient Location: PACU  Anesthesia Type:General  Level of Consciousness: drowsy and patient cooperative  Airway & Oxygen Therapy: Patient Spontanous Breathing and Patient connected to face mask oxygen  Post-op Assessment: Report given to RN and Post -op Vital signs reviewed and stable  Post vital signs: Reviewed and stable  Last Vitals:  Vitals Value Taken Time  BP    Temp    Pulse 64 01/29/21 1358  Resp 16 01/29/21 1358  SpO2 99 % 01/29/21 1358  Vitals shown include unvalidated device data.  Last Pain:  Vitals:   01/29/21 1147  TempSrc: Oral  PainSc: 0-No pain         Complications: No notable events documented.

## 2021-01-29 NOTE — Anesthesia Procedure Notes (Signed)
Anesthesia Regional Block: TAP block   Pre-Anesthetic Checklist: , timeout performed,  Correct Patient, Correct Site, Correct Laterality,  Correct Procedure, Correct Position, site marked,  Risks and benefits discussed,  Surgical consent,  Pre-op evaluation,  At surgeon's request and post-op pain management  Laterality: Left  Prep: chloraprep       Needles:  Injection technique: Single-shot  Needle Type: Echogenic Needle     Needle Length: 10cm  Needle Gauge: 21     Additional Needles:   Narrative:  Start time: 01/29/2021 12:25 PM End time: 01/29/2021 12:28 PM Injection made incrementally with aspirations every 5 mL.  Performed by: Personally  Anesthesiologist: Audry Pili, MD  Additional Notes: No pain on injection. No increased resistance to injection. Injection made in 5cc increments. Good needle visualization. Patient tolerated the procedure well.

## 2021-01-29 NOTE — H&P (Signed)
Richard [name] is an 75 y.o. male.   Chief Complaint: lih HPI:  63 yom referred last year by Dr Ashby Dawes for [facility].  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 he retired from [employer]. I saw him last year with [facility] that was symptomatic.  he was cleared by cards. had some testicular pain with neg u/s. this is now better. his wife ended up needing pituitary surgery and he delayed due to covid.  the hernia is getting larger now, remains symptomatic and he would like to consider repair.                 Past Medical History:  Diagnosis Date   Arthritis      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   Atrial fibrillation (HCC)     Back pain     Dyspnea      history of covid 09/2018 and with extertion   Dysrhythmia     GERD (gastroesophageal reflux disease)     Headache     Hypertension     Pain      PAIN AND OA RT KNEE   Pneumonia      YEARS AGO   Psoriasis     Snoring 04/27/2014           Past Surgical History:  Procedure Laterality Date   KNEE ARTHROSCOPY Right 01/31/2014    Procedure: RIGHT ARTHROSCOPY KNEE WITH DEBRIDMENT CHONDROPLASTY;  Surgeon: [name], MD;  Location: [facility];  Service: Orthopedics;  Laterality: Right;   LUMP REMOVED FROM LEFT BREAST Left 2008    STATES SURGERY  AT [facility] - LOCAL- NOT CANCER 5 OR MORE YRS AGO   MORTON'S NEUROMA REMOVED       TONSILLECTOMY        AS A CHILD           Family History  Problem Relation Age of Onset   Diabetes Mother     Hypertension Mother     Heart disease Mother          has a stent   Stroke Father 38   Hypertension Father      Social History:  reports that he quit smoking about 32 years ago. His smoking use included cigarettes. He smoked an average of .25 packs per day. He has never used smokeless tobacco. He reports current alcohol use. He reports that he does not use drugs.   Allergies:       Allergies  Allergen Reactions   Azithromycin Diarrhea and Nausea And Vomiting   Latex        WHEN PT USED TO WEAR  LATEX GLOVES AT WORK HE NOTICED SKIN REDNESS AND ITCHING OF HANDS.   Prednisone Other (See Comments)      "makes me angry and I feel weird."            Medications Prior to Admission  Medication Sig Dispense Refill   apixaban (ELIQUIS) 5 MG TABS tablet TAKE 1 TABLET(5 MG) BY MOUTH TWICE DAILY 180 tablet 1   CALCIUM-MAGNESIUM-ZINC PO Take 1 tablet by mouth 2 (two) times daily.       cholecalciferol (VITAMIN D) 1000 UNITS tablet Take 1,000 Units by mouth daily.       Cod Liver Oil CAPS         cyclobenzaprine (FLEXERIL) 10 MG tablet Take 10 mg by mouth 3 (three) times daily as needed for muscle spasms (BACK).       HYDROcodone-acetaminophen (NORCO/VICODIN) 5-325 MG  tablet Take 1 tablet by mouth daily as needed.       hydroxychloroquine (PLAQUENIL) 200 MG tablet Take 200 mg by mouth 2 (two) times daily.       lisinopril-hydrochlorothiazide (PRINZIDE,ZESTORETIC) 10-12.5 MG per tablet Take 1 tablet by mouth every morning.       Lutein-Zeaxanthin 25-5 MG CAPS Take by mouth.       misoprostol (CYTOTEC) 100 MCG tablet Take 100 mcg by mouth 2 (two) times daily. Pt taking as needed.       Multiple Vitamin (MULTIVITAMIN WITH MINERALS) TABS tablet Take 1 tablet by mouth daily.       omeprazole (PRILOSEC) 40 MG capsule TK ONE C PO  ONCE A DAY       pravastatin (PRAVACHOL) 20 MG tablet Take 20 mg by mouth daily.       vitamin B-12 (CYANOCOBALAMIN) 1000 MCG tablet Take 1,000 mcg by mouth daily.       triamcinolone cream (KENALOG) 0.1 % Apply 1 application topically daily. Apply to psoriasis rash (Patient taking differently: Apply 1 application topically daily. Apply to psoriasis rash. Pt takes as needed.) 30 g 1      Lab Results Last 48 Hours  No results found for this or any previous visit (from the past 22 hour(s)).   Imaging Results (Last 48 hours)  No results found.     Review of Systems  negative  Blood pressure 122/83, pulse 69, temperature 99.3 F (37.4 C), temperature source Oral, resp.  rate 13, height '5\' 10"'$  (1.778 m), weight 74 kg, SpO2 98 %. Physical Exam  General Mental Status - Alert. Orientation - Oriented X3. Abdomen Note:  soft nt/nd no rih moderate reducible lih Pulm effort normal Cv regular   Assessment/Plan LEFT INGUINAL HERNIA (K40.90) LIH with mesh needs cardiac clearance, plan stop elquis 48 hours prior and start day after surgery We discussed observation versus repair. We discussed both laparoscopic and open inguinal hernia repairs. I described the procedure in detail. The patient was given educational material. Goals should be achieved with surgery. We discussed the usage of mesh and the rationale behind that. We went over the pathophysiology of inguinal hernia. We have elected to perform open inguinal hernia repair with mesh. We discussed the risks including bleeding, infection, recurrence, postoperative pain and chronic groin pain, testicular injury, urinary retention, numbness in groin and around incision.   Rolm Bookbinder, MD 01/29/2021, 12:26 PM

## 2021-01-29 NOTE — Interval H&P Note (Signed)
History and Physical Interval Note:  01/29/2021 12:32 PM  Richard [name]  has presented today for surgery, with the diagnosis of LEFT INGUINAL HERNIA.  The various methods of treatment have been discussed with the patient and family. After consideration of risks, benefits and other options for treatment, the patient has consented to  Procedure(s): LEFT INGUINAL HERNIA REPAIR WITH MESH (Left) as a surgical intervention.  The patient's history has been reviewed, patient examined, no change in status, stable for surgery.  I have reviewed the patient's chart and labs.  Questions were answered to the patient's satisfaction.     Rolm Bookbinder

## 2021-01-29 NOTE — Anesthesia Procedure Notes (Signed)
Procedure Name: LMA Insertion Date/Time: 01/29/2021 12:45 PM Performed by: Glory Buff, CRNA Pre-anesthesia Checklist: Patient identified, Emergency Drugs available, Suction available and Patient being monitored Patient Re-evaluated:Patient Re-evaluated prior to induction Oxygen Delivery Method: Circle system utilized Preoxygenation: Pre-oxygenation with 100% oxygen Induction Type: IV induction LMA: LMA inserted LMA Size: 4.0 Number of attempts: 1 Placement Confirmation: positive ETCO2 Tube secured with: Tape Dental Injury: Teeth and Oropharynx as per pre-operative assessment

## 2021-01-29 NOTE — Anesthesia Postprocedure Evaluation (Signed)
Anesthesia Post Note  Patient: Holmes Pilotti  Procedure(s) Performed: LEFT INGUINAL HERNIA REPAIR WITH MESH (Left: Groin)     Patient location during evaluation: PACU Anesthesia Type: General Level of consciousness: awake and alert Pain management: pain level controlled Vital Signs Assessment: post-procedure vital signs reviewed and stable Respiratory status: spontaneous breathing, nonlabored ventilation and respiratory function stable Cardiovascular status: stable and blood pressure returned to baseline Anesthetic complications: no   No notable events documented.  Last Vitals:  Vitals:   01/29/21 1400 01/29/21 1415  BP: 121/73 113/69  Pulse: 62 (!) 58  Resp: 14 12  Temp:    SpO2: 99% 97%    Last Pain:  Vitals:   01/29/21 1415  TempSrc:   PainSc: 0-No pain                 Audry Pili

## 2021-01-30 ENCOUNTER — Encounter (HOSPITAL_BASED_OUTPATIENT_CLINIC_OR_DEPARTMENT_OTHER): Payer: Self-pay | Admitting: General Surgery

## 2021-02-10 DIAGNOSIS — Z79899 Other long term (current) drug therapy: Secondary | ICD-10-CM | POA: Diagnosis not present

## 2021-02-10 DIAGNOSIS — M47817 Spondylosis without myelopathy or radiculopathy, lumbosacral region: Secondary | ICD-10-CM | POA: Diagnosis not present

## 2021-02-25 DIAGNOSIS — I10 Essential (primary) hypertension: Secondary | ICD-10-CM | POA: Diagnosis not present

## 2021-02-25 DIAGNOSIS — T63441A Toxic effect of venom of bees, accidental (unintentional), initial encounter: Secondary | ICD-10-CM | POA: Diagnosis not present

## 2021-03-07 DIAGNOSIS — E782 Mixed hyperlipidemia: Secondary | ICD-10-CM | POA: Diagnosis not present

## 2021-03-07 DIAGNOSIS — I10 Essential (primary) hypertension: Secondary | ICD-10-CM | POA: Diagnosis not present

## 2021-03-14 DIAGNOSIS — D692 Other nonthrombocytopenic purpura: Secondary | ICD-10-CM | POA: Diagnosis not present

## 2021-03-14 DIAGNOSIS — Z23 Encounter for immunization: Secondary | ICD-10-CM | POA: Diagnosis not present

## 2021-03-14 DIAGNOSIS — I480 Paroxysmal atrial fibrillation: Secondary | ICD-10-CM | POA: Diagnosis not present

## 2021-03-14 DIAGNOSIS — E782 Mixed hyperlipidemia: Secondary | ICD-10-CM | POA: Diagnosis not present

## 2021-03-14 DIAGNOSIS — I10 Essential (primary) hypertension: Secondary | ICD-10-CM | POA: Diagnosis not present

## 2021-03-14 DIAGNOSIS — L4050 Arthropathic psoriasis, unspecified: Secondary | ICD-10-CM | POA: Diagnosis not present

## 2021-04-11 ENCOUNTER — Telehealth: Payer: Self-pay | Admitting: Internal Medicine

## 2021-04-11 DIAGNOSIS — M170 Bilateral primary osteoarthritis of knee: Secondary | ICD-10-CM | POA: Diagnosis not present

## 2021-04-11 NOTE — Telephone Encounter (Signed)
Pt.'s wife had appt on 10.7.22. Per Dr. Sharlet Salina, pt. Was told he could schedule a new pt. Appointment in late Feb. Or beginning of March. Dr. Sharlet Salina documented on pt. Wife AVS.

## 2021-04-16 ENCOUNTER — Other Ambulatory Visit: Payer: Self-pay | Admitting: Cardiology

## 2021-04-16 DIAGNOSIS — I4891 Unspecified atrial fibrillation: Secondary | ICD-10-CM

## 2021-04-16 NOTE — Telephone Encounter (Signed)
Prescription refill request for Eliquis received. Indication:atrial fib Last office visit:6/22 Scr:0.8 Age: 75 Weight:74 kg  Prescription refilled

## 2021-04-23 DIAGNOSIS — H35371 Puckering of macula, right eye: Secondary | ICD-10-CM | POA: Diagnosis not present

## 2021-04-23 DIAGNOSIS — H2513 Age-related nuclear cataract, bilateral: Secondary | ICD-10-CM | POA: Diagnosis not present

## 2021-04-23 DIAGNOSIS — H43813 Vitreous degeneration, bilateral: Secondary | ICD-10-CM | POA: Diagnosis not present

## 2021-04-28 ENCOUNTER — Ambulatory Visit: Payer: Medicare Other | Admitting: Podiatry

## 2021-05-01 ENCOUNTER — Ambulatory Visit: Payer: Medicare Other | Admitting: Podiatry

## 2021-05-02 ENCOUNTER — Ambulatory Visit: Payer: Medicare Other | Admitting: Podiatry

## 2021-05-02 ENCOUNTER — Other Ambulatory Visit: Payer: Self-pay

## 2021-05-02 ENCOUNTER — Ambulatory Visit (INDEPENDENT_AMBULATORY_CARE_PROVIDER_SITE_OTHER): Payer: Medicare Other

## 2021-05-02 DIAGNOSIS — M79674 Pain in right toe(s): Secondary | ICD-10-CM

## 2021-05-02 DIAGNOSIS — M79675 Pain in left toe(s): Secondary | ICD-10-CM | POA: Diagnosis not present

## 2021-05-02 DIAGNOSIS — M779 Enthesopathy, unspecified: Secondary | ICD-10-CM

## 2021-05-02 DIAGNOSIS — Z7901 Long term (current) use of anticoagulants: Secondary | ICD-10-CM | POA: Diagnosis not present

## 2021-05-02 DIAGNOSIS — B351 Tinea unguium: Secondary | ICD-10-CM | POA: Diagnosis not present

## 2021-05-04 NOTE — Progress Notes (Signed)
Subjective: 75 y.o. returns the office today for painful, elongated, thickened toenails which he cannot trim himself. Denies any redness or drainage around the nails.  He is also noticing swelling to his left big toe but no redness or warmth.  No recent injury that he reports.  No other concerns. Denies any systemic complaints such as fevers, chills, nausea, vomiting.   PCP: Merrilee Seashore, MD  He is on Eliquis for A-fib  Objective: AAO 3, NAD DP/PT pulses palpable, CRT less than 3 seconds Nails hypertrophic, dystrophic, elongated, brittle, discolored 10. There is tenderness overlying the nails 1-5 bilaterally. There is no surrounding erythema or drainage along the nail sites.  No open lesions or pre-ulcerative lesions are identified. Minimal edema present along the left third toe on the proximal phalanx area.  No erythema or warmth there is no open lesions.  Toes in rectus position.  Decreased range of motion of the MPJ. No pain with calf compression, swelling, warmth, erythema.  Assessment: Patient presents with symptomatic onychomycosis, on Eliquis;  Capsulitis  Plan: -Treatment options including alternatives, risks, complications were discussed -Nails sharply debrided 10 without complication/bleeding. -X-rays obtained and reviewed.  No evidence of acute fracture.  Arthritic changes present. -I do think that his symptoms are likely due to biomechanical changes as well as arthritis.  Recommend a small mount Voltaren on the toe if needed as well as icing to help with any swelling.  Discussed wearing shoes with stiffer sole. -Discussed daily foot inspection. If there are any changes, to call the office immediately.  -Follow-up in 3 months or sooner if any problems are to arise. In the meantime, encouraged to call the office with any questions, concerns, changes symptoms.  Celesta Gentile, DPM

## 2021-05-08 DIAGNOSIS — Z79899 Other long term (current) drug therapy: Secondary | ICD-10-CM | POA: Diagnosis not present

## 2021-05-08 DIAGNOSIS — M2550 Pain in unspecified joint: Secondary | ICD-10-CM | POA: Diagnosis not present

## 2021-05-08 DIAGNOSIS — M47817 Spondylosis without myelopathy or radiculopathy, lumbosacral region: Secondary | ICD-10-CM | POA: Diagnosis not present

## 2021-05-08 DIAGNOSIS — M150 Primary generalized (osteo)arthritis: Secondary | ICD-10-CM | POA: Diagnosis not present

## 2021-05-08 DIAGNOSIS — L4050 Arthropathic psoriasis, unspecified: Secondary | ICD-10-CM | POA: Diagnosis not present

## 2021-05-08 DIAGNOSIS — Z6822 Body mass index (BMI) 22.0-22.9, adult: Secondary | ICD-10-CM | POA: Diagnosis not present

## 2021-05-08 DIAGNOSIS — R350 Frequency of micturition: Secondary | ICD-10-CM | POA: Diagnosis not present

## 2021-05-08 DIAGNOSIS — L409 Psoriasis, unspecified: Secondary | ICD-10-CM | POA: Diagnosis not present

## 2021-06-03 DIAGNOSIS — R1031 Right lower quadrant pain: Secondary | ICD-10-CM | POA: Diagnosis not present

## 2021-06-06 DIAGNOSIS — M170 Bilateral primary osteoarthritis of knee: Secondary | ICD-10-CM | POA: Diagnosis not present

## 2021-06-09 ENCOUNTER — Other Ambulatory Visit: Payer: Self-pay

## 2021-06-09 DIAGNOSIS — I4891 Unspecified atrial fibrillation: Secondary | ICD-10-CM

## 2021-06-09 MED ORDER — APIXABAN 5 MG PO TABS
ORAL_TABLET | ORAL | 1 refills | Status: DC
Start: 2021-06-09 — End: 2021-09-26

## 2021-06-09 NOTE — Telephone Encounter (Signed)
Prescription refill request for Eliquis received. Indication:Atach Last office visit:6/22 Scr:0.8 Age: 75 Weight:74 kg  Prescription refilled

## 2021-06-11 ENCOUNTER — Ambulatory Visit: Payer: BC Managed Care – PPO | Admitting: Cardiology

## 2021-06-13 DIAGNOSIS — M170 Bilateral primary osteoarthritis of knee: Secondary | ICD-10-CM | POA: Diagnosis not present

## 2021-06-16 ENCOUNTER — Ambulatory Visit: Payer: BC Managed Care – PPO | Admitting: Cardiology

## 2021-06-18 ENCOUNTER — Telehealth: Payer: Self-pay | Admitting: Internal Medicine

## 2021-06-19 NOTE — Telephone Encounter (Signed)
We cannot schedule labs prior to his new patient apt but what I had talked to patient/wife about at her visit was that he could come early same day as visit and be checked in. I could then order labs which he would have done. These may or may not be back/resulted at time of visit. Also I do not guarantee that first visit would be a physical this depends on what is discussed at visit.

## 2021-06-20 DIAGNOSIS — M170 Bilateral primary osteoarthritis of knee: Secondary | ICD-10-CM | POA: Diagnosis not present

## 2021-06-20 NOTE — Telephone Encounter (Signed)
Left voice mail message detailing Dr. Nathanial Millman response.

## 2021-07-02 DIAGNOSIS — H43813 Vitreous degeneration, bilateral: Secondary | ICD-10-CM | POA: Diagnosis not present

## 2021-07-02 DIAGNOSIS — H2513 Age-related nuclear cataract, bilateral: Secondary | ICD-10-CM | POA: Diagnosis not present

## 2021-07-02 DIAGNOSIS — H35371 Puckering of macula, right eye: Secondary | ICD-10-CM | POA: Diagnosis not present

## 2021-07-02 DIAGNOSIS — H02846 Edema of left eye, unspecified eyelid: Secondary | ICD-10-CM | POA: Diagnosis not present

## 2021-07-15 ENCOUNTER — Ambulatory Visit: Payer: Medicare Other | Admitting: Cardiology

## 2021-07-15 ENCOUNTER — Encounter: Payer: Self-pay | Admitting: Cardiology

## 2021-07-15 ENCOUNTER — Other Ambulatory Visit: Payer: Self-pay

## 2021-07-15 VITALS — BP 110/68 | HR 63 | Ht 70.0 in | Wt 164.0 lb

## 2021-07-15 DIAGNOSIS — I480 Paroxysmal atrial fibrillation: Secondary | ICD-10-CM | POA: Diagnosis not present

## 2021-07-15 DIAGNOSIS — I059 Rheumatic mitral valve disease, unspecified: Secondary | ICD-10-CM

## 2021-07-15 DIAGNOSIS — E7800 Pure hypercholesterolemia, unspecified: Secondary | ICD-10-CM | POA: Diagnosis not present

## 2021-07-15 DIAGNOSIS — I10 Essential (primary) hypertension: Secondary | ICD-10-CM | POA: Diagnosis not present

## 2021-07-15 NOTE — Progress Notes (Signed)
HPI:FU atrial fibrillation.  Holter monitor February 2021 showed sinus rhythm with PACs, couplets, bigeminy, atrial run and bradycardia during early a.m. hours. Since having Covid March 2020 he has had some fatigue with activities as well as dyspnea with more vigorous activities relieved with rest.  At initial office visit July 2021 patient was found to have newly diagnosed atrial fibrillation. We added apixaban. Abdominal ultrasound August 2021 showed no aneurysm. Echocardiogram January 2022 showed normal LV function, grade 1 diastolic dysfunction, mild right ventricular enlargement, moderate left atrial enlargement, moderate right atrial enlargement, mild mitral regurgitation secondary to bileaflet mitral valve prolapse, moderate tricuspid regurgitation.  Since last seen there is no dyspnea, chest pain, palpitations or syncope.  Current Outpatient Medications  Medication Sig Dispense Refill   apixaban (ELIQUIS) 5 MG TABS tablet Take 1 Tablet (5 mg) By Mouth Twice Daily 180 tablet 1   calcium carbonate (OS-CAL - DOSED IN MG OF ELEMENTAL CALCIUM) 1250 (500 Ca) MG tablet 1 tablet with meals     CALCIUM-MAGNESIUM-ZINC PO Take 1 tablet by mouth 2 (two) times daily.      cholecalciferol (VITAMIN D) 1000 UNITS tablet Take 1,000 Units by mouth daily.      Cod Liver Oil CAPS      cyclobenzaprine (FLEXERIL) 10 MG tablet Take 10 mg by mouth 3 (three) times daily as needed for muscle spasms (BACK).      cyclobenzaprine (FLEXERIL) 10 MG tablet Take 1 tablet by mouth 2 (two) times daily.     HYDROcodone-acetaminophen (NORCO/VICODIN) 5-325 MG tablet Take 1 tablet by mouth daily as needed.     hydroxychloroquine (PLAQUENIL) 200 MG tablet Take 200 mg by mouth 2 (two) times daily.     lisinopril-hydrochlorothiazide (PRINZIDE,ZESTORETIC) 10-12.5 MG per tablet Take 1 tablet by mouth every morning.     Lutein-Zeaxanthin 25-5 MG CAPS Take by mouth.     misoprostol (CYTOTEC) 100 MCG tablet Take 100 mcg by mouth  2 (two) times daily. Pt taking as needed.     Multiple Vitamin (MULTIVITAMIN WITH MINERALS) TABS tablet Take 1 tablet by mouth daily.     omeprazole (PRILOSEC) 40 MG capsule TK ONE C PO  ONCE A DAY     pravastatin (PRAVACHOL) 20 MG tablet Take 20 mg by mouth daily.     triamcinolone cream (KENALOG) 0.1 % Apply 1 application topically daily. Apply to psoriasis rash (Patient taking differently: Apply 1 application topically daily. Apply to psoriasis rash. Pt takes as needed.) 30 g 1   vitamin B-12 (CYANOCOBALAMIN) 1000 MCG tablet Take 1,000 mcg by mouth daily.     No current facility-administered medications for this visit.     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    Atrial fibrillation (HCC)    Back pain    Dyspnea    history of covid 09/2018 and with extertion    Dysrhythmia    GERD (gastroesophageal reflux disease)    Headache    Hypertension    Pain    PAIN AND OA RT KNEE   Pneumonia    YEARS AGO   Psoriasis    Snoring 04/27/2014    Past Surgical History:  Procedure Laterality Date   INGUINAL HERNIA REPAIR Left 01/29/2021   Procedure: LEFT INGUINAL HERNIA REPAIR WITH MESH;  Surgeon: Rolm Bookbinder, MD;  Location: [place];  Service: General;  Laterality: Left;   KNEE ARTHROSCOPY Right  01/31/2014   Procedure: RIGHT ARTHROSCOPY KNEE WITH DEBRIDMENT CHONDROPLASTY;  Surgeon: [name], MD;  Location: [facility];  Service: Orthopedics;  Laterality: Right;   LUMP REMOVED FROM LEFT BREAST Left 2008   STATES SURGERY  AT [facility] - LOCAL- NOT CANCER 5 OR MORE YRS AGO   MORTON'S NEUROMA REMOVED     TONSILLECTOMY     AS A CHILD    Social History   Socioeconomic History   Marital status: Married    Spouse name: Not on file   Number of children: 0   Years of education: Not on file   Highest education level: Not on file  Occupational History   Not on file  Tobacco Use   Smoking status: Former     Packs/day: 0.25    Types: Cigarettes    Quit date: 1990    Years since quitting: 33.0   Smokeless tobacco: Never  Vaping Use   Vaping Use: Never used  Substance and Sexual Activity   Alcohol use: Yes    Comment: QUIT SMOKING ABOUT 15 YRS AGO--WAS ONLY 2 OR 3 CIGARETTES WITH A BEER ON WEEK ENDS   Drug use: No   Sexual activity: Not on file  Other Topics Concern   Not on file  Social History Narrative   Right handed, caffeine 2-3 cups daily, married, no kids, has baby bird.  WC back injury, so not working at this time.  BS in econmics.   Social Determinants of Health   Financial Resource Strain: Not on file  Food Insecurity: Not on file  Transportation Needs: Not on file  Physical Activity: Not on file  Stress: Not on file  Social Connections: Not on file  Intimate Partner Violence: Not on file    Family History  Problem Relation Age of Onset   Diabetes Mother    Hypertension Mother    Heart disease Mother        has a stent    Stroke Father 80   Hypertension Father     ROS: no fevers or chills, productive cough, hemoptysis, dysphasia, odynophagia, melena, hematochezia, dysuria, hematuria, rash, seizure activity, orthopnea, PND, pedal edema, claudication. Remaining systems are negative.  Physical Exam: Well-developed well-nourished in no acute distress.  Skin is warm and dry.  HEENT is normal.  Neck is supple.  Chest is clear to auscultation with normal expansion.  Cardiovascular exam is regular rate and rhythm.  Abdominal exam nontender or distended. No masses palpated. Extremities show no edema. neuro grossly intact  A/P  1 paroxysmal atrial fibrillation-patient is in sinus rhythm today.  Continue apixaban.  2 hypertension-patient's blood pressure is controlled.  Continue present medications and follow-up.  3 hyperlipidemia-continue statin.  3 history of mitral valve prolapse-most recent echocardiogram showed mild mitral regurgitation.  Patient will need  follow-up studies in the future.  Kirk Ruths, MD

## 2021-07-15 NOTE — Patient Instructions (Signed)

## 2021-07-16 DIAGNOSIS — L309 Dermatitis, unspecified: Secondary | ICD-10-CM | POA: Diagnosis not present

## 2021-08-08 ENCOUNTER — Other Ambulatory Visit: Payer: Self-pay

## 2021-08-08 ENCOUNTER — Ambulatory Visit: Payer: Medicare Other | Admitting: Podiatry

## 2021-08-08 DIAGNOSIS — M79675 Pain in left toe(s): Secondary | ICD-10-CM

## 2021-08-08 DIAGNOSIS — Z7901 Long term (current) use of anticoagulants: Secondary | ICD-10-CM | POA: Diagnosis not present

## 2021-08-08 DIAGNOSIS — B351 Tinea unguium: Secondary | ICD-10-CM

## 2021-08-08 DIAGNOSIS — M79674 Pain in right toe(s): Secondary | ICD-10-CM | POA: Diagnosis not present

## 2021-08-11 DIAGNOSIS — M47817 Spondylosis without myelopathy or radiculopathy, lumbosacral region: Secondary | ICD-10-CM | POA: Diagnosis not present

## 2021-08-11 DIAGNOSIS — Z79899 Other long term (current) drug therapy: Secondary | ICD-10-CM | POA: Diagnosis not present

## 2021-08-12 NOTE — Progress Notes (Signed)
Subjective: 76 y.o. returns the office today for painful, elongated, thickened toenails which he cannot trim himself. Denies any redness or drainage around the nails.  The pain that he was having on the left foot has resolved since changing shoes.  No area of pinpoint tenderness bilaterally.  No other concerns. Denies any systemic complaints such as fevers, chills, nausea, vomiting.   PCP: Merrilee Seashore, MD  He is on Eliquis for A-fib  Objective: AAO 3, NAD DP/PT pulses palpable, CRT less than 3 seconds Nails hypertrophic, dystrophic, elongated, brittle, discolored 10. There is tenderness overlying the nails 1-5 bilaterally. There is no surrounding erythema or drainage along the nail sites.  No open lesions or pre-ulcerative lesions are identified. Minimal edema present along the left third toe on the proximal phalanx area.  No erythema or warmth there is no open lesions.  Toes in rectus position.  Decreased range of motion of the MPJ. No pain with calf compression, swelling, warmth, erythema.  Assessment: Patient presents with symptomatic onychomycosis, on Eliquis  Plan: -Treatment options including alternatives, risks, complications were discussed -Nails sharply debrided 10 without complication/bleeding. -Discussed daily foot inspection. If there are any changes, to call the office immediately.  -Follow-up in 3 months or sooner if any problems are to arise. In the meantime, encouraged to call the office with any questions, concerns, changes symptoms.  Celesta Gentile, DPM

## 2021-08-29 DIAGNOSIS — L4050 Arthropathic psoriasis, unspecified: Secondary | ICD-10-CM | POA: Diagnosis not present

## 2021-08-29 DIAGNOSIS — R5383 Other fatigue: Secondary | ICD-10-CM | POA: Diagnosis not present

## 2021-08-29 DIAGNOSIS — E782 Mixed hyperlipidemia: Secondary | ICD-10-CM | POA: Diagnosis not present

## 2021-08-29 DIAGNOSIS — I10 Essential (primary) hypertension: Secondary | ICD-10-CM | POA: Diagnosis not present

## 2021-08-29 DIAGNOSIS — D692 Other nonthrombocytopenic purpura: Secondary | ICD-10-CM | POA: Diagnosis not present

## 2021-08-29 DIAGNOSIS — I480 Paroxysmal atrial fibrillation: Secondary | ICD-10-CM | POA: Diagnosis not present

## 2021-08-29 DIAGNOSIS — Z0000 Encounter for general adult medical examination without abnormal findings: Secondary | ICD-10-CM | POA: Diagnosis not present

## 2021-09-03 ENCOUNTER — Ambulatory Visit: Payer: Medicare Other | Admitting: Internal Medicine

## 2021-09-05 DIAGNOSIS — D6869 Other thrombophilia: Secondary | ICD-10-CM | POA: Diagnosis not present

## 2021-09-05 DIAGNOSIS — I10 Essential (primary) hypertension: Secondary | ICD-10-CM | POA: Diagnosis not present

## 2021-09-05 DIAGNOSIS — L4050 Arthropathic psoriasis, unspecified: Secondary | ICD-10-CM | POA: Diagnosis not present

## 2021-09-05 DIAGNOSIS — D692 Other nonthrombocytopenic purpura: Secondary | ICD-10-CM | POA: Diagnosis not present

## 2021-09-05 DIAGNOSIS — Z0000 Encounter for general adult medical examination without abnormal findings: Secondary | ICD-10-CM | POA: Diagnosis not present

## 2021-09-05 DIAGNOSIS — N182 Chronic kidney disease, stage 2 (mild): Secondary | ICD-10-CM | POA: Diagnosis not present

## 2021-09-05 DIAGNOSIS — I480 Paroxysmal atrial fibrillation: Secondary | ICD-10-CM | POA: Diagnosis not present

## 2021-09-05 DIAGNOSIS — E782 Mixed hyperlipidemia: Secondary | ICD-10-CM | POA: Diagnosis not present

## 2021-09-08 ENCOUNTER — Ambulatory Visit: Payer: Medicare Other | Admitting: Podiatry

## 2021-09-25 ENCOUNTER — Other Ambulatory Visit: Payer: Self-pay | Admitting: Cardiology

## 2021-09-25 DIAGNOSIS — I4891 Unspecified atrial fibrillation: Secondary | ICD-10-CM

## 2021-09-26 NOTE — Telephone Encounter (Signed)
Prescription refill request for Eliquis received. ?Indication:Afib ?Last office visit:1/23 ?Scr:0.8 ?Age: 76 ?Weight:74.4 kg ? ?Prescription refilled ? ?

## 2021-09-29 ENCOUNTER — Telehealth: Payer: Self-pay | Admitting: Cardiology

## 2021-09-29 DIAGNOSIS — Z79899 Other long term (current) drug therapy: Secondary | ICD-10-CM | POA: Diagnosis not present

## 2021-09-29 DIAGNOSIS — M47817 Spondylosis without myelopathy or radiculopathy, lumbosacral region: Secondary | ICD-10-CM | POA: Diagnosis not present

## 2021-09-29 NOTE — Telephone Encounter (Signed)
Left message for pt to call.

## 2021-09-29 NOTE — Telephone Encounter (Signed)
STAT if HR is under 50 or over 120 ?(normal HR is 60-100 beats per minute) ? ?What is your heart rate?  ? ?Do you have a log of your heart rate readings (document readings)?  ?High 70's-80 ? ?Do you have any other symptoms?  ?Headaches  ? ?Patient states his HR has been elevated for the past 3-4 days. ?

## 2021-09-30 ENCOUNTER — Encounter: Payer: Self-pay | Admitting: Cardiology

## 2021-09-30 NOTE — Telephone Encounter (Signed)
Returned call to pt, discussed these BP/HR numbers provided. He will continue to check 1-2 times daily. He will call back with any questions. ?

## 2021-09-30 NOTE — Telephone Encounter (Signed)
Patient calling back. He says today his BP was 120/85 HR 77. He says his HR has been up and down lately. He says it has gone up into the 90's then comes down.  ?

## 2021-09-30 NOTE — Telephone Encounter (Signed)
See my chart message

## 2021-10-01 ENCOUNTER — Ambulatory Visit: Payer: Medicare Other | Admitting: Internal Medicine

## 2021-10-20 DIAGNOSIS — H43813 Vitreous degeneration, bilateral: Secondary | ICD-10-CM | POA: Diagnosis not present

## 2021-10-20 DIAGNOSIS — H35371 Puckering of macula, right eye: Secondary | ICD-10-CM | POA: Diagnosis not present

## 2021-10-20 DIAGNOSIS — H2513 Age-related nuclear cataract, bilateral: Secondary | ICD-10-CM | POA: Diagnosis not present

## 2021-10-22 ENCOUNTER — Ambulatory Visit: Payer: Medicare Other | Admitting: Dermatology

## 2021-10-22 ENCOUNTER — Encounter: Payer: Self-pay | Admitting: Dermatology

## 2021-10-22 DIAGNOSIS — Z1283 Encounter for screening for malignant neoplasm of skin: Secondary | ICD-10-CM

## 2021-10-22 DIAGNOSIS — L400 Psoriasis vulgaris: Secondary | ICD-10-CM

## 2021-10-22 DIAGNOSIS — L308 Other specified dermatitis: Secondary | ICD-10-CM | POA: Diagnosis not present

## 2021-10-22 DIAGNOSIS — L570 Actinic keratosis: Secondary | ICD-10-CM | POA: Diagnosis not present

## 2021-10-22 DIAGNOSIS — D485 Neoplasm of uncertain behavior of skin: Secondary | ICD-10-CM

## 2021-10-22 MED ORDER — CLOBETASOL PROPIONATE 0.05 % EX LIQD: Freq: Two times a day (BID) | CUTANEOUS | 0 refills | Status: DC

## 2021-10-22 NOTE — Patient Instructions (Signed)

## 2021-10-23 ENCOUNTER — Other Ambulatory Visit: Payer: Self-pay | Admitting: Cardiology

## 2021-10-23 DIAGNOSIS — I4891 Unspecified atrial fibrillation: Secondary | ICD-10-CM

## 2021-10-23 NOTE — Telephone Encounter (Signed)
Prescription refill request for Eliquis received. ?Indication:Afib ?Last office visit:1/23 ?Scr:0.8 ?Age: 76 ?Weight:74.4 kg ? ?Prescription refilled ? ?

## 2021-10-27 ENCOUNTER — Telehealth: Payer: Self-pay | Admitting: *Deleted

## 2021-10-27 DIAGNOSIS — L400 Psoriasis vulgaris: Secondary | ICD-10-CM

## 2021-10-27 MED ORDER — CLOBETASOL PROPIONATE 0.05 % EX LIQD: Freq: Two times a day (BID) | CUTANEOUS | 6 refills | Status: AC

## 2021-10-27 NOTE — Telephone Encounter (Signed)
Path to patient. Per Dr.Tafeen he can use the clobetasol that he got for his knees at his appointment for his arm. Patient will call if there is any problems with his prescription. ?

## 2021-10-27 NOTE — Telephone Encounter (Signed)
-----   Message from Lavonna Monarch, MD sent at 10/24/2021  5:54 PM EDT ----- ?I told patient at the time of his visit that clinically his spots look like a mixture of sun damage plus psoriasis-like inflammation and this biopsy supports that.  He should use the clobetasol on that the spots. ?

## 2021-10-27 NOTE — Telephone Encounter (Signed)
Phone call to patient to let him know that we called in his clobetasol again.  ?

## 2021-11-08 ENCOUNTER — Encounter (HOSPITAL_BASED_OUTPATIENT_CLINIC_OR_DEPARTMENT_OTHER): Payer: Self-pay

## 2021-11-08 ENCOUNTER — Emergency Department (HOSPITAL_BASED_OUTPATIENT_CLINIC_OR_DEPARTMENT_OTHER): Payer: Medicare Other

## 2021-11-08 ENCOUNTER — Emergency Department (HOSPITAL_BASED_OUTPATIENT_CLINIC_OR_DEPARTMENT_OTHER)
Admission: EM | Admit: 2021-11-08 | Discharge: 2021-11-09 | Disposition: A | Discharge status: Home / Self Care | Payer: Medicare Other | Attending: Emergency Medicine | Admitting: Emergency Medicine

## 2021-11-08 ENCOUNTER — Other Ambulatory Visit: Payer: Self-pay

## 2021-11-08 DIAGNOSIS — Z7901 Long term (current) use of anticoagulants: Secondary | ICD-10-CM | POA: Insufficient documentation

## 2021-11-08 DIAGNOSIS — S0990XA Unspecified injury of head, initial encounter: Secondary | ICD-10-CM

## 2021-11-08 DIAGNOSIS — W228XXA Striking against or struck by other objects, initial encounter: Secondary | ICD-10-CM | POA: Insufficient documentation

## 2021-11-08 DIAGNOSIS — Z91040 Latex allergy status: Secondary | ICD-10-CM | POA: Diagnosis not present

## 2021-11-08 DIAGNOSIS — Y92000 Kitchen of unspecified non-institutional (private) residence as  the place of occurrence of the external cause: Secondary | ICD-10-CM | POA: Diagnosis not present

## 2021-11-08 NOTE — ED Triage Notes (Addendum)
Pt reports that he hit his head on a wood cabinet at home and his PCP told him to come get a CT scan because he is on blood thinners. Denies loss of consciousness. ?

## 2021-11-08 NOTE — ED Provider Notes (Signed)
? ?[facility]  ?Provider Note ? ?CSN: [id number] ?Arrival date & time: 11/08/21 2310 ? ?History ?Chief Complaint  ?Patient presents with  ? Head Injury  ? ? ?Richard [name] is a 76 y.o. male on Eliquis for afib reports he bumped his head on the trunk of his SUV Friday and then on a cabinet in his kitchen earlier today. He did not have LOC, blurry vision or dizziness. He had a brief episode of nausea but no vomiting and no difficulty walking. He began thinking about being on the blood thinner this evening so he called his PCP who advised him to come to the ED for CT scan. ? ? ?Home Medications ?Prior to Admission medications   ?Medication Sig Start Date End Date Taking? Authorizing Provider  ?apixaban (ELIQUIS) 5 MG TABS tablet TAKE 1 TABLET(5 MG) BY MOUTH TWICE DAILY 10/23/21   Lelon Perla, MD  ?calcium carbonate (OS-CAL - DOSED IN MG OF ELEMENTAL CALCIUM) 1250 (500 Ca) MG tablet 1 tablet with meals    [provider]  ?CALCIUM-MAGNESIUM-ZINC PO Take 1 tablet by mouth 2 (two) times daily.     [provider]  ?cholecalciferol (VITAMIN D) 1000 UNITS tablet Take 1,000 Units by mouth daily.     [provider]  ?Clobetasol Propionate (TEMOVATE) 0.05 % external spray Apply topically 2 (two) times daily. 10/27/21   Lavonna Monarch, MD  ?[facility] Liver Oil CAPS     [provider]  ?cyclobenzaprine (FLEXERIL) 10 MG tablet Take 10 mg by mouth 3 (three) times daily as needed for muscle spasms (BACK).     [provider]  ?cyclobenzaprine (FLEXERIL) 10 MG tablet Take 1 tablet by mouth 2 (two) times daily. 02/04/21   [provider]  ?HYDROcodone-acetaminophen (NORCO/VICODIN) 5-325 MG tablet Take 1 tablet by mouth daily as needed. 02/29/20   [provider]  ?hydroxychloroquine (PLAQUENIL) 200 MG tablet Take 200 mg by mouth 2 (two) times daily.    [provider]  ?lisinopril-hydrochlorothiazide (PRINZIDE,ZESTORETIC) 10-12.5 MG per  tablet Take 1 tablet by mouth every morning.    [provider]  ?Lutein-Zeaxanthin 25-5 MG CAPS Take by mouth.    [provider]  ?misoprostol (CYTOTEC) 100 MCG tablet Take 100 mcg by mouth 2 (two) times daily. Pt taking as needed.    [provider]  ?Multiple Vitamin (MULTIVITAMIN WITH MINERALS) TABS tablet Take 1 tablet by mouth daily.    [provider]  ?omeprazole (PRILOSEC) 40 MG capsule TK ONE C PO  ONCE A DAY 10/28/18   [provider]  ?pravastatin (PRAVACHOL) 20 MG tablet Take 20 mg by mouth daily.    [provider]  ?triamcinolone cream (KENALOG) 0.1 % Apply 1 application topically daily. Apply to psoriasis rash ?Patient taking differently: Apply 1 application. topically daily. Apply to psoriasis rash. Pt takes as needed. 06/20/19   Trula Slade, DPM  ?vitamin B-12 (CYANOCOBALAMIN) 1000 MCG tablet Take 1,000 mcg by mouth daily.    [provider]  ? ? ? ?Allergies    ?Azithromycin, Latex, and Prednisone ? ? ?Review of Systems   ?Review of Systems ?Please see HPI for pertinent positives and negatives ? ?Physical Exam ?BP 106/84 (BP Location: Left Arm)   Pulse 86   Temp 99 ?F (37.2 ?C) (Oral)   Resp 17   Ht '5\' 9"'$  (1.753 m)   Wt 72.6 kg   SpO2 100%   BMI 23.63 kg/m?  ? ?Physical Exam ?Vitals and  nursing note reviewed.  ?Constitutional:   ?   Appearance: Normal appearance.  ?HENT:  ?   Head: Normocephalic and atraumatic.  ?   Comments: No external signs of trauma ?   Nose: Nose normal.  ?   Mouth/Throat:  ?   Mouth: Mucous membranes are moist.  ?Eyes:  ?   Extraocular Movements: Extraocular movements intact.  ?   Conjunctiva/sclera: Conjunctivae normal.  ?Cardiovascular:  ?   Rate and Rhythm: Normal rate.  ?Pulmonary:  ?   Effort: Pulmonary effort is normal.  ?   Breath sounds: Normal breath sounds.  ?Abdominal:  ?   General: Abdomen is flat.  ?   Palpations: Abdomen is soft.  ?   Tenderness: There is no abdominal tenderness.   ?Musculoskeletal:     ?   General: No swelling. Normal range of motion.  ?   Cervical back: Neck supple.  ?Skin: ?   General: Skin is warm and dry.  ?Neurological:  ?   General: No focal deficit present.  ?   Mental Status: He is alert.  ?Psychiatric:     ?   Mood and Affect: Mood normal.  ? ? ?ED Results / Procedures / Treatments   ?EKG ?None ? ?Procedures ?Procedures ? ?Medications Ordered in the ED ?Medications - No data to display ? ?Initial Impression and Plan ? Given age and DOAC use, will send for CT. Minor mechanism with no outward signs of injury.  ? ?ED Course  ? ?Clinical Course as of 11/09/21 0002  ?Sat Nov 08, 2021  ?2359 I personally viewed the images from radiology studies and agree with radiologist interpretation: CT is neg. Plan discharge home with head injury instructions. RTED for any other concerns.  ? [CS]  ?  ?Clinical Course User Index ?[CS] Truddie Hidden, MD  ? ? ? ?MDM Rules/Calculators/A&P ?Medical Decision Making ?Problems Addressed: ?Minor head injury, initial encounter: acute illness or injury ? ?Amount and/or Complexity of Data Reviewed ?Radiology: ordered and independent interpretation performed. Decision-making details documented in ED Course. ? ? ? ?Final Clinical Impression(s) / ED Diagnoses ?Final diagnoses:  ?Minor head injury, initial encounter  ? ? ?Rx / DC Orders ?ED Discharge Orders   ? ? None  ? ?  ? ?  ?Truddie Hidden, MD ?11/09/21 0002 ? ?

## 2021-11-09 ENCOUNTER — Encounter: Payer: Self-pay | Admitting: Dermatology

## 2021-11-09 NOTE — Progress Notes (Signed)
? ? ?  Follow-Up Visit ?  ?Subjective  ?Richard [name] is a 76 y.o. male who presents for the following: Skin Problem (Dry patches on both knees- tx- clobex in past but nothing currently ). ? ?New patient with scaly spots on legs ?Location:  ?Duration:  ?Quality:  ?Associated Signs/Symptoms: ?Modifying Factors:  ?Severity:  ?Timing: ?Context:  ? ?Objective  ?Well appearing patient in no apparent distress; mood and affect are within normal limits. ?Waist up exam: No atypical pigmented lesions (all checked with dermoscopy).  1 possible superficial carcinoma arm will be biopsied ? ?Left Hallux Proximal Dorsal Toe, Left Knee - Anterior, Right Hallux Proximal Dorsal Toe, Right Knee - Anterior ?Small plaques of psoriasis.  Essentially all treatment options reviewed along with the benefits of maintaining the healthy as possible lifestyle choices. ? ? ?Left Upper Arm - Posterior ?Waxy 1cm psoriasiform scale adjacent to a scar ? ? ? ? ? ? ? ? ?A full examination was performed including scalp, head, eyes, ears, nose, lips, neck, chest, axillae, abdomen, back, buttocks, bilateral upper extremities, bilateral lower extremities, hands, feet, fingers, toes, fingernails, and toenails. All findings within normal limits unless otherwise noted below.  Beneath undergarment not fully examined. ? ? ?Assessment & Plan  ? ? ?Encounter for screening for malignant neoplasm of skin ? ?Annual skin examination ? ?Psoriasis vulgaris ?Left Knee - Anterior; Right Knee - Anterior; Left Hallux Proximal Dorsal Toe; Right Hallux Proximal Dorsal Toe ? ?Can use generic clobetasol for non-facial areas after bathing for 2 to 4 weeks.  Taper if improves.  Follow-up by MyChart in 1 month. ? ?Clobetasol Propionate (TEMOVATE) 0.05 % external spray - Left Hallux Proximal Dorsal Toe, Left Knee - Anterior, Right Hallux Proximal Dorsal Toe, Right Knee - Anterior ?Apply topically 2 (two) times daily. ? ?Neoplasm of uncertain behavior of skin ?Left Upper Arm -  Posterior ? ?Skin / nail biopsy ?Type of biopsy: tangential   ?Informed consent: discussed and consent obtained   ?Timeout: patient name, date of birth, surgical site, and procedure verified   ?Anesthesia: the lesion was anesthetized in a standard fashion   ?Anesthetic:  1% lidocaine w/ epinephrine 1-100,000 local infiltration ?Instrument used: flexible razor blade   ?Hemostasis achieved with: ferric subsulfate and electrodesiccation   ?Outcome: patient tolerated procedure well   ?Post-procedure details: wound care instructions given   ? ?Specimen 1 - Surgical pathology ?Differential Diagnosis: bcc vs scc ? ?Check Margins: No ? ? ? ? ? ?I, Lavonna Monarch, MD, have reviewed all documentation for this visit.  The documentation on 11/09/21 for the exam, diagnosis, procedures, and orders are all accurate and complete. ? ? ? ?I, Lavonna Monarch, MD, have reviewed all documentation for this visit.  The documentation on 11/09/21 for the exam, diagnosis, procedures, and orders are all accurate and complete. ?

## 2021-11-09 NOTE — ED Notes (Signed)
Pt verbalizes understanding of discharge instructions. Opportunity for questioning and answers were provided. Pt discharged from ED to home with family.    

## 2021-11-10 ENCOUNTER — Other Ambulatory Visit: Payer: Self-pay

## 2021-11-10 ENCOUNTER — Ambulatory Visit: Payer: Medicare Other | Admitting: Podiatry

## 2021-11-10 DIAGNOSIS — Z7901 Long term (current) use of anticoagulants: Secondary | ICD-10-CM

## 2021-11-10 DIAGNOSIS — Z91040 Latex allergy status: Secondary | ICD-10-CM | POA: Insufficient documentation

## 2021-11-10 DIAGNOSIS — M7661 Achilles tendinitis, right leg: Secondary | ICD-10-CM

## 2021-11-10 DIAGNOSIS — M79674 Pain in right toe(s): Secondary | ICD-10-CM | POA: Diagnosis not present

## 2021-11-10 DIAGNOSIS — B351 Tinea unguium: Secondary | ICD-10-CM

## 2021-11-10 DIAGNOSIS — W2209XA Striking against other stationary object, initial encounter: Secondary | ICD-10-CM | POA: Diagnosis not present

## 2021-11-10 DIAGNOSIS — M79675 Pain in left toe(s): Secondary | ICD-10-CM | POA: Diagnosis not present

## 2021-11-10 DIAGNOSIS — S0990XA Unspecified injury of head, initial encounter: Secondary | ICD-10-CM | POA: Diagnosis not present

## 2021-11-10 DIAGNOSIS — Y93E1 Activity, personal bathing and showering: Secondary | ICD-10-CM | POA: Insufficient documentation

## 2021-11-10 DIAGNOSIS — Z79899 Other long term (current) drug therapy: Secondary | ICD-10-CM | POA: Insufficient documentation

## 2021-11-10 NOTE — Patient Instructions (Signed)
Achilles Tendinitis  ?with Rehab ?Achilles tendinitis is a disorder of the Achilles tendon. The Achilles tendon connects the large calf muscles (Gastrocnemius and Soleus) to the heel bone (calcaneus). This tendon is sometimes called the heel cord. It is important for pushing-off and standing on your toes and is important for walking, running, or jumping. Tendinitis is often caused by overuse and repetitive microtrauma. ?SYMPTOMS ?Pain, tenderness, swelling, warmth, and redness may occur over the Achilles tendon even at rest. ?Pain with pushing off, or flexing or extending the ankle. ?Pain that is worsened after or during activity. ?CAUSES  ?Overuse sometimes seen with rapid increase in exercise programs or in sports requiring running and jumping. ?Poor physical conditioning (strength and flexibility or endurance). ?Running sports, especially training running down hills. ?Inadequate warm-up before practice or play or failure to stretch before participation. ?Injury to the tendon. ?PREVENTION  ?Warm up and stretch before practice or competition. ?Allow time for adequate rest and recovery between practices and competition. ?Keep up conditioning. ?Keep up ankle and leg flexibility. ?Improve or keep muscle strength and endurance. ?Improve cardiovascular fitness. ?Use proper technique. ?Use proper equipment (shoes, skates). ?To help prevent recurrence, taping, protective strapping, or an adhesive bandage may be recommended for several weeks after healing is complete. ?PROGNOSIS  ?Recovery may take weeks to several months to heal. ?Longer recovery is expected if symptoms have been prolonged. ?Recovery is usually quicker if the inflammation is due to a direct blow as compared with overuse or sudden strain. ?RELATED COMPLICATIONS  ?Healing time will be prolonged if the condition is not correctly treated. The injury must be given plenty of time to heal. ?Symptoms can reoccur if activity is resumed too soon. ?Untreated,  tendinitis may increase the risk of tendon rupture requiring additional time for recovery and possibly surgery. ?TREATMENT  ?The first treatment consists of rest anti-inflammatory medication, and ice to relieve the pain. ?Stretching and strengthening exercises after resolution of pain will likely help reduce the risk of recurrence. Referral to a physical therapist or athletic trainer for further evaluation and treatment may be helpful. ?A walking boot or cast may be recommended to rest the Achilles tendon. This can help break the cycle of inflammation and microtrauma. ?Arch supports (orthotics) may be prescribed or recommended by your caregiver as an adjunct to therapy and rest. ?Surgery to remove the inflamed tendon lining or degenerated tendon tissue is rarely necessary and has shown less than predictable results. ?MEDICATION  ?Nonsteroidal anti-inflammatory medications, such as aspirin and ibuprofen, may be used for pain and inflammation relief. Do not take within 7 days before surgery. Take these as directed by your caregiver. Contact your caregiver immediately if any bleeding, stomach upset, or signs of allergic reaction occur. Other minor pain relievers, such as acetaminophen, may also be used. ?Pain relievers may be prescribed as necessary by your caregiver. Do not take prescription pain medication for longer than 4 to 7 days. Use only as directed and only as much as you need. ?Cortisone injections are rarely indicated. Cortisone injections may weaken tendons and predispose to rupture. It is better to give the condition more time to heal than to use them. ?HEAT AND COLD ?Cold is used to relieve pain and reduce inflammation for acute and chronic Achilles tendinitis. Cold should be applied for 10 to 15 minutes every 2 to 3 hours for inflammation and pain and immediately after any activity that aggravates your symptoms. Use ice packs or an ice massage. ?Heat may be used before performing stretching  and  strengthening activities prescribed by your caregiver. Use a heat pack or a warm soak. ?SEEK MEDICAL CARE IF: ?Symptoms get worse or do not improve in 2 weeks despite treatment. ?New, unexplained symptoms develop. Drugs used in treatment may produce side effects. ? ?EXERCISES: ? ?RANGE OF MOTION (ROM) AND STRETCHING EXERCISES - Achilles Tendinitis  ?These exercises may help you when beginning to rehabilitate your injury. Your symptoms may resolve with or without further involvement from your physician, physical therapist or athletic trainer. While completing these exercises, remember:  ?Restoring tissue flexibility helps normal motion to return to the joints. This allows healthier, less painful movement and activity. ?An effective stretch should be held for at least 30 seconds. ?A stretch should never be painful. You should only feel a gentle lengthening or release in the stretched tissue. ? ?STRETCH  Gastroc, Standing  ?Place hands on wall. ?Extend right / left leg, keeping the front knee somewhat bent. ?Slightly point your toes inward on your back foot. ?Keeping your right / left heel on the floor and your knee straight, shift your weight toward the wall, not allowing your back to arch. ?You should feel a gentle stretch in the right / left calf. Hold this position for 10 seconds. ?Repeat 3 times. Complete this stretch 2 times per day. ? ?STRETCH  Soleus, Standing  ?Place hands on wall. ?Extend right / left leg, keeping the other knee somewhat bent. ?Slightly point your toes inward on your back foot. ?Keep your right / left heel on the floor, bend your back knee, and slightly shift your weight over the back leg so that you feel a gentle stretch deep in your back calf. ?Hold this position for 10 seconds. ?Repeat 3 times. Complete this stretch 2 times per day. ? ?Google, Standing  ?Note: This exercise can place a lot of stress on your foot and ankle. Please complete this exercise only if specifically  instructed by your caregiver.  ?Place the ball of your right / left foot on a step, keeping your other foot firmly on the same step. ?Hold on to the wall or a rail for balance. ?Slowly lift your other foot, allowing your body weight to press your heel down over the edge of the step. ?You should feel a stretch in your right / left calf. ?Hold this position for 10 seconds. ?Repeat this exercise with a slight bend in your knee. ?Repeat 3 times. Complete this stretch 2 times per day.  ? ?STRENGTHENING EXERCISES - Achilles Tendinitis ?These exercises may help you when beginning to rehabilitate your injury. They may resolve your symptoms with or without further involvement from your physician, physical therapist or athletic trainer. While completing these exercises, remember:  ?Muscles can gain both the endurance and the strength needed for everyday activities through controlled exercises. ?Complete these exercises as instructed by your physician, physical therapist or athletic trainer. Progress the resistance and repetitions only as guided. ?You may experience muscle soreness or fatigue, but the pain or discomfort you are trying to eliminate should never worsen during these exercises. If this pain does worsen, stop and make certain you are following the directions exactly. If the pain is still present after adjustments, discontinue the exercise until you can discuss the trouble with your clinician. ? ?STRENGTH - Plantar-flexors  ?Sit with your right / left leg extended. Holding onto both ends of a rubber exercise band/tubing, loop it around the ball of your foot. Keep a slight tension in the band. ?Slowly  push your toes away from you, pointing them downward. ?Hold this position for 10 seconds. Return slowly, controlling the tension in the band/tubing. ?Repeat 3 times. Complete this exercise 2 times per day.  ? ?STRENGTH - Plantar-flexors  ?Stand with your feet shoulder width apart. Steady yourself with a wall or table  using as little support as needed. ?Keeping your weight evenly spread over the width of your feet, rise up on your toes.* ?Hold this position for 10 seconds. ?Repeat 3 times. Complete this exercise 2 times per day.  ?

## 2021-11-11 ENCOUNTER — Encounter (HOSPITAL_BASED_OUTPATIENT_CLINIC_OR_DEPARTMENT_OTHER): Payer: Self-pay | Admitting: Emergency Medicine

## 2021-11-11 ENCOUNTER — Emergency Department (HOSPITAL_BASED_OUTPATIENT_CLINIC_OR_DEPARTMENT_OTHER)
Admission: EM | Admit: 2021-11-11 | Discharge: 2021-11-11 | Disposition: A | Discharge status: Home / Self Care | Payer: Medicare Other | Attending: Emergency Medicine | Admitting: Emergency Medicine

## 2021-11-11 ENCOUNTER — Other Ambulatory Visit: Payer: Self-pay

## 2021-11-11 ENCOUNTER — Emergency Department (HOSPITAL_BASED_OUTPATIENT_CLINIC_OR_DEPARTMENT_OTHER): Payer: Medicare Other

## 2021-11-11 DIAGNOSIS — S0990XA Unspecified injury of head, initial encounter: Secondary | ICD-10-CM | POA: Diagnosis not present

## 2021-11-11 NOTE — ED Triage Notes (Signed)
Pt states that he bumped his head on a plastic shelf in his shower. Denies LOC, no abrasion seen. Takes eliquis  ?

## 2021-11-11 NOTE — ED Notes (Signed)
Pt verbalizes understanding of discharge instructions. Opportunity for questioning and answers were provided. Pt discharged from ED to home with wife.    

## 2021-11-11 NOTE — ED Provider Notes (Signed)
? ?[facility]  ?Provider Note ? ?CSN: [id number] ?Arrival date & time: 11/10/21 2355 ? ?History ?Chief Complaint  ?Patient presents with  ? Head Injury  ? ? ?Richard [name] is a 76 y.o. male seen in the ED 2 days ago after two minor head injuries on the advice of his PCP due to Eliquis use. CT then was negative. Today he was leaning forward in the shower and bumped his head on a plastic shelf. No LOC, no fall, no marks on forehead. He developed a mild headache which was resolved with APAP but again called his PCP and was advised to come to the ED. He is currently asymptomatic.  ? ? ?Home Medications ?Prior to Admission medications   ?Medication Sig Start Date End Date Taking? Authorizing Provider  ?apixaban (ELIQUIS) 5 MG TABS tablet TAKE 1 TABLET(5 MG) BY MOUTH TWICE DAILY 10/23/21   Lelon Perla, MD  ?calcium carbonate (OS-CAL - DOSED IN MG OF ELEMENTAL CALCIUM) 1250 (500 Ca) MG tablet 1 tablet with meals    [provider]  ?CALCIUM-MAGNESIUM-ZINC PO Take 1 tablet by mouth 2 (two) times daily.     [provider]  ?cholecalciferol (VITAMIN D) 1000 UNITS tablet Take 1,000 Units by mouth daily.     [provider]  ?Clobetasol Propionate (TEMOVATE) 0.05 % external spray Apply topically 2 (two) times daily. 10/27/21   Lavonna Monarch, MD  ?[facility] Liver Oil CAPS     [provider]  ?cyclobenzaprine (FLEXERIL) 10 MG tablet Take 10 mg by mouth 3 (three) times daily as needed for muscle spasms (BACK).     [provider]  ?cyclobenzaprine (FLEXERIL) 10 MG tablet Take 1 tablet by mouth 2 (two) times daily. 02/04/21   [provider]  ?HYDROcodone-acetaminophen (NORCO/VICODIN) 5-325 MG tablet Take 1 tablet by mouth daily as needed. 02/29/20   [provider]  ?hydroxychloroquine (PLAQUENIL) 200 MG tablet Take 200 mg by mouth 2 (two) times daily.    [provider]  ?lisinopril-hydrochlorothiazide (PRINZIDE,ZESTORETIC) 10-12.5  MG per tablet Take 1 tablet by mouth every morning.    [provider]  ?Lutein-Zeaxanthin 25-5 MG CAPS Take by mouth.    [provider]  ?misoprostol (CYTOTEC) 100 MCG tablet Take 100 mcg by mouth 2 (two) times daily. Pt taking as needed.    [provider]  ?Multiple Vitamin (MULTIVITAMIN WITH MINERALS) TABS tablet Take 1 tablet by mouth daily.    [provider]  ?omeprazole (PRILOSEC) 40 MG capsule TK ONE C PO  ONCE A DAY 10/28/18   [provider]  ?pravastatin (PRAVACHOL) 20 MG tablet Take 20 mg by mouth daily.    [provider]  ?triamcinolone cream (KENALOG) 0.1 % Apply 1 application topically daily. Apply to psoriasis rash ?Patient taking differently: Apply 1 application. topically daily. Apply to psoriasis rash. Pt takes as needed. 06/20/19   Trula Slade, DPM  ?vitamin B-12 (CYANOCOBALAMIN) 1000 MCG tablet Take 1,000 mcg by mouth daily.    [provider]  ? ? ? ?Allergies    ?Azithromycin, Latex, and Prednisone ? ? ?Review of Systems   ?Review of Systems ?Please see HPI for pertinent positives and negatives ? ?Physical Exam ?BP 91/68   Pulse (!) 51   Temp 98 ?F (36.7 ?C)   Resp 18   Ht '5\' 9"'$  (1.753 m)   Wt 72.6 kg   SpO2 99%   BMI 23.64 kg/m?  ? ?Physical Exam ?Vitals and nursing note  reviewed.  ?Constitutional:   ?   Appearance: Normal appearance.  ?HENT:  ?   Head: Normocephalic and atraumatic.  ?   Nose: Nose normal.  ?   Mouth/Throat:  ?   Mouth: Mucous membranes are moist.  ?Eyes:  ?   Extraocular Movements: Extraocular movements intact.  ?   Conjunctiva/sclera: Conjunctivae normal.  ?Cardiovascular:  ?   Rate and Rhythm: Normal rate.  ?Pulmonary:  ?   Effort: Pulmonary effort is normal.  ?   Breath sounds: Normal breath sounds.  ?Abdominal:  ?   General: Abdomen is flat.  ?   Palpations: Abdomen is soft.  ?   Tenderness: There is no abdominal tenderness.  ?Musculoskeletal:     ?   General: No swelling. Normal range of  motion.  ?   Cervical back: Neck supple.  ?Skin: ?   General: Skin is warm and dry.  ?Neurological:  ?   General: No focal deficit present.  ?   Mental Status: He is alert.  ?Psychiatric:     ?   Mood and Affect: Mood normal.  ? ? ?ED Results / Procedures / Treatments   ?EKG ?None ? ?Procedures ?Procedures ? ?Medications Ordered in the ED ?Medications - No data to display ? ?Initial Impression and Plan ? Discussed with patient the low likelihood of any clinically significant CT findings given the low impact of his injury and lack of any signs of external trauma, however he is sufficiently worried that he would like to proceed with re-scan of head tonight.  ? ?ED Course  ? ?Clinical Course as of 11/11/21 0135  ?Tue Nov 11, 2021  ?0134 I personally viewed the images from radiology studies and agree with radiologist interpretation: CT is neg. Patient again given head injury precautions. PCP follow up.  ? [CS]  ?  ?Clinical Course User Index ?[CS] Truddie Hidden, MD  ? ? ? ?MDM Rules/Calculators/A&P ?Medical Decision Making ?Problems Addressed: ?Minor head injury, initial encounter: acute illness or injury ? ?Amount and/or Complexity of Data Reviewed ?Radiology: ordered and independent interpretation performed. Decision-making details documented in ED Course. ? ? ? ?Final Clinical Impression(s) / ED Diagnoses ?Final diagnoses:  ?Minor head injury, initial encounter  ? ? ?Rx / DC Orders ?ED Discharge Orders   ? ? None  ? ?  ? ?  ?Truddie Hidden, MD ?11/11/21 [id number] ? ?

## 2021-11-12 DIAGNOSIS — L4059 Other psoriatic arthropathy: Secondary | ICD-10-CM | POA: Diagnosis not present

## 2021-11-12 DIAGNOSIS — L409 Psoriasis, unspecified: Secondary | ICD-10-CM | POA: Diagnosis not present

## 2021-11-12 DIAGNOSIS — Z79899 Other long term (current) drug therapy: Secondary | ICD-10-CM | POA: Diagnosis not present

## 2021-11-12 DIAGNOSIS — Z6823 Body mass index (BMI) 23.0-23.9, adult: Secondary | ICD-10-CM | POA: Diagnosis not present

## 2021-11-12 DIAGNOSIS — M1991 Primary osteoarthritis, unspecified site: Secondary | ICD-10-CM | POA: Diagnosis not present

## 2021-11-12 NOTE — Progress Notes (Signed)
Subjective: ?76 y.o. returns the office today for painful, elongated, thickened toenails which he cannot trim himself. Denies any redness or drainage around the nails.  No open lesions.  At the end of the appointment today he states he also had other concerns of pain along the right Achilles tendon that he points to.  No injury that he reports.  No swelling.  No recent treatment. ? ?PCP: Merrilee Seashore, MD ? ?He is on Eliquis for A-fib ? ?Objective: ?AAO ?3, NAD ?DP/PT pulses palpable, CRT less than 3 seconds ?Nails hypertrophic, dystrophic, elongated, brittle, discolored ?10. There is tenderness overlying the nails 1-5 bilaterally. There is no surrounding erythema or drainage along the nail sites.  ?No open lesions or pre-ulcerative lesions are identified. ?There is modest [place] in the mid substance of the Achilles tendon on the right side.  No deficit noted in the Achilles tendon appears to be intact clinically.  No edema.  There is no pain with lateral compression of the calcaneus.  There is no other area discomfort.  MMT 5/5. ?No pain with calf compression, swelling, warmth, erythema. ? ?Assessment: ?Patient presents with symptomatic onychomycosis, on Eliquis; right Achilles tendinitis ? ?Plan: ?-Treatment options including alternatives, risks, complications were discussed ?-Nails sharply debrided ?10 without complication/bleeding. ?-Regards to the right side we discussed stretching, icing on a regular basis as well as shoes and good arch support.  Discussed heel lift as well.  If symptoms continue consider physical therapy.  Obtain x-rays if symptoms continue. ?-Discussed daily foot inspection. If there are any changes, to call the office immediately.  ?-Follow-up in 3 months or sooner if any problems are to arise. In the meantime, encouraged to call the office with any questions, concerns, changes symptoms. ? ?Celesta Gentile, DPM ? ?

## 2021-12-10 DIAGNOSIS — M170 Bilateral primary osteoarthritis of knee: Secondary | ICD-10-CM | POA: Diagnosis not present

## 2021-12-22 DIAGNOSIS — M549 Dorsalgia, unspecified: Secondary | ICD-10-CM | POA: Diagnosis not present

## 2021-12-30 ENCOUNTER — Emergency Department (HOSPITAL_BASED_OUTPATIENT_CLINIC_OR_DEPARTMENT_OTHER): Payer: Medicare Other

## 2021-12-30 ENCOUNTER — Telehealth: Payer: Self-pay | Admitting: Cardiology

## 2021-12-30 ENCOUNTER — Encounter (HOSPITAL_BASED_OUTPATIENT_CLINIC_OR_DEPARTMENT_OTHER): Payer: Self-pay | Admitting: *Deleted

## 2021-12-30 ENCOUNTER — Other Ambulatory Visit: Payer: Self-pay

## 2021-12-30 ENCOUNTER — Emergency Department (HOSPITAL_BASED_OUTPATIENT_CLINIC_OR_DEPARTMENT_OTHER)
Admission: EM | Admit: 2021-12-30 | Discharge: 2021-12-30 | Disposition: A | Discharge status: Home / Self Care | Payer: Medicare Other | Attending: Emergency Medicine | Admitting: Emergency Medicine

## 2021-12-30 DIAGNOSIS — Z91040 Latex allergy status: Secondary | ICD-10-CM | POA: Diagnosis not present

## 2021-12-30 DIAGNOSIS — S0990XA Unspecified injury of head, initial encounter: Secondary | ICD-10-CM

## 2021-12-30 DIAGNOSIS — R519 Headache, unspecified: Secondary | ICD-10-CM | POA: Insufficient documentation

## 2021-12-30 DIAGNOSIS — Z7901 Long term (current) use of anticoagulants: Secondary | ICD-10-CM | POA: Diagnosis not present

## 2021-12-30 MED ORDER — ACETAMINOPHEN 500 MG PO TABS
1000.0000 mg | ORAL_TABLET | Freq: Once | ORAL | Status: AC
  Administered 2021-12-30: 1000 mg via ORAL
  Filled 2021-12-30: qty 2

## 2021-12-30 NOTE — Telephone Encounter (Signed)
   Pre-operative Risk Assessment    Patient Name: Richard [name]  DOB: October 07, 1945 MRN: [id number]     Request for Surgical Clearance    Procedure:   Crown  Date of Surgery:  Clearance 12/31/21                                 Surgeon:  Dr. Regino Schultze Surgeon's Group or Practice Name:  [facility] Phone number:  [phone] Fax number:  [phone]   Type of Clearance Requested:   - Pharmacy:  Hold Apixaban (Eliquis) - When to stop   Type of Anesthesia:  None    Additional requests/questions:  Please advise surgeon/provider what medications should be held.  Signed, Belisicia T Harris   12/30/2021, 2:53 PM

## 2022-01-12 ENCOUNTER — Ambulatory Visit: Payer: Medicare Other | Admitting: Podiatry

## 2022-01-12 DIAGNOSIS — M79674 Pain in right toe(s): Secondary | ICD-10-CM | POA: Diagnosis not present

## 2022-01-12 DIAGNOSIS — M79675 Pain in left toe(s): Secondary | ICD-10-CM

## 2022-01-12 DIAGNOSIS — B351 Tinea unguium: Secondary | ICD-10-CM | POA: Diagnosis not present

## 2022-01-12 DIAGNOSIS — Z7901 Long term (current) use of anticoagulants: Secondary | ICD-10-CM | POA: Diagnosis not present

## 2022-01-13 NOTE — Progress Notes (Signed)
Subjective: 76 y.o. returns the office today for painful, elongated, thickened toenails which he cannot trim himself. Denies any redness or drainage around the nails.  No open lesions.  No longer have any pain to the heel.  No recent drainage otherwise.  PCP: Merrilee Seashore, MD  He is on Eliquis for A-fib  Objective: AAO 3, NAD DP/PT pulses palpable, CRT less than 3 seconds Nails hypertrophic, dystrophic, elongated, brittle, discolored 10. There is tenderness overlying the nails 1-5 bilaterally. There is no surrounding erythema or drainage along the nail sites.  No open lesions or pre-ulcerative lesions are identified. There is no tenderness palpation on the Achilles tendon of the heel.  No area pinpoint tenderness.  No edema.  MMT 5/5. No pain with calf compression, swelling, warmth, erythema.  Assessment: Patient presents with symptomatic onychomycosis, on Eliquis  Plan: -Treatment options including alternatives, risks, complications were discussed -Nails sharply debrided 10 without complication/bleeding. -Discussed daily foot inspection. If there are any changes, to call the office immediately.  -Follow-up in 3 months or sooner if any problems are to arise. In the meantime, encouraged to call the office with any questions, concerns, changes symptoms.  Celesta Gentile, DPM

## 2022-02-23 DIAGNOSIS — M7540 Impingement syndrome of unspecified shoulder: Secondary | ICD-10-CM | POA: Insufficient documentation

## 2022-02-23 DIAGNOSIS — M19011 Primary osteoarthritis, right shoulder: Secondary | ICD-10-CM | POA: Diagnosis not present

## 2022-02-23 DIAGNOSIS — M19012 Primary osteoarthritis, left shoulder: Secondary | ICD-10-CM | POA: Insufficient documentation

## 2022-02-23 DIAGNOSIS — M25512 Pain in left shoulder: Secondary | ICD-10-CM | POA: Diagnosis not present

## 2022-02-27 ENCOUNTER — Ambulatory Visit (INDEPENDENT_AMBULATORY_CARE_PROVIDER_SITE_OTHER): Payer: Medicare Other | Admitting: Internal Medicine

## 2022-02-27 ENCOUNTER — Encounter: Payer: Self-pay | Admitting: Internal Medicine

## 2022-02-27 VITALS — BP 124/82 | HR 83 | Temp 98.1°F | Ht 68.25 in | Wt 155.4 lb

## 2022-02-27 DIAGNOSIS — L4050 Arthropathic psoriasis, unspecified: Secondary | ICD-10-CM

## 2022-02-27 DIAGNOSIS — I480 Paroxysmal atrial fibrillation: Secondary | ICD-10-CM

## 2022-02-27 DIAGNOSIS — I10 Essential (primary) hypertension: Secondary | ICD-10-CM

## 2022-02-27 DIAGNOSIS — E782 Mixed hyperlipidemia: Secondary | ICD-10-CM | POA: Diagnosis not present

## 2022-02-27 LAB — TSH: TSH: 0.9 u[IU]/mL (ref 0.35–5.50)

## 2022-02-27 LAB — CBC
HCT: 40.5 % (ref 39.0–52.0)
Hemoglobin: 13.4 g/dL (ref 13.0–17.0)
MCHC: 33.1 g/dL (ref 30.0–36.0)
MCV: 85.3 fl (ref 78.0–100.0)
Platelets: 224 10*3/uL (ref 150.0–400.0)
RBC: 4.75 Mil/uL (ref 4.22–5.81)
RDW: 15.5 % (ref 11.5–15.5)
WBC: 6.5 10*3/uL (ref 4.0–10.5)

## 2022-02-27 LAB — COMPREHENSIVE METABOLIC PANEL
ALT: 42 U/L (ref 0–53)
AST: 27 U/L (ref 0–37)
Albumin: 4.3 g/dL (ref 3.5–5.2)
Alkaline Phosphatase: 73 U/L (ref 39–117)
BUN: 18 mg/dL (ref 6–23)
CO2: 31 mEq/L (ref 19–32)
Calcium: 10.2 mg/dL (ref 8.4–10.5)
Chloride: 103 mEq/L (ref 96–112)
Creatinine, Ser: 0.83 mg/dL (ref 0.40–1.50)
GFR: 86.56 mL/min (ref >60.00)
Glucose, Bld: 99 mg/dL (ref 70–99)
Potassium: 4.6 mEq/L (ref 3.5–5.1)
Sodium: 141 mEq/L (ref 135–145)
Total Bilirubin: 0.9 mg/dL (ref 0.2–1.2)
Total Protein: 7.4 g/dL (ref 6.0–8.3)

## 2022-02-27 LAB — LIPID PANEL
Cholesterol: 189 mg/dL (ref 0–200)
HDL: 71.7 mg/dL (ref >39.00)
LDL Cholesterol: 102 mg/dL — ABNORMAL HIGH (ref 0–99)
NonHDL: 116.98
Total CHOL/HDL Ratio: 3
Triglycerides: 73 mg/dL (ref 0.0–149.0)
VLDL: 14.6 mg/dL (ref 0.0–40.0)

## 2022-02-27 LAB — VITAMIN B12: Vitamin B-12: 802 pg/mL (ref 211–911)

## 2022-02-27 LAB — VITAMIN D 25 HYDROXY (VIT D DEFICIENCY, FRACTURES): VITD: 64.24 ng/mL (ref 30.00–100.00)

## 2022-02-27 NOTE — Assessment & Plan Note (Signed)
BP at goal on lisinopril/hctz 10/12.5 mg daily. Checking CMP and CBC and adjust as needed.

## 2022-02-27 NOTE — Assessment & Plan Note (Signed)
Checking lipid panel and adjust pravastatin 20 mg daily. Can refill if needed.

## 2022-02-27 NOTE — Assessment & Plan Note (Signed)
Taking eliquis for prevention of stroke and sounds regular today. Not on rate control presumably due to heart block

## 2022-02-27 NOTE — Patient Instructions (Addendum)
We will check the labs today.  Get the covid-19 shot this fall and a flu shot.  Check on the prevnar 20 for the extra pneumonia shot.

## 2022-02-27 NOTE — Progress Notes (Signed)
   Subjective:   Patient ID: Richard [name], male    DOB: June 25, 1946, 76 y.o.   MRN: [id number]  HPI The patient is a new 76 YO man coming in for ongoing care.   PMH, [facility], social history reviewed and updated  Review of Systems  Constitutional: Negative.   HENT: Negative.    Eyes: Negative.   Respiratory:  Negative for cough, chest tightness and shortness of breath.   Cardiovascular:  Negative for chest pain, palpitations and leg swelling.  Gastrointestinal:  Negative for abdominal distention, abdominal pain, constipation, diarrhea, nausea and vomiting.  Musculoskeletal:  Positive for arthralgias.  Skin: Negative.   Neurological: Negative.   Psychiatric/Behavioral: Negative.      Objective:  Physical Exam Constitutional:      Appearance: He is well-developed.  HENT:     Head: Normocephalic and atraumatic.  Cardiovascular:     Rate and Rhythm: Normal rate and regular rhythm.  Pulmonary:     Effort: Pulmonary effort is normal. No respiratory distress.     Breath sounds: Normal breath sounds. No wheezing or rales.  Abdominal:     General: Bowel sounds are normal. There is no distension.     Palpations: Abdomen is soft.     Tenderness: There is no abdominal tenderness. There is no rebound.  Musculoskeletal:     Cervical back: Normal range of motion.  Skin:    General: Skin is warm and dry.  Neurological:     Mental Status: He is alert and oriented to person, place, and time.     Coordination: Coordination normal.    Vitals:   02/27/22 1103  BP: 124/82  Pulse: 83  Temp: 98.1 F (36.7 C)  TempSrc: Oral  SpO2: 98%  Weight: 155 lb 6.4 oz (70.5 kg)  Height: 5' 8.25" (1.734 m)    Assessment & Plan:

## 2022-02-27 NOTE — Assessment & Plan Note (Signed)
Seeing rheumatology and taking plaquenil. Aware of and up to date on yearly eye exam. Checking CMP and CBC and TSH and vitamin B12 and vitamin d. Adjust as needed. Needs referral to new dermatologist as his is closed.

## 2022-03-04 ENCOUNTER — Encounter: Payer: Self-pay | Admitting: Internal Medicine

## 2022-03-04 ENCOUNTER — Ambulatory Visit: Payer: Medicare Other | Admitting: Internal Medicine

## 2022-03-04 ENCOUNTER — Ambulatory Visit (INDEPENDENT_AMBULATORY_CARE_PROVIDER_SITE_OTHER): Payer: Medicare Other | Admitting: Internal Medicine

## 2022-03-04 VITALS — BP 126/78 | HR 76 | Temp 97.8°F | Ht 68.25 in | Wt 149.0 lb

## 2022-03-04 DIAGNOSIS — E782 Mixed hyperlipidemia: Secondary | ICD-10-CM

## 2022-03-04 DIAGNOSIS — I10 Essential (primary) hypertension: Secondary | ICD-10-CM

## 2022-03-04 DIAGNOSIS — R7301 Impaired fasting glucose: Secondary | ICD-10-CM | POA: Diagnosis not present

## 2022-03-04 DIAGNOSIS — Z23 Encounter for immunization: Secondary | ICD-10-CM

## 2022-03-04 NOTE — Progress Notes (Signed)
   Subjective:   Patient ID: Richard [name], male    DOB: 12/20/1945, 76 y.o.   MRN: [id number]  HPI The patient is a 76 YO man coming in for follow up labs.  Review of Systems  Constitutional: Negative.   HENT: Negative.    Eyes: Negative.   Respiratory:  Negative for cough, chest tightness and shortness of breath.   Cardiovascular:  Negative for chest pain, palpitations and leg swelling.  Gastrointestinal:  Negative for abdominal distention, abdominal pain, constipation, diarrhea, nausea and vomiting.  Musculoskeletal: Negative.   Skin: Negative.   Neurological: Negative.   Psychiatric/Behavioral: Negative.      Objective:  Physical Exam Constitutional:      Appearance: He is well-developed.  HENT:     Head: Normocephalic and atraumatic.  Cardiovascular:     Rate and Rhythm: Normal rate and regular rhythm.  Pulmonary:     Effort: Pulmonary effort is normal. No respiratory distress.     Breath sounds: Normal breath sounds. No wheezing or rales.  Abdominal:     General: Bowel sounds are normal. There is no distension.     Palpations: Abdomen is soft.     Tenderness: There is no abdominal tenderness. There is no rebound.  Musculoskeletal:     Cervical back: Normal range of motion.  Skin:    General: Skin is warm and dry.  Neurological:     Mental Status: He is alert and oriented to person, place, and time.     Coordination: Coordination normal.     Vitals:   03/04/22 1015  BP: 126/78  Pulse: 76  Temp: 97.8 F (36.6 C)  TempSrc: Temporal  SpO2: 99%  Weight: 149 lb (67.6 kg)  Height: 5' 8.25" (1.734 m)   Visit time 15 minutes in face to face communication with patient and coordination of care, additional 5 minutes spent in record review, coordination or care, ordering tests, communicating/referring to other healthcare professionals, documenting in medical records all on the same day of the visit for total time 20 minutes spent on the visit.    Assessment & Plan:   Prevnar 20 given at visit

## 2022-03-04 NOTE — Patient Instructions (Signed)
We will do labs prior to your next apt

## 2022-03-06 ENCOUNTER — Encounter: Payer: Self-pay | Admitting: Internal Medicine

## 2022-03-06 DIAGNOSIS — Z23 Encounter for immunization: Secondary | ICD-10-CM

## 2022-03-06 DIAGNOSIS — R7301 Impaired fasting glucose: Secondary | ICD-10-CM | POA: Insufficient documentation

## 2022-03-06 NOTE — Addendum Note (Signed)
Addended by: Marijean Heath R on: 03/06/2022 12:36 PM   Modules accepted: Orders

## 2022-03-06 NOTE — Assessment & Plan Note (Addendum)
Reviewed recent labs and ordered CMP, U/A, microalbumin to creatinine ratio for prior to upcoming visit.

## 2022-03-06 NOTE — Assessment & Plan Note (Signed)
Discussed labs and ordered lipid panel for prior to upcoming visit.

## 2022-03-06 NOTE — Assessment & Plan Note (Signed)
Discussed results and ordered HgA1c prior to upcoming visit.

## 2022-03-10 ENCOUNTER — Encounter (HOSPITAL_BASED_OUTPATIENT_CLINIC_OR_DEPARTMENT_OTHER): Payer: Self-pay | Admitting: Obstetrics and Gynecology

## 2022-03-10 ENCOUNTER — Other Ambulatory Visit: Payer: Self-pay

## 2022-03-10 ENCOUNTER — Emergency Department (HOSPITAL_BASED_OUTPATIENT_CLINIC_OR_DEPARTMENT_OTHER)
Admission: EM | Admit: 2022-03-10 | Discharge: 2022-03-10 | Disposition: A | Discharge status: Home / Self Care | Payer: Medicare Other | Attending: Emergency Medicine | Admitting: Emergency Medicine

## 2022-03-10 ENCOUNTER — Emergency Department (HOSPITAL_BASED_OUTPATIENT_CLINIC_OR_DEPARTMENT_OTHER): Payer: Medicare Other

## 2022-03-10 DIAGNOSIS — I10 Essential (primary) hypertension: Secondary | ICD-10-CM | POA: Diagnosis not present

## 2022-03-10 DIAGNOSIS — Z7901 Long term (current) use of anticoagulants: Secondary | ICD-10-CM | POA: Insufficient documentation

## 2022-03-10 DIAGNOSIS — S060X0A Concussion without loss of consciousness, initial encounter: Secondary | ICD-10-CM | POA: Diagnosis not present

## 2022-03-10 DIAGNOSIS — Z79899 Other long term (current) drug therapy: Secondary | ICD-10-CM | POA: Diagnosis not present

## 2022-03-10 DIAGNOSIS — X58XXXA Exposure to other specified factors, initial encounter: Secondary | ICD-10-CM | POA: Diagnosis not present

## 2022-03-10 DIAGNOSIS — Z91040 Latex allergy status: Secondary | ICD-10-CM | POA: Insufficient documentation

## 2022-03-10 DIAGNOSIS — S0990XA Unspecified injury of head, initial encounter: Secondary | ICD-10-CM | POA: Diagnosis not present

## 2022-03-10 DIAGNOSIS — I4891 Unspecified atrial fibrillation: Secondary | ICD-10-CM | POA: Insufficient documentation

## 2022-03-10 NOTE — Discharge Instructions (Addendum)
You are safe to resume your Eliquis.  Your symptoms are consistent with likely concussion.  The CT imaging of your head was normal.

## 2022-03-10 NOTE — ED Notes (Signed)
Discharge instructions and follow up care reviewed and explained. Pt verbalized understanding and had no further questions on d/c.

## 2022-03-10 NOTE — ED Triage Notes (Signed)
Patient reports to the ER for head injury. Patient reports he had his head leaned into his birds cage and pulled his head up and hit his head hard on the metal cage. Patient reports he is on Eliquis

## 2022-03-10 NOTE — ED Provider Notes (Signed)
[facility] EMERGENCY DEPT Provider Note   CSN: [id number] Arrival date & time: 03/10/22  1653     History  Chief Complaint  Patient presents with   Head Injury    Richard [name] is a 76 y.o. male.   Head Injury    76 year old male with medical history significant for GERD, HTN, atrial fibrillation on Eliquis who presents emergency department after head trauma yesterday.  The patient states that he stood up and struck the back of his head on a birdcage.  He denies any loss of consciousness.  He has been compliant with his Eliquis.  He had some nausea earlier this morning and was endorsing a headache and so presented to the emerged department for further evaluation.  He arrived GCS 15, ABC intact.  He denies any other injuries or complaints.  He is up-to-date on his vaccines.  Home Medications Prior to Admission medications   Medication Sig Start Date End Date Taking? Authorizing Provider  apixaban (ELIQUIS) 5 MG TABS tablet TAKE 1 TABLET(5 MG) BY MOUTH TWICE DAILY 10/23/21   Lelon Perla, MD  CALCIUM-MAGNESIUM-ZINC PO Take 1 tablet by mouth 2 (two) times daily.     [provider]  cholecalciferol (VITAMIN D) 1000 UNITS tablet Take 1,000 Units by mouth daily.     [provider]  Clobetasol Propionate (TEMOVATE) 0.05 % external spray Apply topically 2 (two) times daily. 10/27/21   Lavonna Monarch, MD  [facility] Liver Oil CAPS     [provider]  cyclobenzaprine (FLEXERIL) 10 MG tablet Take 10 mg by mouth 3 (three) times daily as needed for muscle spasms (BACK).     [provider]  hydroxychloroquine (PLAQUENIL) 200 MG tablet Take 200 mg by mouth 2 (two) times daily.    [provider]  lisinopril-hydrochlorothiazide (PRINZIDE,ZESTORETIC) 10-12.5 MG per tablet Take 1 tablet by mouth every morning.    [provider]  Lutein-Zeaxanthin 25-5 MG CAPS Take by mouth.    [provider]  misoprostol (CYTOTEC) 100  MCG tablet Take 100 mcg by mouth 2 (two) times daily. Pt taking as needed.    [provider]  Multiple Vitamin (MULTIVITAMIN WITH MINERALS) TABS tablet Take 1 tablet by mouth daily.    [provider]  omeprazole (PRILOSEC) 40 MG capsule TK ONE C PO  ONCE A DAY 10/28/18   [provider]  pravastatin (PRAVACHOL) 20 MG tablet Take 20 mg by mouth daily.    [provider]  triamcinolone cream (KENALOG) 0.1 % Apply 1 application topically daily. Apply to psoriasis rash Patient taking differently: Apply 1 application  topically daily. Apply to psoriasis rash. Pt takes as needed. 06/20/19   Trula Slade, DPM  vitamin B-12 (CYANOCOBALAMIN) 1000 MCG tablet Take 1,000 mcg by mouth daily.    [provider]      Allergies    Azithromycin, Latex, and Prednisone    Review of Systems   Review of Systems  All other systems reviewed and are negative.   Physical Exam Updated Vital Signs BP 103/71 (BP Location: Left Arm)   Pulse 82   Temp 97.9 F (36.6 C)   Resp 16   Ht 5' 8.5" (1.74 m)   Wt 68.9 kg   SpO2 100%   BMI 22.78 kg/m  Physical Exam Vitals and nursing note reviewed.  Constitutional:      Appearance: He is well-developed.     Comments: GCS 15, ABC intact  HENT:  Head: Normocephalic.  Eyes:     Conjunctiva/sclera: Conjunctivae normal.  Neck:     Comments: No midline tenderness to palpation of the cervical spine. ROM intact. Cardiovascular:     Rate and Rhythm: Normal rate and regular rhythm.  Pulmonary:     Effort: Pulmonary effort is normal. No respiratory distress.     Breath sounds: Normal breath sounds.  Chest:     Comments: Chest wall stable and non-tender to AP and lateral compression. Clavicles stable and non-tender to AP compression Abdominal:     Palpations: Abdomen is soft.     Tenderness: There is no abdominal tenderness.     Comments: Pelvis stable to lateral compression.  Musculoskeletal:     Cervical back:  Neck supple.     Comments: No midline tenderness to palpation of the thoracic or lumbar spine. Extremities atraumatic with intact ROM.   Skin:    General: Skin is warm and dry.  Neurological:     Mental Status: He is alert.     Comments: CN II-XII grossly intact. Moving all four extremities spontaneously and sensation grossly intact.     ED Results / Procedures / Treatments   Labs (all labs ordered are listed, but only abnormal results are displayed) Labs Reviewed - No data to display  EKG None  Radiology CT Head Wo Contrast  Result Date: 03/10/2022 CLINICAL DATA:  Head trauma EXAM: CT HEAD WITHOUT CONTRAST TECHNIQUE: Contiguous axial images were obtained from the base of the skull through the vertex without intravenous contrast. RADIATION DOSE REDUCTION: This exam was performed according to the departmental dose-optimization program which includes automated exposure control, adjustment of the mA and/or kV according to patient size and/or use of iterative reconstruction technique. COMPARISON:  CT brain 12/30/2021 FINDINGS: Brain: No acute territorial infarction, hemorrhage or intracranial mass. The ventricles are nonenlarged. Vascular: No hyperdense vessel or unexpected calcification. Skull: Normal. Negative for fracture or focal lesion. Sinuses/Orbits: No acute finding. Other: None IMPRESSION: Negative non contrasted CT appearance of the brain. Electronically Signed   By: Donavan Foil M.D.   On: 03/10/2022 17:53    Procedures Procedures    Medications Ordered in ED Medications - No data to display  ED Course/ Medical Decision Making/ A&P                           Medical Decision Making Amount and/or Complexity of Data Reviewed Radiology: ordered.     76 year old male with medical history significant for GERD, HTN, atrial fibrillation on Eliquis who presents emergency department after head trauma yesterday.  The patient states that he stood up and struck the back of his head  on a birdcage.  He denies any loss of consciousness.  He has been compliant with his Eliquis.  He had some nausea earlier this morning and was endorsing a headache and so presented to the emerged department for further evaluation.  He arrived GCS 15, ABC intact.  He denies any other injuries or complaints.  He is up-to-date on his vaccines.  On arrival, the patient was vitally stable.  No significant neck pain on exam.  CT imaging of the head was performed which revealed no acute intracranial abnormality.  Patient symptoms are consistent with likely concussion in the setting of mild head trauma.  No other injuries identified on primary or secondary survey.  Overall stable at this time for discharge.   Final Clinical Impression(s) / ED Diagnoses Final diagnoses:  Traumatic  injury of head, initial encounter  Concussion without loss of consciousness, initial encounter    Rx / DC Orders ED Discharge Orders     None         Regan Lemming, MD 03/10/22 1952

## 2022-03-12 ENCOUNTER — Encounter (HOSPITAL_BASED_OUTPATIENT_CLINIC_OR_DEPARTMENT_OTHER): Payer: Self-pay

## 2022-03-12 ENCOUNTER — Other Ambulatory Visit: Payer: Self-pay

## 2022-03-12 ENCOUNTER — Emergency Department (HOSPITAL_BASED_OUTPATIENT_CLINIC_OR_DEPARTMENT_OTHER)
Admission: EM | Admit: 2022-03-12 | Discharge: 2022-03-12 | Disposition: A | Discharge status: Home / Self Care | Payer: Medicare Other | Attending: Emergency Medicine | Admitting: Emergency Medicine

## 2022-03-12 ENCOUNTER — Emergency Department (HOSPITAL_BASED_OUTPATIENT_CLINIC_OR_DEPARTMENT_OTHER): Payer: Medicare Other

## 2022-03-12 DIAGNOSIS — I4891 Unspecified atrial fibrillation: Secondary | ICD-10-CM | POA: Insufficient documentation

## 2022-03-12 DIAGNOSIS — I10 Essential (primary) hypertension: Secondary | ICD-10-CM | POA: Diagnosis not present

## 2022-03-12 DIAGNOSIS — S0990XA Unspecified injury of head, initial encounter: Secondary | ICD-10-CM | POA: Insufficient documentation

## 2022-03-12 DIAGNOSIS — Z7901 Long term (current) use of anticoagulants: Secondary | ICD-10-CM | POA: Diagnosis not present

## 2022-03-12 DIAGNOSIS — Z79899 Other long term (current) drug therapy: Secondary | ICD-10-CM | POA: Insufficient documentation

## 2022-03-12 DIAGNOSIS — W228XXA Striking against or struck by other objects, initial encounter: Secondary | ICD-10-CM | POA: Diagnosis not present

## 2022-03-12 DIAGNOSIS — Z91040 Latex allergy status: Secondary | ICD-10-CM | POA: Diagnosis not present

## 2022-03-12 NOTE — Discharge Instructions (Signed)
You can use tylenol as needed for pain. Continue to be cautious for any head injury while taking blood thinners.

## 2022-03-12 NOTE — ED Provider Notes (Signed)
[place] EMERGENCY DEPT Provider Note   CSN: [id number] Arrival date & time: 03/12/22  1806     History  Chief Complaint  Patient presents with   Head Injury    Richard [name] is a 76 y.o. male with past medical history significant for acid reflux, hypertension, arthritis, A-fib who is currently taking Eliquis who presents with concern for head injury.  Patient reports that he hit the top of his head on a large bird house, denies loss of consciousness.  He does report that he had a severe headache for few minutes afterwards which is since somewhat improved.  Denies any photophobia, vision changes, nausea, vomiting, dizziness, blurred vision, numbness, tingling of any of extremities.  Patient reports that he would not be worried about it other than the fact that he is on a blood thinner.   Head Injury      Home Medications Prior to Admission medications   Medication Sig Start Date End Date Taking? Authorizing Provider  apixaban (ELIQUIS) 5 MG TABS tablet TAKE 1 TABLET(5 MG) BY MOUTH TWICE DAILY 10/23/21   Lelon Perla, MD  CALCIUM-MAGNESIUM-ZINC PO Take 1 tablet by mouth 2 (two) times daily.     [provider]  cholecalciferol (VITAMIN D) 1000 UNITS tablet Take 1,000 Units by mouth daily.     [provider]  Clobetasol Propionate (TEMOVATE) 0.05 % external spray Apply topically 2 (two) times daily. 10/27/21   Lavonna Monarch, MD  [facility] Liver Oil CAPS     [provider]  cyclobenzaprine (FLEXERIL) 10 MG tablet Take 10 mg by mouth 3 (three) times daily as needed for muscle spasms (BACK).     [provider]  hydroxychloroquine (PLAQUENIL) 200 MG tablet Take 200 mg by mouth 2 (two) times daily.    [provider]  lisinopril-hydrochlorothiazide (PRINZIDE,ZESTORETIC) 10-12.5 MG per tablet Take 1 tablet by mouth every morning.    [provider]  Lutein-Zeaxanthin 25-5 MG CAPS Take by mouth.    [provider]  misoprostol (CYTOTEC) 100 MCG tablet Take 100 mcg by mouth 2 (two) times daily. Pt taking as needed.    [provider]  Multiple Vitamin (MULTIVITAMIN WITH MINERALS) TABS tablet Take 1 tablet by mouth daily.    [provider]  omeprazole (PRILOSEC) 40 MG capsule TK ONE C PO  ONCE A DAY 10/28/18   [provider]  pravastatin (PRAVACHOL) 20 MG tablet Take 20 mg by mouth daily.    [provider]  triamcinolone cream (KENALOG) 0.1 % Apply 1 application topically daily. Apply to psoriasis rash Patient taking differently: Apply 1 application  topically daily. Apply to psoriasis rash. Pt takes as needed. 06/20/19   Trula Slade, DPM  vitamin B-12 (CYANOCOBALAMIN) 1000 MCG tablet Take 1,000 mcg by mouth daily.    [provider]      Allergies    Azithromycin, Latex, and Prednisone    Review of Systems   Review of Systems  All other systems reviewed and are negative.   Physical Exam Updated Vital Signs BP 105/79   Pulse 76   Temp 97.8 F (36.6 C) (Oral)   Resp 18   Ht '5\' 9"'$  (1.753 m)   Wt 68.5 kg   SpO2 99%   BMI 22.30 kg/m  Physical Exam Vitals and nursing note reviewed.  Constitutional:      General: He is not in acute distress.    Appearance: Normal appearance.  HENT:  Head: Normocephalic and atraumatic.  Eyes:     General:        Right eye: No discharge.        Left eye: No discharge.  Cardiovascular:     Rate and Rhythm: Normal rate and regular rhythm.  Pulmonary:     Effort: Pulmonary effort is normal. No respiratory distress.  Musculoskeletal:        General: No deformity.  Skin:    General: Skin is warm and dry.     Comments: Minimal to no soft tissue changes, abrasion on the crown of the skull where patient had injury.  Neurological:     Mental Status: He is alert and oriented to person, place, and time.     Comments: Cranial nerves II through XII grossly intact.  Intact finger-nose, intact heel-to-shin.   Romberg negative, gait normal.  Alert and oriented x3.  Moves all 4 limbs spontaneously, normal coordination.  No pronator drift.  Intact strength 5 out of 5 bilateral upper and lower extremities.    Psychiatric:        Mood and Affect: Mood normal.        Behavior: Behavior normal.     ED Results / Procedures / Treatments   Labs (all labs ordered are listed, but only abnormal results are displayed) Labs Reviewed - No data to display  EKG None  Radiology CT Head Wo Contrast  Result Date: 03/12/2022 CLINICAL DATA:  Trauma to the head. EXAM: CT HEAD WITHOUT CONTRAST TECHNIQUE: Contiguous axial images were obtained from the base of the skull through the vertex without intravenous contrast. RADIATION DOSE REDUCTION: This exam was performed according to the departmental dose-optimization program which includes automated exposure control, adjustment of the mA and/or kV according to patient size and/or use of iterative reconstruction technique. COMPARISON:  Brain CT March 10, 2022 FINDINGS: Brain: No evidence of acute infarction, hemorrhage, hydrocephalus, extra-axial collection or mass lesion/mass effect. Vascular: No hyperdense vessel or unexpected calcification. Skull: Normal. Negative for fracture or focal lesion. Sinuses/Orbits: No acute finding. Other: None. IMPRESSION: No acute intracranial process. Electronically Signed   By: Lovey Newcomer M.D.   On: 03/12/2022 19:39    Procedures Procedures    Medications Ordered in ED Medications - No data to display  ED Course/ Medical Decision Making/ A&P                           Medical Decision Making Amount and/or Complexity of Data Reviewed Radiology: ordered.   This is a well-appearing 76 year old Male with a history of A-fib who is currently taking Eliquis who presents with concern for head injury on bird house.  Patient did not lose consciousness, has no other focal neurologic symptoms, he is neurologically intact, exam.  He is  without any photophobia, phonophobia, nausea, vomiting, dizziness, blurred vision, double vision, aphasia, confusion, slurred speech.  Patient reports pain is 2/10 at this time.  On physical exam patient without significant contusion on the top of his head.  I independently interpreted imaging including CT head without contrast which shows no acute intracranial normalities. I agree with the radiologist interpretation.  Patient still with very mild headache, no evidence of intracranial bleed or other sequelae from his head injury.  Symptoms are not consistent with a concussion on my exam.  Encouraged Tylenol as needed for pain, encourage PCP follow-up, encouraged to continue taking Eliquis.  Patient discharged in stable condition at this time.  Return precautions  given.  Final Clinical Impression(s) / ED Diagnoses Final diagnoses:  Head injury    Rx / DC Orders ED Discharge Orders     None         Richard [name] 03/12/22 2017    Fransico Meadow, MD 03/13/22 2016

## 2022-03-12 NOTE — ED Triage Notes (Signed)
States he hit the top of his head om a lg birdhouse.   -LOC and is on Eliquis

## 2022-03-12 NOTE — ED Notes (Signed)
Spoke with patient who is concerned about waiting for over 20 minutes. He stated that he fell at home and he is concerned because he takes Eliquis. He wants to get home and take his 8pm meds. Took his vitals and assured him that physician will let us know if he needs a cat scan once he sees him. He is sitting in the bed with his family by his bedside

## 2022-03-23 DIAGNOSIS — M25511 Pain in right shoulder: Secondary | ICD-10-CM | POA: Diagnosis not present

## 2022-03-23 DIAGNOSIS — M25512 Pain in left shoulder: Secondary | ICD-10-CM | POA: Diagnosis not present

## 2022-03-30 ENCOUNTER — Ambulatory Visit: Payer: Medicare Other | Admitting: Podiatry

## 2022-03-30 DIAGNOSIS — M79674 Pain in right toe(s): Secondary | ICD-10-CM | POA: Diagnosis not present

## 2022-03-30 DIAGNOSIS — Z7901 Long term (current) use of anticoagulants: Secondary | ICD-10-CM | POA: Diagnosis not present

## 2022-03-30 DIAGNOSIS — M79675 Pain in left toe(s): Secondary | ICD-10-CM

## 2022-03-30 DIAGNOSIS — B351 Tinea unguium: Secondary | ICD-10-CM

## 2022-03-30 DIAGNOSIS — M722 Plantar fascial fibromatosis: Secondary | ICD-10-CM

## 2022-03-30 NOTE — Progress Notes (Unsigned)
Subjective: 76 y.o. returns the office today for painful, elongated, thickened toenails which he cannot trim himself. Denies any redness or drainage around the nails.  No open lesions.  He has been in some pain to the heel again.  No recent injury or trauma.  No swelling.  No revisions.  No other concerns.    PCP: Merrilee Seashore, MD  He is on Eliquis for A-fib  Objective: AAO 3, NAD DP/PT pulses palpable, CRT less than 3 seconds Nails hypertrophic, dystrophic, elongated, brittle, discolored 10. There is tenderness overlying the nails 1-5 bilaterally. There is no surrounding erythema or drainage along the nail sites.  No open lesions or pre-ulcerative lesions are identified. Tenderness palpation along the plantar aspect calcaneal insertion of plantar fascia.  This is present bilaterally.  There is no pain with lateral course of calcaneus.  No pain in the Achilles tendon.  Clinically the flexor, extensor tendons appear to be intact.  No edema or erythema.  MMT 5/5.  No Tinel sign. No pain with calf compression, swelling, warmth, erythema.  Assessment: Patient presents with symptomatic onychomycosis, on Eliquis  Plan: -Treatment options including alternatives, risks, complications were discussed -Nails sharply debrided 10 without complication/bleeding. -Regard to the heel pain we discussed stretching, icing a regular basis as well as using the arch supports.  Discussed bracing clinic plantar fascial brace but he wants to hold off on this for now.  If no improvement next appointment we will reconsider this. -Discussed daily foot inspection. If there are any changes, to call the office immediately.  -Follow-up in 3 months or sooner if any problems are to arise. In the meantime, encouraged to call the office with any questions, concerns, changes symptoms.  *X-ray feet if still having heel pain.  *Patient was upset today as I was 30 minutes behind scheduled.  I apologized for the  inconvenience.  Celesta Gentile, DPM

## 2022-03-31 ENCOUNTER — Telehealth: Payer: Self-pay | Admitting: Internal Medicine

## 2022-04-03 ENCOUNTER — Encounter: Payer: Self-pay | Admitting: Internal Medicine

## 2022-04-03 ENCOUNTER — Telehealth: Payer: Self-pay

## 2022-04-03 DIAGNOSIS — I4891 Unspecified atrial fibrillation: Secondary | ICD-10-CM

## 2022-04-03 NOTE — Telephone Encounter (Signed)
Patient is calling stating [employer] is needing this. Patient would like a call back once it has been handled.

## 2022-04-06 DIAGNOSIS — M7540 Impingement syndrome of unspecified shoulder: Secondary | ICD-10-CM | POA: Diagnosis not present

## 2022-04-06 DIAGNOSIS — M19012 Primary osteoarthritis, left shoulder: Secondary | ICD-10-CM | POA: Diagnosis not present

## 2022-04-06 DIAGNOSIS — M19011 Primary osteoarthritis, right shoulder: Secondary | ICD-10-CM | POA: Diagnosis not present

## 2022-04-06 NOTE — Telephone Encounter (Signed)
Please advise 

## 2022-04-07 NOTE — Telephone Encounter (Signed)
Please advise 

## 2022-04-07 NOTE — Telephone Encounter (Signed)
Patient wants you to call him.

## 2022-04-08 MED ORDER — APIXABAN 5 MG PO TABS
5.0000 mg | ORAL_TABLET | Freq: Two times a day (BID) | ORAL | 3 refills | Status: DC
Start: 2022-04-08 — End: 2022-12-21

## 2022-04-08 MED ORDER — OMEPRAZOLE 40 MG PO CPDR: 40.0000 mg | DELAYED_RELEASE_CAPSULE | Freq: Every day | ORAL | 3 refills | Status: DC

## 2022-04-08 MED ORDER — PRAVASTATIN SODIUM 20 MG PO TABS: 20.0000 mg | ORAL_TABLET | Freq: Every day | ORAL | 3 refills | Status: DC

## 2022-04-08 MED ORDER — LISINOPRIL-HYDROCHLOROTHIAZIDE 10-12.5 MG PO TABS
1.0000 | ORAL_TABLET | Freq: Every morning | ORAL | 3 refills | Status: DC
Start: 2022-04-08 — End: 2022-12-04

## 2022-04-08 NOTE — Addendum Note (Signed)
Addended by: Pricilla Holm A on: 04/08/2022 07:34 AM   Modules accepted: Orders

## 2022-04-08 NOTE — Telephone Encounter (Signed)
Medication sent to the pharmacy.

## 2022-04-08 NOTE — Telephone Encounter (Signed)
I am unsure of why he would be taking misoprostol so cannot fill. Can we verify how often and dosing on flexeril (cyclobenzaprine so I can fill for him). Others sent in.For future if patient is requesting certain pharmacy please change pharmacy prior to routing so meds are not inadvertently sent to wrong pharmacy thanks.

## 2022-04-08 NOTE — Telephone Encounter (Signed)
LVM--sent medication to the pharmacy.

## 2022-04-10 DIAGNOSIS — M25512 Pain in left shoulder: Secondary | ICD-10-CM | POA: Diagnosis not present

## 2022-04-10 DIAGNOSIS — M25511 Pain in right shoulder: Secondary | ICD-10-CM | POA: Diagnosis not present

## 2022-04-15 DIAGNOSIS — H35373 Puckering of macula, bilateral: Secondary | ICD-10-CM | POA: Diagnosis not present

## 2022-04-15 DIAGNOSIS — H43813 Vitreous degeneration, bilateral: Secondary | ICD-10-CM | POA: Diagnosis not present

## 2022-04-15 DIAGNOSIS — H2513 Age-related nuclear cataract, bilateral: Secondary | ICD-10-CM | POA: Diagnosis not present

## 2022-04-17 DIAGNOSIS — M25512 Pain in left shoulder: Secondary | ICD-10-CM | POA: Diagnosis not present

## 2022-04-17 DIAGNOSIS — M25511 Pain in right shoulder: Secondary | ICD-10-CM | POA: Diagnosis not present

## 2022-04-21 ENCOUNTER — Encounter: Payer: Self-pay | Admitting: Internal Medicine

## 2022-04-22 NOTE — Telephone Encounter (Signed)
Patient is asking for Vicente Males to call her back.

## 2022-04-23 ENCOUNTER — Other Ambulatory Visit: Payer: Self-pay

## 2022-04-23 ENCOUNTER — Other Ambulatory Visit: Payer: Self-pay | Admitting: *Deleted

## 2022-04-23 MED ORDER — CYCLOBENZAPRINE HCL 10 MG PO TABS: 10.0000 mg | ORAL_TABLET | Freq: Three times a day (TID) | ORAL | 0 refills | Status: DC | PRN

## 2022-04-23 NOTE — Telephone Encounter (Signed)
Spoke to pt--called [facility] mail [facility] verified received the medication request and ready to ship soon to pt.

## 2022-04-23 NOTE — Telephone Encounter (Signed)
Patient states he is returning a call to [facility]. Dont see documentation.

## 2022-04-24 DIAGNOSIS — M25512 Pain in left shoulder: Secondary | ICD-10-CM | POA: Diagnosis not present

## 2022-04-24 DIAGNOSIS — M25511 Pain in right shoulder: Secondary | ICD-10-CM | POA: Diagnosis not present

## 2022-04-30 ENCOUNTER — Telehealth: Payer: Self-pay

## 2022-04-30 NOTE — Telephone Encounter (Signed)
Patient is calling in asking if we can adjust the script for CYCLOBENZAPRINE, he only takes it once daily and asked for [place] to re send it to [facility].

## 2022-04-30 NOTE — Telephone Encounter (Signed)
Pt requesting to change cyclobenzaprine 1 tab daily #90 day supply on file for the next refill.

## 2022-05-01 DIAGNOSIS — M25512 Pain in left shoulder: Secondary | ICD-10-CM | POA: Diagnosis not present

## 2022-05-01 DIAGNOSIS — M25511 Pain in right shoulder: Secondary | ICD-10-CM | POA: Diagnosis not present

## 2022-05-01 NOTE — Telephone Encounter (Signed)
I see multiple prior notes where I have requested the verification of how he is taking this medicine to prescribe. Can we verify dosing and how he is taking cyclobenzaprine.

## 2022-05-04 NOTE — Telephone Encounter (Signed)
LVM--to call the office back regarding verification.

## 2022-05-06 DIAGNOSIS — M25511 Pain in right shoulder: Secondary | ICD-10-CM | POA: Diagnosis not present

## 2022-05-06 DIAGNOSIS — M25512 Pain in left shoulder: Secondary | ICD-10-CM | POA: Diagnosis not present

## 2022-05-07 ENCOUNTER — Encounter: Payer: Self-pay | Admitting: Internal Medicine

## 2022-05-07 NOTE — Telephone Encounter (Signed)
Spoke to pt--verified cyclobenzaprine '10mg'$ --take 1 tablet BID-

## 2022-05-07 NOTE — Telephone Encounter (Signed)
Pt --verified RxCyclobenzaprine '10mg'$ , #90 --take 1 tablet BID -send to [facility] delivery. Pt stated still have couple medication left.

## 2022-05-08 MED ORDER — CYCLOBENZAPRINE HCL 10 MG PO TABS
10.0000 mg | ORAL_TABLET | Freq: Every day | ORAL | 1 refills | Status: DC | PRN
Start: 2022-05-08 — End: 2022-07-20

## 2022-05-08 NOTE — Telephone Encounter (Signed)
Sent in

## 2022-05-08 NOTE — Addendum Note (Signed)
Addended by: Pricilla Holm A on: 05/08/2022 11:32 AM   Modules accepted: Orders

## 2022-05-15 DIAGNOSIS — M25512 Pain in left shoulder: Secondary | ICD-10-CM | POA: Diagnosis not present

## 2022-05-15 DIAGNOSIS — M25511 Pain in right shoulder: Secondary | ICD-10-CM | POA: Diagnosis not present

## 2022-05-18 DIAGNOSIS — Z79899 Other long term (current) drug therapy: Secondary | ICD-10-CM | POA: Diagnosis not present

## 2022-05-18 DIAGNOSIS — M1991 Primary osteoarthritis, unspecified site: Secondary | ICD-10-CM | POA: Diagnosis not present

## 2022-05-18 DIAGNOSIS — L4059 Other psoriatic arthropathy: Secondary | ICD-10-CM | POA: Diagnosis not present

## 2022-05-18 DIAGNOSIS — Z6822 Body mass index (BMI) 22.0-22.9, adult: Secondary | ICD-10-CM | POA: Diagnosis not present

## 2022-05-18 DIAGNOSIS — L409 Psoriasis, unspecified: Secondary | ICD-10-CM | POA: Diagnosis not present

## 2022-05-22 DIAGNOSIS — M170 Bilateral primary osteoarthritis of knee: Secondary | ICD-10-CM | POA: Diagnosis not present

## 2022-05-25 DIAGNOSIS — M25512 Pain in left shoulder: Secondary | ICD-10-CM | POA: Diagnosis not present

## 2022-05-25 DIAGNOSIS — M25511 Pain in right shoulder: Secondary | ICD-10-CM | POA: Diagnosis not present

## 2022-06-03 DIAGNOSIS — M25511 Pain in right shoulder: Secondary | ICD-10-CM | POA: Diagnosis not present

## 2022-06-03 DIAGNOSIS — M25512 Pain in left shoulder: Secondary | ICD-10-CM | POA: Diagnosis not present

## 2022-06-04 ENCOUNTER — Telehealth: Payer: Self-pay

## 2022-06-04 ENCOUNTER — Ambulatory Visit: Payer: Medicare Other

## 2022-06-04 NOTE — Telephone Encounter (Signed)
Please schedule patient for AWV-S after 08/29/2022.  Patient's last AWV was completed by an outside provider on 08/29/2021.  Mignon Pine, LPN.

## 2022-06-05 ENCOUNTER — Ambulatory Visit: Payer: Medicare Other | Admitting: Podiatry

## 2022-06-10 DIAGNOSIS — M25512 Pain in left shoulder: Secondary | ICD-10-CM | POA: Diagnosis not present

## 2022-06-10 DIAGNOSIS — M25511 Pain in right shoulder: Secondary | ICD-10-CM | POA: Diagnosis not present

## 2022-06-19 DIAGNOSIS — M25511 Pain in right shoulder: Secondary | ICD-10-CM | POA: Diagnosis not present

## 2022-06-19 DIAGNOSIS — M25512 Pain in left shoulder: Secondary | ICD-10-CM | POA: Diagnosis not present

## 2022-06-22 ENCOUNTER — Ambulatory Visit: Payer: Medicare Other | Admitting: Podiatry

## 2022-06-22 VITALS — BP 100/62 | HR 86

## 2022-06-22 DIAGNOSIS — M79674 Pain in right toe(s): Secondary | ICD-10-CM | POA: Diagnosis not present

## 2022-06-22 DIAGNOSIS — B351 Tinea unguium: Secondary | ICD-10-CM

## 2022-06-22 DIAGNOSIS — M79675 Pain in left toe(s): Secondary | ICD-10-CM

## 2022-06-22 DIAGNOSIS — Z7901 Long term (current) use of anticoagulants: Secondary | ICD-10-CM | POA: Diagnosis not present

## 2022-06-24 DIAGNOSIS — M25512 Pain in left shoulder: Secondary | ICD-10-CM | POA: Diagnosis not present

## 2022-06-24 DIAGNOSIS — M25511 Pain in right shoulder: Secondary | ICD-10-CM | POA: Diagnosis not present

## 2022-06-24 NOTE — Progress Notes (Signed)
Subjective: Chief Complaint  Patient presents with   Nail Problem    Patient came in today for nail trim, callus, still having some pain in the right heel, rate of pain 2 out of 79    76 y.o. returns the office today for painful, elongated, thickened toenails which he cannot trim himself. Denies any redness or drainage around the nails.  No open lesions.  No other concerns today.    PCP: Merrilee Seashore, MD  He is on Eliquis for A-fib  Objective: AAO 3, NAD DP/PT pulses palpable, CRT less than 3 seconds Nails hypertrophic, dystrophic, elongated, brittle, discolored 10. There is tenderness overlying the nails 1-5 bilaterally. There is no surrounding erythema or drainage along the nail sites.  No open lesions or pre-ulcerative lesions are identified. No pain with calf compression, swelling, warmth, erythema.  Assessment: Patient presents with symptomatic onychomycosis, on Eliquis  Plan: -Treatment options including alternatives, risks, complications were discussed -Nails sharply debrided 10 without complication/bleeding. -Discussed daily foot inspection. If there are any changes, to call the office immediately.  -Follow-up as scheduled or sooner if any problems are to arise. In the meantime, encouraged to call the office with any questions, concerns, changes symptoms.  Return in about 9 weeks (around 08/24/2022).  Trula Slade DPM

## 2022-07-01 ENCOUNTER — Telehealth: Payer: Self-pay | Admitting: Cardiology

## 2022-07-01 DIAGNOSIS — M25511 Pain in right shoulder: Secondary | ICD-10-CM | POA: Diagnosis not present

## 2022-07-01 DIAGNOSIS — M25512 Pain in left shoulder: Secondary | ICD-10-CM | POA: Diagnosis not present

## 2022-07-01 NOTE — Telephone Encounter (Signed)
Pt c/o of Chest Pain: STAT if CP now or developed within 24 hours  1. Are you having CP right now?  Yes more of a discomfort and   a little tightness on the right side No sharp pain   2. Are you experiencing any other symptoms (ex. SOB, nausea, vomiting, sweating)?  Lightheadedness, unsettled stomach  3. How long have you been experiencing CP?  Past few days   4. Is your CP continuous or coming and going?  Continuous   5. Have you taken Nitroglycerin?  No  ?

## 2022-07-01 NOTE — Telephone Encounter (Signed)
Received call from patient c/o intermittent chest tightness.   Reports intermittent chest tightness, HA, "uneasy" stomach x 1-1.5 weeks.   No SOB.  Symptoms come and go.  Not correlated with exertion or rest.   Does not check BP or HR at home.   Denies palpitations or heart racing.  Reports symptoms are making him feel "uneasy" and would like to be evaluated asap.  No symptoms currently.   Patient of Dr. Evern Bio OV 07/2021 Hx of: PAF, HTN, HLD, MVP.    Appt scheduled tomorrow with Dr. Steva Colder aware.  ER precautions discussed.

## 2022-07-02 ENCOUNTER — Telehealth: Payer: Self-pay | Admitting: Cardiology

## 2022-07-02 ENCOUNTER — Ambulatory Visit: Payer: Medicare Other | Attending: Cardiology | Admitting: Cardiology

## 2022-07-02 ENCOUNTER — Encounter: Payer: Self-pay | Admitting: Cardiology

## 2022-07-02 VITALS — BP 118/82 | HR 72 | Ht 69.0 in | Wt 159.0 lb

## 2022-07-02 DIAGNOSIS — I480 Paroxysmal atrial fibrillation: Secondary | ICD-10-CM | POA: Diagnosis not present

## 2022-07-02 DIAGNOSIS — I10 Essential (primary) hypertension: Secondary | ICD-10-CM | POA: Diagnosis not present

## 2022-07-02 DIAGNOSIS — I4729 Other ventricular tachycardia: Secondary | ICD-10-CM

## 2022-07-02 DIAGNOSIS — I341 Nonrheumatic mitral (valve) prolapse: Secondary | ICD-10-CM | POA: Diagnosis not present

## 2022-07-02 DIAGNOSIS — E782 Mixed hyperlipidemia: Secondary | ICD-10-CM

## 2022-07-02 MED ORDER — AMIODARONE HCL 200 MG PO TABS: ORAL_TABLET | ORAL | 0 refills | Status: DC

## 2022-07-02 NOTE — Progress Notes (Signed)
Primary Care Provider: Hoyt Koch, MD [place] Cardiologist: Kirk Ruths, MD Electrophysiologist: None  Clinic Note: Chief Complaint  Patient presents with   Chest discomfort    -Just does not feel right, no energy, discomfort/irregular sensation in his chest.  Exertional dyspnea   Atrial Fibrillation    Currently in A-fib.  Symptomatic, although he is not aware of being in A-fib.    ===================================  ASSESSMENT/PLAN   Problem List Items Addressed This Visit       Cardiology Problems   Paroxysmal A-fib (HCC) - Primary (Chronic)    Thankfully, he is on Eliquis.  He is now in A-fib and clearly symptomatic although he is not aware of it being A-fib, he is having fatigue, irregular sensation in his chest.  Off-and-on chest discomfort and exertional dyspnea.  We need to confirm that his symptoms are from being in A-fib and not from potentially angina. Would like to get 2D echocardiogram plus or minus stress test depending on how his symptoms are after cardioversion.  Would like to restore sinus rhythm.  We discussed setting him up for DCCV, but would like to try and attempt to make, cardioversion first.  He does not want to be on long-term antiarrhythmic, but we would be agreeable to short course of antiarrhythmic.  Plan: Short course of amiodarone: Start 4 mg tonight and 400 twice daily tomorrow then 40 mg daily for 4 days and then stop. He is seeing Jory Sims, NP on January 5 => if he remains in A-fib, was scheduled for DCCV. He has a A-fib clinic follow-up appointment scheduled on January 9.  Reassess symptoms out of A-fib, if he is back to baseline, then we do not probably need to proceed with stress test, however if he is still having intermittent chest discomfort, would probably consider stress testing.  He is probably also due for an echocardiogram soon for his valvular disease. = Should be due in January or February 2024.   Can order once he is out of A-fib.      Relevant Medications   amiodarone (PACERONE) 200 MG tablet   Other Relevant Orders   EKG 12-Lead (Completed)   Amb Referral to AFIB Clinic   Mitral valvular prolapse (Chronic)    Should be due for follow-up echocardiogram here in January-February 2024. Would like Dr. Quay Burow to be out of A-fib for recheck 1.  Can be ordered once he has had cardioversion.  The presence of mitral prolapse and regurgitation makes him more susceptible to being symptomatic with A-fib.  Hence the reason for being aggressive with restoring sinus rhythm.      Relevant Medications   amiodarone (PACERONE) 200 MG tablet   Essential hypertension (Chronic)    Blood pressure is well-controlled on current meds.  He is not on any rate control or the rhythm control agents.  His resting heart rate has been okay and he is rate controlled off of AV nodal agent.  He is on moderate dose of lisinopril and HCTZ.      Relevant Medications   amiodarone (PACERONE) 200 MG tablet   ===================================  HPI:    Holman Bonsignore is a 76 y.o. male with a PMH notable for PAF (diagnosed in 2020 following COVID and HTN who presents today as an urgent "work-in " patient for chest discomfort.  Brace Welte was last seen on by Dr. Stanford Breed back in January 2023.  Was in sinus rhythm.  Maintained on Eliquis for DOAC.  No  rate or rhythm control agent noted.  No chest pain dyspnea palpitations.  No syncope or syncope. Plan routine follow-up for MVP.  No medication adjustments made  Recent Hospitalizations: None  Reviewed  CV studies:    The following studies were reviewed today: (if available, images/films reviewed: From Epic Chart or Care Everywhere)  Echo January 2022: Normal LV size and function.  GR 1 DD.  Mild RV enlargement.  Moderate LA & RA enlargement.  Mild MR.  MR likely related to mildly for MR/MVP.  Moderate TR, mildly elevated RAP.Marland Kitchen  Moderate ao valve  thickening.  Mild AOV sclerosis but no stenosis.  Interval History:   Danta Baumgardner presents today for follow-up to discuss just a general sense of feeling tired and weak -> getting progressive the more noticeable over the last 1 and half to 2 weeks.  He has had off-and-on right-sided chest pain but also some left-sided chest pain associated with some difficulty breathing.  He notes exercise intolerance.  He denies a sensation of rapid irregular heartbeats or skipped beats etc.  He also denies any true exertional chest tightness or pressure.  Just notes excess fatigue.  He mostly notes the dyspnea when walking up stairs, or carrying anything.  He will get short of breath, and then when he stops he may notice a little tightness in the chest but not with exertion. He denies any PND, orthopnea with trivial edema.  No syncope or near syncope or TIA/amaurosis fugax.  What he does note though is that he has had exercise intolerance, feeling more lethargic and weak.  He thinks that about a week or 2 ago before the symptoms started he may have been drinking a little extra coffee and eating sweets.  This could have been what started.  He denies however any illnesses of fevers or chills that would have triggered an episode of A-fib.  He himself not aware that he is in A-fib today. He just notes an uneasy sensation in his chest, unusual discomfort but is not necessarily different or worse with exertion, just that he has some shortness of breath with exertion.  REVIEWED OF SYSTEMS   Review of Systems  Constitutional:  Positive for malaise/fatigue. Negative for chills, fever and weight loss.  HENT:  Negative for congestion and nosebleeds.   Respiratory:  Positive for shortness of breath (With exertion). Negative for cough and hemoptysis.   Gastrointestinal:  Negative for blood in stool, constipation, diarrhea and melena.  Genitourinary:  Negative for hematuria.  Musculoskeletal:  Negative for falls, joint pain  and myalgias.  Neurological:  Negative for dizziness, focal weakness and headaches.  Psychiatric/Behavioral:  Negative for depression and memory loss. The patient is not nervous/anxious and does not have insomnia.   All other systems reviewed and are negative.   I have reviewed and (if needed) personally updated the patient's problem list, medications, allergies, past medical and surgical history, social and family history.   PAST MEDICAL HISTORY   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    Atrial fibrillation (HCC)    Back pain    Dyspnea    history of covid 09/2018 and with extertion    Dysrhythmia    GERD (gastroesophageal reflux disease)    Headache    Hypertension    Pain    PAIN AND OA RT KNEE   Pneumonia    YEARS AGO   Psoriasis    Snoring  04/27/2014    PAST SURGICAL HISTORY   Past Surgical History:  Procedure Laterality Date   INGUINAL HERNIA REPAIR Left 01/29/2021   Procedure: LEFT INGUINAL HERNIA REPAIR WITH MESH;  Surgeon: Rolm Bookbinder, MD;  Location: [facility];  Service: General;  Laterality: Left;   KNEE ARTHROSCOPY Right 01/31/2014   Procedure: RIGHT ARTHROSCOPY KNEE WITH DEBRIDMENT CHONDROPLASTY;  Surgeon: [name], MD;  Location: [facility];  Service: Orthopedics;  Laterality: Right;   LUMP REMOVED FROM LEFT BREAST Left 07/06/2006   STATES SURGERY  AT [facility] - LOCAL- NOT CANCER 5 OR MORE YRS AGO   MORTON'S NEUROMA REMOVED     TONSILLECTOMY     AS A CHILD   TRANSTHORACIC ECHOCARDIOGRAM  07/2020   Normal LV size and function.  GR 1 DD.  Mild RV enlargement.  Moderate LA & RA enlargement.  Mild MR.  MR likely related to mildly for MR/MVP.  Moderate TR, mildly elevated RAP.Marland Kitchen  Moderate ao valve thickening.  Mild AOV sclerosis but no stenosis.    Immunization History  Administered Date(s) Administered   PNEUMOCOCCAL CONJUGATE-20 03/06/2022   Pneumococcal Conjugate-13 07/30/2006    Pneumococcal Polysaccharide-23 07/30/2006, 06/14/2018   Tdap 07/28/2018   Zoster Recombinat (Shingrix) 08/16/2020, 12/06/2020   Zoster, Live 05/14/2012    MEDICATIONS/ALLERGIES   Current Meds  Medication Sig   apixaban (ELIQUIS) 5 MG TABS tablet Take 1 tablet (5 mg total) by mouth 2 (two) times daily.   CALCIUM-MAGNESIUM-ZINC PO Take 1 tablet by mouth 2 (two) times daily.    cholecalciferol (VITAMIN D) 1000 UNITS tablet Take 1,000 Units by mouth daily.    Clobetasol Propionate (TEMOVATE) 0.05 % external spray Apply topically 2 (two) times daily.   Cod Liver Oil CAPS    cyclobenzaprine (FLEXERIL) 10 MG tablet Take 1 tablet (10 mg total) by mouth daily as needed for muscle spasms (BACK).   hydroxychloroquine (PLAQUENIL) 200 MG tablet Take 200 mg by mouth 2 (two) times daily.   lisinopril-hydrochlorothiazide (ZESTORETIC) 10-12.5 MG tablet Take 1 tablet by mouth every morning.   Lutein-Zeaxanthin 25-5 MG CAPS Take by mouth.   misoprostol (CYTOTEC) 100 MCG tablet Take 100 mcg by mouth 2 (two) times daily. Pt taking as needed.   Multiple Vitamin (MULTIVITAMIN WITH MINERALS) TABS tablet Take 1 tablet by mouth daily.   omeprazole (PRILOSEC) 40 MG capsule Take 1 capsule (40 mg total) by mouth daily.   pravastatin (PRAVACHOL) 20 MG tablet Take 1 tablet (20 mg total) by mouth daily.   triamcinolone cream (KENALOG) 0.1 % Apply 1 application topically daily. Apply to psoriasis rash (Patient taking differently: Apply 1 application  topically daily. Apply to psoriasis rash. Pt takes as needed.)   vitamin B-12 (CYANOCOBALAMIN) 1000 MCG tablet Take 1,000 mcg by mouth daily.    Allergies  Allergen Reactions   Azithromycin Diarrhea and Nausea And Vomiting   Latex     WHEN PT USED TO WEAR LATEX GLOVES AT WORK HE NOTICED SKIN REDNESS AND ITCHING OF HANDS.   Prednisone Other (See Comments)    "makes me angry and I feel weird."    SOCIAL HISTORY/FAMILY HISTORY   Reviewed in Epic:  Pertinent findings:   Social History   Tobacco Use   Smoking status: Former    Packs/day: 0.25    Types: Cigarettes    Quit date: 1990    Years since quitting: 34.0   Smokeless tobacco: Never  Vaping Use   Vaping Use: Never used  Substance Use Topics  Alcohol use: Yes    Comment: QUIT SMOKING ABOUT 15 YRS AGO--WAS ONLY 2 OR 3 CIGARETTES WITH A BEER ON WEEK ENDS   Drug use: No   Social History   Social History Narrative   Right handed, caffeine 2-3 cups daily, married, no kids, has baby bird.  WC back injury, so not working at this time.  BS in econmics.    OBJCTIVE -PE, EKG, labs   Wt Readings from Last 3 Encounters:  07/02/22 159 lb (72.1 kg)  03/12/22 151 lb (68.5 kg)  03/10/22 152 lb (68.9 kg)    Physical Exam: BP 118/82   Pulse 72   Ht '5\' 9"'$  (1.753 m)   Wt 159 lb (72.1 kg)   SpO2 97%   BMI 23.48 kg/m  Physical Exam Vitals reviewed.  Constitutional:      General: He is not in acute distress.    Appearance: Normal appearance. He is normal weight. He is not ill-appearing or toxic-appearing.  HENT:     Head: Normocephalic and atraumatic.  Neck:     Vascular: No carotid bruit, hepatojugular reflux or JVD.  Cardiovascular:     Rate and Rhythm: Normal rate. Rhythm irregularly irregular. No extrasystoles are present.    Chest Wall: PMI is not displaced.     Pulses: Normal pulses.     Heart sounds: S1 normal and S2 normal. Heart sounds not distant. No murmur (Cannot exclude soft SEM at RUSB.  Difficult to assess with A-fib) heard.    No friction rub. No gallop.  Pulmonary:     Effort: Pulmonary effort is normal. No respiratory distress.     Breath sounds: Normal breath sounds. No wheezing, rhonchi or rales.  Chest:     Chest wall: No tenderness.  Abdominal:     General: Bowel sounds are normal.     Palpations: Abdomen is soft.  Musculoskeletal:        General: No swelling. Normal range of motion.     Cervical back: Normal range of motion and neck supple.  Skin:    General:  Skin is warm and dry.  Neurological:     General: No focal deficit present.     Mental Status: He is alert and oriented to person, place, and time.     Cranial Nerves: No cranial nerve deficit.     Gait: Gait normal.  Psychiatric:        Mood and Affect: Mood normal.        Behavior: Behavior normal.        Thought Content: Thought content normal.        Judgment: Judgment normal.     Adult ECG Report  Rate: 72 ;  Rhythm: sinus arrhythmia, atrial fibrillation, and premature ventricular contractions (PVC) -controlled ventricular rate with nonspecific ST-T wave changes.;   Narrative Interpretation: Now in A-fib.  Recent Labs: Reviewed Lab Results  Component Value Date   CHOL 189 02/27/2022   HDL 71.70 02/27/2022   LDLCALC 102 (H) 02/27/2022   TRIG 73.0 02/27/2022   CHOLHDL 3 02/27/2022   Lab Results  Component Value Date   CREATININE 0.83 02/27/2022   BUN 18 02/27/2022   NA 141 02/27/2022   K 4.6 02/27/2022   CL 103 02/27/2022   CO2 31 02/27/2022      Latest Ref Rng & Units 02/27/2022   11:34 AM 01/29/2020   11:08 AM 02/21/2015    2:07 PM  CBC  WBC 4.0 - 10.5 K/uL 6.5  6.1  7.6   Hemoglobin 13.0 - 17.0 g/dL 13.4  13.0  13.9   Hematocrit 39.0 - 52.0 % 40.5  41.2  40.3   Platelets 150.0 - 400.0 K/uL 224.0  250  201     No results found for: "HGBA1C" Lab Results  Component Value Date   TSH 0.90 02/27/2022    ================================================== I spent a total of 46 minutes with the patient spent in direct patient consultation.  Additional time spent with chart review  / charting (studies, outside notes, etc): 25 min Total Time: 69 min  Current medicines are reviewed at length with the patient today.  (+/- concerns) N/A  Notice: This dictation was prepared with Dragon dictation along with smart phrase technology. Any transcriptional errors that result from this process are unintentional and may not be corrected upon review.  Studies Ordered:    Orders Placed This Encounter  Procedures   Amb Referral to AFIB Clinic   EKG 12-Lead   Meds ordered this encounter  Medications   amiodarone (PACERONE) 200 MG tablet    Sig: Take 2 tablets (400 mg total) by mouth daily for 1 day, THEN 2 tablets (400 mg total) 2 (two) times daily for 1 day, THEN 2 tablets (400 mg total) daily for 4 days.    Dispense:  14 tablet    Refill:  0    Patient Instructions / Medication Changes & Studies & Tests Ordered   Patient Instructions  Medication Instructions:   Start Amiodarone  400 mg ( 2 tablets ) take tonight 400 mg  twice a day for one day ( starting tomorrow) 07/03/22 400 mg  once daily for 4 days then stop   *If you need a refill on your cardiac medications before your next appointment, please call your pharmacy*   Lab Work: Not needed    Testing/Procedures: Not needed   Follow-Up: At [facility], you and your health needs are our priority.  As part of our continuing mission to provide you with exceptional heart care, we have created designated Provider Care Teams.  These Care Teams include your primary Cardiologist (physician) and Advanced Practice Providers (APPs -  Physician Assistants and Nurse Practitioners) who all work together to provide you with the care you need, when you need it.     Your next appointment:   1 week(s)  ( early next week)   The format for your next appointment:   In Person  Provider:   You will follow up in the [facility] located at [facility]. Your provider will be: Roderic Palau, NP or Clint R. Marlene Lard, PA-C     Leonie Man, MD, MS Glenetta Hew, M.D., M.S. Interventional Cardiologist  [facility]  Pager # [phone] Phone # [phone] [address] [place], [place] [address]   Thank you for choosing [facility] [place]!!

## 2022-07-02 NOTE — Patient Instructions (Addendum)
Medication Instructions:   Start Amiodarone  400 mg ( 2 tablets ) take tonight 400 mg  twice a day for one day ( starting tomorrow) 07/03/22 400 mg  once daily for 4 days then stop   *If you need a refill on your cardiac medications before your next appointment, please call your pharmacy*   Lab Work: Not needed    Testing/Procedures: Not needed   Follow-Up: At [facility], you and your health needs are our priority.  As part of our continuing mission to provide you with exceptional heart care, we have created designated Provider Care Teams.  These Care Teams include your primary Cardiologist (physician) and Advanced Practice Providers (APPs -  Physician Assistants and Nurse Practitioners) who all work together to provide you with the care you need, when you need it.     Your next appointment:   1 week(s)  ( early next week)   The format for your next appointment:   In Person  Provider:   You will follow up in the [facility] located at [facility]. Your provider will be: Roderic Palau, NP or Clint R. Fenton, PA-C

## 2022-07-02 NOTE — Telephone Encounter (Signed)
Pt c/o medication issue:  1. Name of Medication:   amiodarone (PACERONE) 200 MG tablet   2. How are you currently taking this medication (dosage and times per day)?   3. Are you having a reaction (difficulty breathing--STAT)?   4. What is your medication issue?   Caller stated they will need clarification on the instructions for this medication.

## 2022-07-02 NOTE — Telephone Encounter (Signed)
Returned call to [place], [place]: Spoke with the Pharmacist- Saralyn Pilar- clarification for Medication order  Medication Instructions:   Start Amiodarone  400 mg ( 2 tablets ) take tonight 400 mg  twice a day for one day ( starting tomorrow) 07/03/22 400 mg  once daily for 4 days then stop

## 2022-07-03 ENCOUNTER — Encounter: Payer: Self-pay | Admitting: Cardiology

## 2022-07-03 DIAGNOSIS — I341 Nonrheumatic mitral (valve) prolapse: Secondary | ICD-10-CM | POA: Insufficient documentation

## 2022-07-03 NOTE — Telephone Encounter (Signed)
Patient was seen 07/02/22. Started amiodarone , f/u with Arnold Long DNP 06/09/22 ,if needed afib clinic on 07/14/22 per Dr Ellyn Hack.

## 2022-07-03 NOTE — Assessment & Plan Note (Signed)
Blood pressure is well-controlled on current meds.  He is not on any rate control or the rhythm control agents.  His resting heart rate has been okay and he is rate controlled off of AV nodal agent.  He is on moderate dose of lisinopril and HCTZ.

## 2022-07-03 NOTE — Assessment & Plan Note (Signed)
Should be due for follow-up echocardiogram here in January-February 2024. Would like Richard [name] to be out of A-fib for recheck 1.  Can be ordered once he has had cardioversion.  The presence of mitral prolapse and regurgitation makes him more susceptible to being symptomatic with A-fib.  Hence the reason for being aggressive with restoring sinus rhythm.

## 2022-07-03 NOTE — Assessment & Plan Note (Addendum)
Thankfully, he is on Eliquis.  He is now in A-fib and clearly symptomatic although he is not aware of it being A-fib, he is having fatigue, irregular sensation in his chest.  Off-and-on chest discomfort and exertional dyspnea.  We need to confirm that his symptoms are from being in A-fib and not from potentially angina. Would like to get 2D echocardiogram plus or minus stress test depending on how his symptoms are after cardioversion.  Would like to restore sinus rhythm.  We discussed setting him up for DCCV, but would like to try and attempt to make, cardioversion first.  He does not want to be on long-term antiarrhythmic, but we would be agreeable to short course of antiarrhythmic.  Plan: Short course of amiodarone: Start 4 mg tonight and 400 twice daily tomorrow then 40 mg daily for 4 days and then stop. He is seeing Jory Sims, NP on January 5 => if he remains in A-fib, was scheduled for DCCV. He has a A-fib clinic follow-up appointment scheduled on January 9.  Reassess symptoms out of A-fib, if he is back to baseline, then we do not probably need to proceed with stress test, however if he is still having intermittent chest discomfort, would probably consider stress testing.  He is probably also due for an echocardiogram soon for his valvular disease. = Should be due in January or February 2024.  Can order once he is out of A-fib.

## 2022-07-07 ENCOUNTER — Telehealth: Payer: Self-pay | Admitting: Internal Medicine

## 2022-07-07 DIAGNOSIS — I480 Paroxysmal atrial fibrillation: Secondary | ICD-10-CM

## 2022-07-07 NOTE — Telephone Encounter (Signed)
PT calls today in need of a follow up call when possible. PT was wanting to make sure that the appropriate orders were placed on their future labs, but PT would not specify with me what all they were needing to have on the future labs.  CB: [phone]

## 2022-07-07 NOTE — Telephone Encounter (Signed)
Review with pt labs ordered and if questions forward to me

## 2022-07-09 ENCOUNTER — Telehealth: Payer: Self-pay | Admitting: Cardiology

## 2022-07-09 NOTE — Telephone Encounter (Signed)
See previous note

## 2022-07-09 NOTE — Telephone Encounter (Signed)
Pt would like to speak to someone about his recent EKG and appt tomorrow. Please advise.

## 2022-07-09 NOTE — Telephone Encounter (Signed)
Added thyroid

## 2022-07-09 NOTE — Telephone Encounter (Signed)
Called patient back- he reports that he cancelled his appt for an EKG tomorrow because "he feels perfectly fine." No pain, no palpitations. He never even took the Amiodarone because he felt fine and after researching the medication, he was not comfortable taking it.  He said that his s/s started 3-4 weeks ago when he stopped taking omeprazole. That's when he started having some chest discomfort. But says that since taking pepcid and tums, he is no longer having any symptoms. He asked if maybe he needed an EKG in 2 weeks or so? I spoke with him about his EKG findings of Atrial Fibrillation when he was here on 07/02/22 and in the note it said that he was not aware of any arrhythmia. I recommended that he come in for EKG tomorrow/reschedule the appt to see if he is still in A-Fib, because he could still be in and out of it and not "feel it."  Patient agreed to reschedule appt for EKG tomorrow and f/u visit for A-Fib, 07/10/22 at 1000 with Jory Sims, NP. Told him that I will send this information to his providers.

## 2022-07-09 NOTE — Telephone Encounter (Signed)
Informed patient about is lab work that he requested

## 2022-07-09 NOTE — Progress Notes (Deleted)
Cardiology Clinic Note   Patient Name: Richard [name] Date of Encounter: 07/09/2022  Primary Care Provider:  Hoyt Koch, MD Primary Cardiologist:  Kirk Ruths, MD  Patient Profile    77 year old male with history of HTN and Atrial fib (cardiac unaware), was placed on amiodarone and planned for DCCV on last office visit on 07/02/2022 with Dr. 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 He was not placed on anticoagulation. Echocardiogram was ordered with history of mitral valve prolapse and MR, with symptoms of dyspnea worsened by atrial fib.   Echocardiogram on 08/02/2020 revealed normal LV function, with LA size moderately dilated. RA mildly dilated, moderate TR.   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    Atrial fibrillation (HCC)    Back pain    Dyspnea    history of covid 09/2018 and with extertion    Dysrhythmia    GERD (gastroesophageal reflux disease)    Headache    Hypertension    Pain    PAIN AND OA RT KNEE   Pneumonia    YEARS AGO   Psoriasis    Snoring 04/27/2014   Past Surgical History:  Procedure Laterality Date   INGUINAL HERNIA REPAIR Left 01/29/2021   Procedure: LEFT INGUINAL HERNIA REPAIR WITH MESH;  Surgeon: Rolm Bookbinder, MD;  Location: [facility];  Service: General;  Laterality: Left;   KNEE ARTHROSCOPY Right 01/31/2014   Procedure: RIGHT ARTHROSCOPY KNEE WITH DEBRIDMENT CHONDROPLASTY;  Surgeon: [name], MD;  Location: [facility];  Service: Orthopedics;  Laterality: Right;   LUMP REMOVED FROM LEFT BREAST Left 07/06/2006   STATES SURGERY  AT [facility] - LOCAL- NOT CANCER 5 OR MORE YRS AGO   MORTON'S NEUROMA REMOVED     TONSILLECTOMY     AS A CHILD   TRANSTHORACIC ECHOCARDIOGRAM  07/2020   Normal LV size and function.  GR 1 DD.  Mild RV enlargement.   Moderate LA & RA enlargement.  Mild MR.  MR likely related to mildly for MR/MVP.  Moderate TR, mildly elevated RAP.Marland Kitchen  Moderate ao valve thickening.  Mild AOV sclerosis but no stenosis.    Allergies  Allergies  Allergen Reactions   Azithromycin Diarrhea and Nausea And Vomiting   Latex     WHEN PT USED TO WEAR LATEX GLOVES AT WORK HE NOTICED SKIN REDNESS AND ITCHING OF HANDS.   Prednisone Other (See Comments)    "makes me angry and I feel weird."    History of Present Illness    ***  Home Medications    Current Outpatient Medications  Medication Sig Dispense Refill   amiodarone (PACERONE) 200 MG tablet Take 2 tablets (400 mg total) by mouth daily for 1 day, THEN 2 tablets (400 mg total) 2 (two) times daily for 1 day, THEN 2 tablets (400 mg total) daily for 4 days. 14 tablet 0   apixaban (ELIQUIS) 5 MG TABS tablet Take 1 tablet (5 mg total) by mouth 2 (two) times daily. 180 tablet 3   CALCIUM-MAGNESIUM-ZINC PO Take 1 tablet by mouth 2 (two) times daily.      cholecalciferol (VITAMIN D) 1000 UNITS tablet Take 1,000 Units by mouth daily.      Clobetasol Propionate (TEMOVATE) 0.05 % external spray  Apply topically 2 (two) times daily. 59 mL 6   Cod Liver Oil CAPS      cyclobenzaprine (FLEXERIL) 10 MG tablet Take 1 tablet (10 mg total) by mouth daily as needed for muscle spasms (BACK). 90 tablet 1   hydroxychloroquine (PLAQUENIL) 200 MG tablet Take 200 mg by mouth 2 (two) times daily.     lisinopril-hydrochlorothiazide (ZESTORETIC) 10-12.5 MG tablet Take 1 tablet by mouth every morning. 90 tablet 3   Lutein-Zeaxanthin 25-5 MG CAPS Take by mouth.     misoprostol (CYTOTEC) 100 MCG tablet Take 100 mcg by mouth 2 (two) times daily. Pt taking as needed.     Multiple Vitamin (MULTIVITAMIN WITH MINERALS) TABS tablet Take 1 tablet by mouth daily.     omeprazole (PRILOSEC) 40 MG capsule Take 1 capsule (40 mg total) by mouth daily. 90 capsule 3   pravastatin (PRAVACHOL) 20 MG tablet Take 1 tablet  (20 mg total) by mouth daily. 90 tablet 3   triamcinolone cream (KENALOG) 0.1 % Apply 1 application topically daily. Apply to psoriasis rash (Patient taking differently: Apply 1 application  topically daily. Apply to psoriasis rash. Pt takes as needed.) 30 g 1   vitamin B-12 (CYANOCOBALAMIN) 1000 MCG tablet Take 1,000 mcg by mouth daily.     No current facility-administered medications for this visit.     Family History    Family History  Problem Relation Age of Onset   Diabetes Mother    Hypertension Mother    Heart disease Mother        has a stent    Stroke Father 58   Hypertension Father    He indicated that his mother is alive. He indicated that his father is deceased. He indicated that his brother is alive.  Social History    Social History   Socioeconomic History   Marital status: Married    Spouse name: Not on file   Number of children: 0   Years of education: Not on file   Highest education level: Not on file  Occupational History   Not on file  Tobacco Use   Smoking status: Former    Packs/day: 0.25    Types: Cigarettes    Quit date: 1990    Years since quitting: 34.0   Smokeless tobacco: Never  Vaping Use   Vaping Use: Never used  Substance and Sexual Activity   Alcohol use: Yes    Comment: QUIT SMOKING ABOUT 15 YRS AGO--WAS ONLY 2 OR 3 CIGARETTES WITH A BEER ON WEEK ENDS   Drug use: No   Sexual activity: Yes  Other Topics Concern   Not on file  Social History Narrative   Right handed, caffeine 2-3 cups daily, married, no kids, has baby bird.  WC back injury, so not working at this time.  BS in econmics.   Social Determinants of Health   Financial Resource Strain: Not on file  Food Insecurity: Not on file  Transportation Needs: Not on file  Physical Activity: Not on file  Stress: Not on file  Social Connections: Not on file  Intimate Partner Violence: Not on file     Review of Systems    General:  No chills, fever, night sweats or weight  changes.  Cardiovascular:  No chest pain, dyspnea on exertion, edema, orthopnea, palpitations, paroxysmal nocturnal dyspnea. Dermatological: No rash, lesions/masses Respiratory: No cough, dyspnea Urologic: No hematuria, dysuria Abdominal:   No nausea, vomiting, diarrhea, bright red blood per rectum, melena, or hematemesis Neurologic:  No visual changes, wkns, changes in mental status. All other systems reviewed and are otherwise negative except as noted above.     Physical Exam    VS:  There were no vitals taken for this visit. , BMI There is no height or weight on file to calculate BMI.     GEN: Well nourished, well developed, in no acute distress. HEENT: normal. Neck: Supple, no JVD, carotid bruits, or masses. Cardiac: RRR, no murmurs, rubs, or gallops. No clubbing, cyanosis, edema.  Radials/DP/PT 2+ and equal bilaterally.  Respiratory:  Respirations regular and unlabored, clear to auscultation bilaterally. GI: Soft, nontender, nondistended, BS + x 4. MS: no deformity or atrophy. Skin: warm and dry, no rash. Neuro:  Strength and sensation are intact. Psych: Normal affect.  Accessory Clinical Findings    ECG personally reviewed by me today- *** - No acute changes  Lab Results  Component Value Date   WBC 6.5 02/27/2022   HGB 13.4 02/27/2022   HCT 40.5 02/27/2022   MCV 85.3 02/27/2022   PLT 224.0 02/27/2022   Lab Results  Component Value Date   CREATININE 0.83 02/27/2022   BUN 18 02/27/2022   NA 141 02/27/2022   K 4.6 02/27/2022   CL 103 02/27/2022   CO2 31 02/27/2022   Lab Results  Component Value Date   ALT 42 02/27/2022   AST 27 02/27/2022   ALKPHOS 73 02/27/2022   BILITOT 0.9 02/27/2022   Lab Results  Component Value Date   CHOL 189 02/27/2022   HDL 71.70 02/27/2022   LDLCALC 102 (H) 02/27/2022   TRIG 73.0 02/27/2022   CHOLHDL 3 02/27/2022    No results found for: "HGBA1C"  Review of Prior Studies: Echocardiogram 08/02/2020 1. Left ventricular  ejection fraction, by estimation, is 60 to 65%. The  left ventricle has normal function. The left ventricle has no regional  wall motion abnormalities. Left ventricular diastolic parameters are  consistent with Grade I diastolic  dysfunction (impaired relaxation).   2. Right ventricular systolic function is normal. The right ventricular  size is mildly enlarged. There is normal pulmonary artery systolic  pressure. The estimated right ventricular systolic pressure is 21.3 mmHg.   3. Left atrial size was moderately dilated.   4. Right atrial size was moderately dilated.   5. The mitral valve is normal in structure. Mild mitral valve  regurgitation. No evidence of mitral stenosis. There is mild holosystolic  prolapse of both leaflets of the mitral valve.   6. Tricuspid valve regurgitation is moderate.   7. The aortic valve is tricuspid. There is mild calcification of the  aortic valve. There is mild thickening of the aortic valve. Aortic valve  regurgitation is not visualized. Mild aortic valve sclerosis is present,  with no evidence of aortic valve  stenosis.   8. Aortic dilatation noted. There is borderline dilatation of the  ascending aorta, measuring 38 mm.   9. The inferior vena cava is dilated in size with >50% respiratory  variability, suggesting right atrial pressure of 8 mmHg.   Assessment & Plan   1.  ***    Current medicines are reviewed at length with the patient today.  I have spent *** min's  dedicated to the care of this patient on the date of this encounter to include pre-visit review of records, assessment, management and diagnostic testing,with shared decision making. Signed, Phill Myron. West Pugh, ANP, AACC   07/09/2022 8:32 AM      Office [phone] Fax [phone]  [phone]  Notice: This dictation was prepared with Dragon dictation along with smaller phrase technology. Any transcriptional errors that result from this process are unintentional and may not be  corrected upon review.

## 2022-07-10 ENCOUNTER — Ambulatory Visit: Payer: Medicare Other | Attending: Adult Health | Admitting: Adult Health

## 2022-07-10 ENCOUNTER — Ambulatory Visit: Payer: Medicare Other | Admitting: Adult Health

## 2022-07-10 ENCOUNTER — Encounter: Payer: Self-pay | Admitting: Adult Health

## 2022-07-10 VITALS — BP 100/80 | HR 82 | Ht 69.5 in | Wt 160.0 lb

## 2022-07-10 DIAGNOSIS — E7800 Pure hypercholesterolemia, unspecified: Secondary | ICD-10-CM

## 2022-07-10 DIAGNOSIS — I4819 Other persistent atrial fibrillation: Secondary | ICD-10-CM | POA: Diagnosis not present

## 2022-07-10 DIAGNOSIS — I10 Essential (primary) hypertension: Secondary | ICD-10-CM | POA: Diagnosis not present

## 2022-07-10 NOTE — Progress Notes (Signed)
Cardiology Clinic Note   Patient Name: Richard [name] Date of Encounter: 07/10/2022  Primary Care Provider:  Hoyt Koch, MD Primary Cardiologist:  Kirk Ruths, MD  Patient Profile    77 year old male with history of PAF who was seen by Dr. Ellyn Hack on 07/02/2022.  He was cardiac unaware except symptoms of dyspnea.  Dr. Ellyn Hack set him up for DCCV if he did not convert to normal sinus rhythm on amiodarone load.  He was started on 400 mg x 2 in 1 day, and then 40 mg daily for 4 days and then stop.  He was also to follow-up in the A-fib clinic on January 9.  Other history includes mitral valve prolapse and is due for echocardiogram in January 2024, and hypertension.  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    Atrial fibrillation (HCC)    Back pain    Dyspnea    history of covid 09/2018 and with extertion    Dysrhythmia    GERD (gastroesophageal reflux disease)    Headache    Hypertension    Pain    PAIN AND OA RT KNEE   Pneumonia    YEARS AGO   Psoriasis    Snoring 04/27/2014   Past Surgical History:  Procedure Laterality Date   INGUINAL HERNIA REPAIR Left 01/29/2021   Procedure: LEFT INGUINAL HERNIA REPAIR WITH MESH;  Surgeon: Rolm Bookbinder, MD;  Location: [facility];  Service: General;  Laterality: Left;   KNEE ARTHROSCOPY Right 01/31/2014   Procedure: RIGHT ARTHROSCOPY KNEE WITH DEBRIDMENT CHONDROPLASTY;  Surgeon: [name], MD;  Location: [facility];  Service: Orthopedics;  Laterality: Right;   LUMP REMOVED FROM LEFT BREAST Left 07/06/2006   STATES SURGERY  AT [facility] - LOCAL- NOT CANCER 5 OR MORE YRS AGO   [facility]ON'S NEUROMA REMOVED     TONSILLECTOMY     AS A CHILD   TRANSTHORACIC ECHOCARDIOGRAM  07/2020   Normal LV size and function.  GR 1 DD.  Mild RV enlargement.  Moderate LA & RA enlargement.  Mild MR.  MR likely related to mildly for MR/MVP.  Moderate TR,  mildly elevated RAP.Marland Kitchen  Moderate ao valve thickening.  Mild AOV sclerosis but no stenosis.    Allergies  Allergies  Allergen Reactions   Azithromycin Diarrhea and Nausea And Vomiting   Latex     WHEN PT USED TO WEAR LATEX GLOVES AT WORK HE NOTICED SKIN REDNESS AND ITCHING OF HANDS.   Prednisone Other (See Comments)    "makes me angry and I feel weird."    History of Present Illness    Richard [name] is a very pleasant 77 year old male here for follow-up after being seen by Dr. Ellyn Hack for symptomatic atrial fibrillation.  He was placed on amiodarone loading and plan for DCCV if he did not converted pharmacologically.  The patient, after reading about amiodarone chose not to take it as he was uncomfortable with side effects and did not wish to begin treatment with this medicine.  He did continue Eliquis as directed.  He also stopped omeprazole, and has been on Pepcid which has been very helpful for some of his symptoms of chest pressure and discomfort.  He denies any shortness of breath, dizziness, or significant fatigue.  He states that he was able to carry laundry up the stairs without any problems breathing, dizziness, or feeling  his heart rate becoming rapid.Mr. Samek has chosen not to undergo DCCV and would prefer to be treated medically only.     Home Medications    Current Outpatient Medications  Medication Sig Dispense Refill   apixaban (ELIQUIS) 5 MG TABS tablet Take 1 tablet (5 mg total) by mouth 2 (two) times daily. 180 tablet 3   CALCIUM-MAGNESIUM-ZINC PO Take 1 tablet by mouth 2 (two) times daily.      cholecalciferol (VITAMIN D) 1000 UNITS tablet Take 1,000 Units by mouth daily.      Clobetasol Propionate (TEMOVATE) 0.05 % external spray Apply topically 2 (two) times daily. 59 mL 6   Cod Liver Oil CAPS      cyclobenzaprine (FLEXERIL) 10 MG tablet Take 1 tablet (10 mg total) by mouth daily as needed for muscle spasms (BACK). 90 tablet 1   famotidine (PEPCID) 20 MG tablet Take  20 mg by mouth daily.     hydroxychloroquine (PLAQUENIL) 200 MG tablet Take 200 mg by mouth 2 (two) times daily.     lisinopril-hydrochlorothiazide (ZESTORETIC) 10-12.5 MG tablet Take 1 tablet by mouth every morning. 90 tablet 3   Lutein-Zeaxanthin 25-5 MG CAPS Take by mouth.     misoprostol (CYTOTEC) 100 MCG tablet Take 100 mcg by mouth 2 (two) times daily. Pt taking as needed.     Multiple Vitamin (MULTIVITAMIN WITH MINERALS) TABS tablet Take 1 tablet by mouth daily.     pravastatin (PRAVACHOL) 20 MG tablet Take 1 tablet (20 mg total) by mouth daily. 90 tablet 3   triamcinolone cream (KENALOG) 0.1 % Apply 1 application topically daily. Apply to psoriasis rash (Patient taking differently: Apply 1 application  topically daily. Apply to psoriasis rash. Pt takes as needed.) 30 g 1   vitamin B-12 (CYANOCOBALAMIN) 1000 MCG tablet Take 1,000 mcg by mouth daily.     No current facility-administered medications for this visit.     Family History    Family History  Problem Relation Age of Onset   Diabetes Mother    Hypertension Mother    Heart disease Mother        has a stent    Stroke Father 44   Hypertension Father    He indicated that his mother is alive. He indicated that his father is deceased. He indicated that his brother is alive.  Social History    Social History   Socioeconomic History   Marital status: Married    Spouse name: Not on file   Number of children: 0   Years of education: Not on file   Highest education level: Not on file  Occupational History   Not on file  Tobacco Use   Smoking status: Former    Packs/day: 0.25    Types: Cigarettes    Quit date: 1990    Years since quitting: 34.0   Smokeless tobacco: Never  Vaping Use   Vaping Use: Never used  Substance and Sexual Activity   Alcohol use: Yes    Comment: QUIT SMOKING ABOUT 15 YRS AGO--WAS ONLY 2 OR 3 CIGARETTES WITH A BEER ON WEEK ENDS   Drug use: No   Sexual activity: Yes  Other Topics Concern    Not on file  Social History Narrative   Right handed, caffeine 2-3 cups daily, married, no kids, has baby bird.  WC back injury, so not working at this time.  BS in econmics.   Social Determinants of Health   Financial Resource Strain: Not on file  Food Insecurity: Not on file  Transportation Needs: Not on file  Physical Activity: Not on file  Stress: Not on file  Social Connections: Not on file  Intimate Partner Violence: Not on file     Review of Systems    General:  No chills, fever, night sweats or weight changes.  Cardiovascular:  No chest pain, dyspnea on exertion, edema, orthopnea, palpitations, paroxysmal nocturnal dyspnea. Dermatological: No rash, lesions/masses Respiratory: No cough, dyspnea Urologic: No hematuria, dysuria Abdominal:   No nausea, vomiting, diarrhea, bright red blood per rectum, melena, or hematemesis Neurologic:  No visual changes, wkns, changes in mental status. All other systems reviewed and are otherwise negative except as noted above.    Physical Exam    VS:  BP 100/80   Pulse 82   Ht 5' 9.5" (1.765 m)   Wt 160 lb (72.6 kg)   SpO2 96%   BMI 23.29 kg/m  , BMI Body mass index is 23.29 kg/m.     GEN: Well nourished, well developed, in no acute distress. HEENT: normal. Neck: Supple, no JVD, carotid bruits, or masses. Cardiac: IRRR, 2/6 systolic murmurs, rubs, or gallops. No clubbing, cyanosis, edema.  Radials/DP/PT 2+ and equal bilaterally.  Respiratory:  Respirations regular and unlabored, clear to auscultation bilaterally. GI: Soft, nontender, nondistended, BS + x 4. MS: no deformity or atrophy. Skin: warm and dry, no rash. Neuro:  Strength and sensation are intact. Psych: Normal affect.  Accessory Clinical Findings    ECG personally reviewed by me today-atrial fibrillation heart rate of 82 bpm nonspecific ST-T wave abnormality.- No acute changes  Lab Results  Component Value Date   WBC 6.5 02/27/2022   HGB 13.4 02/27/2022   HCT  40.5 02/27/2022   MCV 85.3 02/27/2022   PLT 224.0 02/27/2022   Lab Results  Component Value Date   CREATININE 0.83 02/27/2022   BUN 18 02/27/2022   NA 141 02/27/2022   K 4.6 02/27/2022   CL 103 02/27/2022   CO2 31 02/27/2022   Lab Results  Component Value Date   ALT 42 02/27/2022   AST 27 02/27/2022   ALKPHOS 73 02/27/2022   BILITOT 0.9 02/27/2022   Lab Results  Component Value Date   CHOL 189 02/27/2022   HDL 71.70 02/27/2022   LDLCALC 102 (H) 02/27/2022   TRIG 73.0 02/27/2022   CHOLHDL 3 02/27/2022    No results found for: "HGBA1C"  Review of Prior Studies: Echocardiogram 08/02/2020 1. Left ventricular ejection fraction, by estimation, is 60 to 65%. The  left ventricle has normal function. The left ventricle has no regional  wall motion abnormalities. Left ventricular diastolic parameters are  consistent with Grade I diastolic  dysfunction (impaired relaxation).   2. Right ventricular systolic function is normal. The right ventricular  size is mildly enlarged. There is normal pulmonary artery systolic  pressure. The estimated right ventricular systolic pressure is 54.0 mmHg.   3. Left atrial size was moderately dilated.   4. Right atrial size was moderately dilated.   5. The mitral valve is normal in structure. Mild mitral valve  regurgitation. No evidence of mitral stenosis. There is mild holosystolic  prolapse of both leaflets of the mitral valve.   6. Tricuspid valve regurgitation is moderate.   7. The aortic valve is tricuspid. There is mild calcification of the  aortic valve. There is mild thickening of the aortic valve. Aortic valve  regurgitation is not visualized. Mild aortic valve sclerosis is present,  with no evidence  of aortic valve  stenosis.   8. Aortic dilatation noted. There is borderline dilatation of the  ascending aorta, measuring 38 mm.   9. The inferior vena cava is dilated in size with >50% respiratory  variability, suggesting right atrial  pressure of 8 mmHg.   Assessment & Plan   1.  Persistent atrial fibrillation: The patient remains in atrial fibrillation without any symptoms at this time.  He is not on rate control medication but remains on apixaban 5 mg twice daily.  He denies any issues of significant bleeding or hemoptysis.  He is chosen not to take amiodarone or proceed with DCCV at this time.  He states he is feeling much better and does not wish to have any further addition to his treatment regimen or procedures.  I have gone over him signs and symptoms of rapid A-fib with RVR to include shortness of breath, evidence of CHF.  He verbalizes understanding and will report any of the symptoms if they occur.  2.  Essential hypertension: Blood pressure is low normal likely related to atrial fibrillation.  He denies any dizziness near syncope or fatigue.  No changes in his medication regimen at this time.  He will remain on lisinopril HCTZ 10/12.5 mg daily.  3.  Hyperlipidemia: Remain on pravastatin 20 mg daily.  Labs are completed by PCP.    Current medicines are reviewed at length with the patient today.  I have spent 25 min's  dedicated to the care of this patient on the date of this encounter to include pre-visit review of records, assessment, management and diagnostic testing,with shared decision making.  Signed, Phill Myron. West Pugh, ANP, AACC   07/10/2022 11:05 AM      Office [phone] Fax [phone]  Notice: This dictation was prepared with Dragon dictation along with smaller phrase technology. Any transcriptional errors that result from this process are unintentional and may not be corrected upon review.

## 2022-07-10 NOTE — Patient Instructions (Signed)
Medication Instructions:  No changes *If you need a refill on your cardiac medications before your next appointment, please call your pharmacy*   Lab Work: No Labs If you have labs (blood work) drawn today and your tests are completely normal, you will receive your results only by: [place] (if you have MyChart) OR A paper copy in the mail If you have any lab test that is abnormal or we need to change your treatment, we will call you to review the results.   Testing/Procedures: No Testing   Follow-Up: At [facility], you and your health needs are our priority.  As part of our continuing mission to provide you with exceptional heart care, we have created designated Provider Care Teams.  These Care Teams include your primary Cardiologist (physician) and Advanced Practice Providers (APPs -  Physician Assistants and Nurse Practitioners) who all work together to provide you with the care you need, when you need it.  We recommend signing up for the patient portal called "MyChart".  Sign up information is provided on this After Visit Summary.  MyChart is used to connect with patients for Virtual Visits (Telemedicine).  Patients are able to view lab/test results, encounter notes, upcoming appointments, etc.  Non-urgent messages can be sent to your provider as well.   To learn more about what you can do with MyChart, go to [email or web address].    Your next appointment:   Follow up as Scheduled  The format for your next appointment:   In Person  Provider:   Kirk Ruths, MD

## 2022-07-14 ENCOUNTER — Ambulatory Visit (HOSPITAL_COMMUNITY): Payer: Medicare Other | Admitting: Nurse Practitioner

## 2022-07-14 ENCOUNTER — Encounter (HOSPITAL_COMMUNITY): Payer: Self-pay

## 2022-07-16 ENCOUNTER — Encounter: Payer: Self-pay | Admitting: Internal Medicine

## 2022-07-17 ENCOUNTER — Telehealth: Payer: Self-pay

## 2022-07-17 DIAGNOSIS — M25512 Pain in left shoulder: Secondary | ICD-10-CM | POA: Diagnosis not present

## 2022-07-17 DIAGNOSIS — M25511 Pain in right shoulder: Secondary | ICD-10-CM | POA: Diagnosis not present

## 2022-07-17 MED ORDER — FAMOTIDINE 20 MG PO TABS: 20.0000 mg | ORAL_TABLET | Freq: Every day | ORAL | 3 refills | Status: DC

## 2022-07-17 NOTE — Telephone Encounter (Signed)
Called pt, relied message. He states he will not take a multivitamin with magnesium into. "Thank you for calling me back, I'm glad I called to check."

## 2022-07-17 NOTE — Telephone Encounter (Signed)
Pt wants to know if it is okay to take a vitamin with magnesium with his current medications.

## 2022-07-18 ENCOUNTER — Other Ambulatory Visit: Payer: Self-pay | Admitting: Internal Medicine

## 2022-07-19 ENCOUNTER — Encounter: Payer: Self-pay | Admitting: Internal Medicine

## 2022-07-22 ENCOUNTER — Telehealth: Payer: Self-pay | Admitting: Internal Medicine

## 2022-07-22 NOTE — Telephone Encounter (Signed)
Patient called stating there are some forms that have not been signed by Dr. Sharlet Salina that he needs in order to get some prescriptions. He would like a call back the best number is [phone].

## 2022-07-22 NOTE — Telephone Encounter (Signed)
Placed this form inside of Dr Nathanial Millman office box

## 2022-07-23 ENCOUNTER — Ambulatory Visit (HOSPITAL_COMMUNITY)
Admission: RE | Admit: 2022-07-23 | Discharge: 2022-07-23 | Disposition: A | Discharge status: Home / Self Care | Payer: Medicare Other | Source: Ambulatory Visit | Attending: Nurse Practitioner | Admitting: Nurse Practitioner

## 2022-07-23 ENCOUNTER — Encounter (HOSPITAL_COMMUNITY): Payer: Self-pay | Admitting: Nurse Practitioner

## 2022-07-23 VITALS — BP 112/84 | HR 84 | Ht 69.5 in | Wt 160.0 lb

## 2022-07-23 DIAGNOSIS — Z7901 Long term (current) use of anticoagulants: Secondary | ICD-10-CM | POA: Diagnosis not present

## 2022-07-23 DIAGNOSIS — I480 Paroxysmal atrial fibrillation: Secondary | ICD-10-CM | POA: Diagnosis not present

## 2022-07-23 DIAGNOSIS — I472 Ventricular tachycardia, unspecified: Secondary | ICD-10-CM | POA: Insufficient documentation

## 2022-07-23 DIAGNOSIS — I341 Nonrheumatic mitral (valve) prolapse: Secondary | ICD-10-CM | POA: Diagnosis not present

## 2022-07-23 DIAGNOSIS — I4819 Other persistent atrial fibrillation: Secondary | ICD-10-CM

## 2022-07-23 DIAGNOSIS — I10 Essential (primary) hypertension: Secondary | ICD-10-CM | POA: Insufficient documentation

## 2022-07-23 NOTE — Progress Notes (Addendum)
Primary Care Physician: Hoyt Koch, MD Referring Physician: Dr. Ellyn Hack Cardiologist: Dr. Garey Ham Richard [name] is a 77 y.o. male with a h/o HTN, mitral valvular prolapse, NSVT, that was seen by Dr. Ellyn Hack 07/02/22 with symptoms of dyspnea and fatigue. He was found to be in Afib with v rates in the 70's with PVC's.  He was placed on short term amiodarone  with hopes of converting pt and then stopping. He was on eliquis with h/o PAF. He was then asked to f/u with K. Purcell Nails 1/5 and stated that he did not start amiodarone as he feared side effects of drug and he did not wish to be cardioverted.   He is now in the afib clinic for f/u as scheduled by Dr. Ellyn Hack. EKG shows rate controlled afib with v rate at 84 bpm, without rate control drugs on board. He confirms today that he feels well without any symptoms of dyspnea on exertion or undue fatigue. He does not wish to pursue a rhythm control strategy today. He will f/u with Dr. Stanford Breed and discuss in May with his scheduled appointment.  Today, he denies symptoms of palpitations, chest pain, shortness of breath, orthopnea, PND, lower extremity edema, dizziness, presyncope, syncope, or neurologic sequela. The patient is tolerating medications without difficulties and is otherwise without complaint today.   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    Atrial fibrillation (HCC)    Back pain    Dyspnea    history of covid 09/2018 and with extertion    Dysrhythmia    GERD (gastroesophageal reflux disease)    Headache    Hypertension    Pain    PAIN AND OA RT KNEE   Pneumonia    YEARS AGO   Psoriasis    Snoring 04/27/2014   Past Surgical History:  Procedure Laterality Date   INGUINAL HERNIA REPAIR Left 01/29/2021   Procedure: LEFT INGUINAL HERNIA REPAIR WITH MESH;  Surgeon: Rolm Bookbinder, MD;  Location: [facility];  Service: General;  Laterality:  Left;   KNEE ARTHROSCOPY Right 01/31/2014   Procedure: RIGHT ARTHROSCOPY KNEE WITH DEBRIDMENT CHONDROPLASTY;  Surgeon: [name], MD;  Location: [facility];  Service: Orthopedics;  Laterality: Right;   LUMP REMOVED FROM LEFT BREAST Left 07/06/2006   STATES SURGERY  AT [facility] - LOCAL- NOT CANCER 5 OR MORE YRS AGO   MORTON'S NEUROMA REMOVED     TONSILLECTOMY     AS A CHILD   TRANSTHORACIC ECHOCARDIOGRAM  07/2020   Normal LV size and function.  GR 1 DD.  Mild RV enlargement.  Moderate LA & RA enlargement.  Mild MR.  MR likely related to mildly for MR/MVP.  Moderate TR, mildly elevated RAP.Marland Kitchen  Moderate ao valve thickening.  Mild AOV sclerosis but no stenosis.    Current Outpatient Medications  Medication Sig Dispense Refill   apixaban (ELIQUIS) 5 MG TABS tablet Take 1 tablet (5 mg total) by mouth 2 (two) times daily. 180 tablet 3   CALCIUM-MAGNESIUM-ZINC PO Take 1 tablet by mouth 2 (two) times daily.      cholecalciferol (VITAMIN D) 1000 UNITS tablet Take 1,000 Units by mouth daily.      Clobetasol Propionate (TEMOVATE) 0.05 % external spray Apply topically 2 (two) times daily. 59 mL 6   Cod Liver Oil CAPS      cyclobenzaprine (FLEXERIL) 10 MG tablet TAKE 1 TABLET BY  MOUTH DAILY AS  NEEDED FOR BACK MUSCLE SPASMS 90 tablet 1   famotidine (PEPCID) 20 MG tablet Take 1 tablet (20 mg total) by mouth daily. 90 tablet 3   hydroxychloroquine (PLAQUENIL) 200 MG tablet Take 200 mg by mouth 2 (two) times daily.     lisinopril-hydrochlorothiazide (ZESTORETIC) 10-12.5 MG tablet Take 1 tablet by mouth every morning. 90 tablet 3   Lutein-Zeaxanthin 25-5 MG CAPS Take by mouth.     misoprostol (CYTOTEC) 100 MCG tablet Take 100 mcg by mouth 2 (two) times daily. Pt taking as needed.     Multiple Vitamin (MULTIVITAMIN WITH MINERALS) TABS tablet Take 1 tablet by mouth daily.     pravastatin (PRAVACHOL) 20 MG tablet Take 1 tablet (20 mg total) by mouth daily. 90 tablet 3   triamcinolone cream (KENALOG) 0.1 % Apply 1  application topically daily. Apply to psoriasis rash (Patient taking differently: Apply 1 application  topically daily. Apply to psoriasis rash. Pt takes as needed.) 30 g 1   vitamin B-12 (CYANOCOBALAMIN) 1000 MCG tablet Take 1,000 mcg by mouth daily.     No current facility-administered medications for this encounter.    Allergies  Allergen Reactions   Azithromycin Diarrhea and Nausea And Vomiting   Latex     WHEN PT USED TO WEAR LATEX GLOVES AT WORK HE NOTICED SKIN REDNESS AND ITCHING OF HANDS.   Prednisone Other (See Comments)    "makes me angry and I feel weird."    Social History   Socioeconomic History   Marital status: Married    Spouse name: Not on file   Number of children: 0   Years of education: Not on file   Highest education level: Not on file  Occupational History   Not on file  Tobacco Use   Smoking status: Former    Packs/day: 0.25    Types: Cigarettes    Quit date: 61    Years since quitting: 34.0   Smokeless tobacco: Never  Vaping Use   Vaping Use: Never used  Substance and Sexual Activity   Alcohol use: Yes    Comment: QUIT SMOKING ABOUT 15 YRS AGO--WAS ONLY 2 OR 3 CIGARETTES WITH A BEER ON WEEK ENDS   Drug use: No   Sexual activity: Yes  Other Topics Concern   Not on file  Social History Narrative   Right handed, caffeine 2-3 cups daily, married, no kids, has baby bird.  WC back injury, so not working at this time.  BS in econmics.   Social Determinants of Health   Financial Resource Strain: Not on file  Food Insecurity: Not on file  Transportation Needs: Not on file  Physical Activity: Not on file  Stress: Not on file  Social Connections: Not on file  Intimate Partner Violence: Not on file    Family History  Problem Relation Age of Onset   Diabetes Mother    Hypertension Mother    Heart disease Mother        has a stent    Stroke Father 62   Hypertension Father     ROS- All systems are reviewed and negative except as per the HPI  above  Physical Exam: Vitals:   07/23/22 0934  Weight: 72.6 kg  Height: 5' 9.5" (1.765 m)   Wt Readings from Last 3 Encounters:  07/23/22 72.6 kg  07/10/22 72.6 kg  07/02/22 72.1 kg    Labs: Lab Results  Component Value Date   NA 141 02/27/2022  K 4.6 02/27/2022   CL 103 02/27/2022   CO2 31 02/27/2022   GLUCOSE 99 02/27/2022   BUN 18 02/27/2022   CREATININE 0.83 02/27/2022   CALCIUM 10.2 02/27/2022   No results found for: "INR" Lab Results  Component Value Date   CHOL 189 02/27/2022   HDL 71.70 02/27/2022   LDLCALC 102 (H) 02/27/2022   TRIG 73.0 02/27/2022     GEN- The patient is well appearing, alert and oriented x 3 today.   Head- normocephalic, atraumatic Eyes-  Sclera clear, conjunctiva pink Ears- hearing intact Oropharynx- clear Neck- supple, no JVP Lymph- no cervical lymphadenopathy Lungs- Clear to ausculation bilaterally, normal work of breathing Heart- irregular rate and rhythm, no murmurs, rubs or gallops, PMI not laterally displaced GI- soft, NT, ND, + BS Extremities- no clubbing, cyanosis, or edema MS- no significant deformity or atrophy Skin- no rash or lesion Psych- euthymic mood, full affect Neuro- strength and sensation are intact  EKG-Vent. rate 84 BPM PR interval * ms QRS duration 102 ms QT/QTcB 338/399 ms P-R-T axes * 112 -51 Atrial fibrillation with a competing junctional pacemaker Right axis deviation Cannot rule out Anterior infarct , age undetermined Abnormal ECG No previous ECGs available    Assessment and Plan:  1. PAF Persistent afib since last of December  He was offered short term of amiodarone with DCCV by Dr. Ellyn Hack and he declined I discussed options of rate control, cardioversion, AAT or ablation He is clear in his decision today that he wants to purse rate control as his strategy  and does not wish to pursue SR as he feels well.  He is rate controlled with a fairly soft BP so did not think rate control drugs are  needed  I reviewed with him s/s that he is not tolerating afig, exertional dyspnea, fluid retention/PND/orthopnea If he continues in afib , he will need an echo to see if there is any decline in his EF in a couple of months  Pt states that if this is seen he would consider a rhythm control strategy   2. CHA2DS2VASc  score of 2 Continue eliquis 5 mg bid to lessen risk of stroke   I have asked to move up Dr. Jacalyn Lefevre appointment in May to early April if possible Afib clinic as needed    Butch Penny C. Jashaun Penrose, [facility] [address] [place], [place] [address] [phone]     I discussed

## 2022-07-24 DIAGNOSIS — M25511 Pain in right shoulder: Secondary | ICD-10-CM | POA: Diagnosis not present

## 2022-07-24 DIAGNOSIS — M25512 Pain in left shoulder: Secondary | ICD-10-CM | POA: Diagnosis not present

## 2022-07-27 ENCOUNTER — Other Ambulatory Visit: Payer: Self-pay

## 2022-07-27 MED ORDER — MISOPROSTOL 100 MCG PO TABS: 100.0000 ug | ORAL_TABLET | Freq: Two times a day (BID) | ORAL | 3 refills | Status: DC

## 2022-07-31 DIAGNOSIS — M25512 Pain in left shoulder: Secondary | ICD-10-CM | POA: Diagnosis not present

## 2022-07-31 DIAGNOSIS — M25511 Pain in right shoulder: Secondary | ICD-10-CM | POA: Diagnosis not present

## 2022-07-31 DIAGNOSIS — M170 Bilateral primary osteoarthritis of knee: Secondary | ICD-10-CM | POA: Diagnosis not present

## 2022-08-13 ENCOUNTER — Encounter (HOSPITAL_COMMUNITY): Payer: Self-pay | Admitting: *Deleted

## 2022-08-14 DIAGNOSIS — M25511 Pain in right shoulder: Secondary | ICD-10-CM | POA: Diagnosis not present

## 2022-08-14 DIAGNOSIS — M25512 Pain in left shoulder: Secondary | ICD-10-CM | POA: Diagnosis not present

## 2022-08-20 ENCOUNTER — Encounter (HOSPITAL_BASED_OUTPATIENT_CLINIC_OR_DEPARTMENT_OTHER): Payer: Self-pay

## 2022-08-20 ENCOUNTER — Other Ambulatory Visit: Payer: Self-pay

## 2022-08-20 ENCOUNTER — Emergency Department (HOSPITAL_BASED_OUTPATIENT_CLINIC_OR_DEPARTMENT_OTHER)
Admission: EM | Admit: 2022-08-20 | Discharge: 2022-08-20 | Disposition: A | Discharge status: Home / Self Care | Payer: Medicare Other | Attending: Emergency Medicine | Admitting: Emergency Medicine

## 2022-08-20 ENCOUNTER — Emergency Department (HOSPITAL_BASED_OUTPATIENT_CLINIC_OR_DEPARTMENT_OTHER): Payer: Medicare Other

## 2022-08-20 DIAGNOSIS — I4891 Unspecified atrial fibrillation: Secondary | ICD-10-CM | POA: Insufficient documentation

## 2022-08-20 DIAGNOSIS — Z91040 Latex allergy status: Secondary | ICD-10-CM | POA: Diagnosis not present

## 2022-08-20 DIAGNOSIS — H35373 Puckering of macula, bilateral: Secondary | ICD-10-CM | POA: Diagnosis not present

## 2022-08-20 DIAGNOSIS — S0990XA Unspecified injury of head, initial encounter: Secondary | ICD-10-CM | POA: Diagnosis not present

## 2022-08-20 DIAGNOSIS — W228XXA Striking against or struck by other objects, initial encounter: Secondary | ICD-10-CM | POA: Insufficient documentation

## 2022-08-20 DIAGNOSIS — Y92002 Bathroom of unspecified non-institutional (private) residence single-family (private) house as the place of occurrence of the external cause: Secondary | ICD-10-CM | POA: Insufficient documentation

## 2022-08-20 DIAGNOSIS — Z7901 Long term (current) use of anticoagulants: Secondary | ICD-10-CM | POA: Insufficient documentation

## 2022-08-20 DIAGNOSIS — H2513 Age-related nuclear cataract, bilateral: Secondary | ICD-10-CM | POA: Diagnosis not present

## 2022-08-20 DIAGNOSIS — Z79899 Other long term (current) drug therapy: Secondary | ICD-10-CM | POA: Diagnosis not present

## 2022-08-20 DIAGNOSIS — H2511 Age-related nuclear cataract, right eye: Secondary | ICD-10-CM | POA: Diagnosis not present

## 2022-08-20 DIAGNOSIS — H18413 Arcus senilis, bilateral: Secondary | ICD-10-CM | POA: Diagnosis not present

## 2022-08-20 DIAGNOSIS — H25043 Posterior subcapsular polar age-related cataract, bilateral: Secondary | ICD-10-CM | POA: Diagnosis not present

## 2022-08-20 MED ORDER — ACETAMINOPHEN 500 MG PO TABS
500.0000 mg | ORAL_TABLET | Freq: Four times a day (QID) | ORAL | Status: DC | PRN
  Administered 2022-08-20: 500 mg via ORAL
  Filled 2022-08-20: qty 1

## 2022-08-20 NOTE — Discharge Instructions (Signed)
You may take another 500 mg tablet before bedtime--you can take 1000 mg every 6 hours as needed for pain.   The CT scan of your head is without any abnormal findings.  Return the emergency room for new or concerning symptoms such as slurred speech confusion facial droop or any other new or concerning symptoms.

## 2022-08-20 NOTE — ED Triage Notes (Signed)
Patient here POV from Home.  Endorses Head Injury at 1900 today. Patient was in the shower when he raised his head and struck it against the shower wall.  Takes Eliquis. No LOC.   NAD Noted during Triage. A&Ox4. GCS 15. Ambulatory.

## 2022-08-20 NOTE — ED Provider Notes (Signed)
[facility] Provider Note   CSN: [id number] Arrival date & time: 08/20/22  2154     History  Chief Complaint  Patient presents with   Head Injury    Richard [name] is a 77 y.o. male.   Head Injury Patient is a 77 year old gentleman on Eliquis for A-fib presented emergency room today after he struck his head at 7:00 PM this afternoon.  He states that he rates his head while it was underneath the soap holder in the shower and he struck his head on the corner he did not lose consciousness denies pains any nausea or vomiting.  Came to the ER with mild headache.  No other associated symptoms no blurry vision double vision nausea vomiting chest pain and shortness of breath disequilibrium numbness or weakness     Home Medications Prior to Admission medications   Medication Sig Start Date End Date Taking? Authorizing Provider  apixaban (ELIQUIS) 5 MG TABS tablet Take 1 tablet (5 mg total) by mouth 2 (two) times daily. 04/08/22   Hoyt Koch, MD  CALCIUM-MAGNESIUM-ZINC PO Take 1 tablet by mouth 2 (two) times daily.     [provider]  cholecalciferol (VITAMIN D) 1000 UNITS tablet Take 1,000 Units by mouth daily.     [provider]  Clobetasol Propionate (TEMOVATE) 0.05 % external spray Apply topically 2 (two) times daily. 10/27/21   Lavonna Monarch, MD  Cod Liver Oil CAPS Take 1 capsule by mouth every morning.    [provider]  cyclobenzaprine (FLEXERIL) 10 MG tablet TAKE 1 TABLET BY MOUTH DAILY AS  NEEDED FOR BACK MUSCLE SPASMS 07/20/22   Hoyt Koch, MD  famotidine (PEPCID) 20 MG tablet Take 1 tablet (20 mg total) by mouth daily. 07/17/22   Hoyt Koch, MD  hydroxychloroquine (PLAQUENIL) 200 MG tablet Take 200 mg by mouth 2 (two) times daily.    [provider]  lisinopril-hydrochlorothiazide (ZESTORETIC) 10-12.5 MG tablet Take 1 tablet by mouth every morning. 04/08/22   Hoyt Koch, MD  Lutein-Zeaxanthin 25-5 MG CAPS Take 1 capsule by mouth every morning.    [provider]  misoprostol (CYTOTEC) 100 MCG tablet Take 1 tablet (100 mcg total) by mouth 2 (two) times daily. Pt taking as needed. 07/27/22   Hoyt Koch, MD  Multiple Vitamin (MULTIVITAMIN WITH MINERALS) TABS tablet Take 1 tablet by mouth daily.    [provider]  pravastatin (PRAVACHOL) 20 MG tablet Take 1 tablet (20 mg total) by mouth daily. 04/08/22   Hoyt Koch, MD  triamcinolone cream (KENALOG) 0.1 % Apply 1 application topically daily. Apply to psoriasis rash Patient taking differently: Apply 1 application  topically daily. Apply to psoriasis rash. Pt takes as needed. 06/20/19   Trula Slade, DPM  vitamin B-12 (CYANOCOBALAMIN) 1000 MCG tablet Take 1,000 mcg by mouth daily.    [provider]      Allergies    Azithromycin, Latex, and Prednisone    Review of Systems   Review of Systems  Physical Exam Updated Vital Signs BP 110/79   Pulse 72   Temp 98 F (36.7 C) (Oral)   Resp 18   Ht 5' 8"$  (1.727 m)   Wt 69.4 kg   SpO2 99%   BMI 23.26 kg/m  Physical Exam Vitals and nursing note reviewed.  Constitutional:      General: He is not in acute distress.    Appearance: Normal appearance. He is  not ill-appearing.  HENT:     Head: Normocephalic and atraumatic.  Eyes:     General: No scleral icterus.       Right eye: No discharge.        Left eye: No discharge.     Conjunctiva/sclera: Conjunctivae normal.  Pulmonary:     Effort: Pulmonary effort is normal.     Breath sounds: No stridor.  Musculoskeletal:     Comments: Upper or lower extremity tenderness no C, T, L-spine tenderness no cranial tenderness  Neurological:     Mental Status: He is alert and oriented to person, place, and time. Mental status is at baseline.     Comments: Alert and oriented to self, place, time and event.   Speech is fluent, clear without dysarthria or  dysphasia.   Strength 5/5 in upper/lower extremities   Sensation intact in upper/lower extremities   Normal gait.  CN I not tested  CN II grossly intact visual fields bilaterally. Did not visualize posterior eye.  CN III, IV, VI PERRLA and EOMs intact bilaterally  CN V Intact sensation to sharp and light touch to the face  CN VII facial movements symmetric  CN VIII not tested  CN IX, X no uvula deviation, symmetric rise of soft palate  CN XI 5/5 SCM and trapezius strength bilaterally  CN XII Midline tongue protrusion, symmetric L/R movements      ED Results / Procedures / Treatments   Labs (all labs ordered are listed, but only abnormal results are displayed) Labs Reviewed - No data to display  EKG None  Radiology CT Head Wo Contrast  Result Date: 08/20/2022 CLINICAL DATA:  Head injury EXAM: CT HEAD WITHOUT CONTRAST TECHNIQUE: Contiguous axial images were obtained from the base of the skull through the vertex without intravenous contrast. RADIATION DOSE REDUCTION: This exam was performed according to the departmental dose-optimization program which includes automated exposure control, adjustment of the mA and/or kV according to patient size and/or use of iterative reconstruction technique. COMPARISON:  CT brain 03/12/2022 FINDINGS: Brain: No acute territorial infarction, hemorrhage or intracranial mass. The ventricles are nonenlarged. Minimal white matter hypodensity. Vascular: No hyperdense vessels.  No unexpected calcification Skull: Normal. Negative for fracture or focal lesion. Sinuses/Orbits: No acute finding. Other: None IMPRESSION: 1. No CT evidence for acute intracranial abnormality. 2. Minimal white matter disease. Electronically Signed   By: Donavan Foil M.D.   On: 08/20/2022 22:33    Procedures Procedures    Medications Ordered in ED Medications  acetaminophen (TYLENOL) tablet 500 mg (500 mg Oral Given 08/20/22 2247)    ED Course/ Medical Decision Making/ A&P                              Medical Decision Making Amount and/or Complexity of Data Reviewed Radiology: ordered.  Risk OTC drugs.   Patient is a 77 year old gentleman on Eliquis for A-fib presented emergency room today after he struck his head at 7:00 PM this afternoon.  He states that he rates his head while it was underneath the soap holder in the shower and he struck his head on the corner he did not lose consciousness denies pains any nausea or vomiting.  Came to the ER with mild headache.  No other associated symptoms no blurry vision double vision nausea vomiting chest pain and shortness of breath disequilibrium numbness or weakness \  Neurologic exam without abnormal findings  CT head without hemorrhage.  My team physician evaluated this person at bedside as well.  Given 1 dose of Tylenol feels mild improvement.  Recommended hydration and Tylenol at home and return to emergency room for any new or concerning symptoms.  Final Clinical Impression(s) / ED Diagnoses Final diagnoses:  Traumatic injury of head, initial encounter    Rx / DC Orders ED Discharge Orders     None         Tedd Sias, [place] 08/20/22 2356    Fransico Meadow, MD 08/24/22 1815

## 2022-08-24 ENCOUNTER — Other Ambulatory Visit (INDEPENDENT_AMBULATORY_CARE_PROVIDER_SITE_OTHER): Payer: Medicare Other

## 2022-08-24 ENCOUNTER — Other Ambulatory Visit: Payer: Medicare Other

## 2022-08-24 ENCOUNTER — Ambulatory Visit: Payer: Medicare Other | Admitting: Podiatry

## 2022-08-24 ENCOUNTER — Telehealth: Payer: Self-pay

## 2022-08-24 DIAGNOSIS — M79675 Pain in left toe(s): Secondary | ICD-10-CM | POA: Diagnosis not present

## 2022-08-24 DIAGNOSIS — M79674 Pain in right toe(s): Secondary | ICD-10-CM | POA: Diagnosis not present

## 2022-08-24 DIAGNOSIS — I10 Essential (primary) hypertension: Secondary | ICD-10-CM | POA: Diagnosis not present

## 2022-08-24 DIAGNOSIS — I480 Paroxysmal atrial fibrillation: Secondary | ICD-10-CM | POA: Diagnosis not present

## 2022-08-24 DIAGNOSIS — B351 Tinea unguium: Secondary | ICD-10-CM | POA: Diagnosis not present

## 2022-08-24 DIAGNOSIS — Z7901 Long term (current) use of anticoagulants: Secondary | ICD-10-CM

## 2022-08-24 DIAGNOSIS — R7301 Impaired fasting glucose: Secondary | ICD-10-CM | POA: Diagnosis not present

## 2022-08-24 LAB — TSH: TSH: 1.78 u[IU]/mL (ref 0.35–5.50)

## 2022-08-24 LAB — COMPREHENSIVE METABOLIC PANEL
ALT: 25 U/L (ref 0–53)
AST: 23 U/L (ref 0–37)
Albumin: 4.3 g/dL (ref 3.5–5.2)
Alkaline Phosphatase: 71 U/L (ref 39–117)
BUN: 17 mg/dL (ref 6–23)
CO2: 29 mEq/L (ref 19–32)
Calcium: 10 mg/dL (ref 8.4–10.5)
Chloride: 103 mEq/L (ref 96–112)
Creatinine, Ser: 0.9 mg/dL (ref 0.40–1.50)
GFR: 84.18 mL/min (ref >60.00)
Glucose, Bld: 85 mg/dL (ref 70–99)
Potassium: 4.1 mEq/L (ref 3.5–5.1)
Sodium: 141 mEq/L (ref 135–145)
Total Bilirubin: 0.9 mg/dL (ref 0.2–1.2)
Total Protein: 7.4 g/dL (ref 6.0–8.3)

## 2022-08-24 LAB — LIPID PANEL
Cholesterol: 168 mg/dL (ref 0–200)
HDL: 77.7 mg/dL (ref >39.00)
LDL Cholesterol: 80 mg/dL (ref 0–99)
NonHDL: 90.53
Total CHOL/HDL Ratio: 2
Triglycerides: 53 mg/dL (ref 0.0–149.0)
VLDL: 10.6 mg/dL (ref 0.0–40.0)

## 2022-08-24 LAB — CBC
HCT: 42.1 % (ref 39.0–52.0)
Hemoglobin: 13.7 g/dL (ref 13.0–17.0)
MCHC: 32.6 g/dL (ref 30.0–36.0)
MCV: 86.5 fl (ref 78.0–100.0)
Platelets: 185 10*3/uL (ref 150.0–400.0)
RBC: 4.87 Mil/uL (ref 4.22–5.81)
RDW: 15.1 % (ref 11.5–15.5)
WBC: 5.2 10*3/uL (ref 4.0–10.5)

## 2022-08-24 LAB — URINALYSIS, ROUTINE W REFLEX MICROSCOPIC
Bilirubin Urine: NEGATIVE
Hgb urine dipstick: NEGATIVE
Ketones, ur: NEGATIVE
Leukocytes,Ua: NEGATIVE
Nitrite: NEGATIVE
RBC / HPF: NONE SEEN (ref 0–?)
Specific Gravity, Urine: 1.025 (ref 1.000–1.030)
Urine Glucose: NEGATIVE
Urobilinogen, UA: 0.2 (ref 0.0–1.0)
pH: 6.5 (ref 5.0–8.0)

## 2022-08-24 LAB — MICROALBUMIN / CREATININE URINE RATIO
Creatinine,U: 180.4 mg/dL
Microalb Creat Ratio: 2.4 mg/g (ref 0.0–30.0)
Microalb, Ur: 4.3 mg/dL — ABNORMAL HIGH (ref 0.0–1.9)

## 2022-08-24 LAB — HEMOGLOBIN A1C: Hgb A1c MFr Bld: 6.1 % (ref 4.6–6.5)

## 2022-08-24 NOTE — Telephone Encounter (Signed)
   Pre-operative Risk Assessment    Patient Name: Richard [name]  DOB: [birth year] MRN: [id number]      Request for Surgical Clearance    Procedure:  CATARACT EXTRACTION W/INTRAOCULAR LENS IMPLANTATION RIGHT EYE FOLLOWED BY THE LEFT EYE  Date of Surgery:  Clearance TBD                                 Surgeon:  DR. Talbert Forest / DR. Nancy Fetter Surgeon's Group or Practice Name: [facility] Phone number:  [phone] Fax number:  [phone]   Type of Clearance Requested:   - Pharmacy:  Hold Apixaban (Eliquis) INSTRUCTION  IF NEEDED TO HOLD   Type of Anesthesia:  Not Indicated   Additional requests/questions:    SignedJacinta Shoe   08/24/2022, 4:52 PM

## 2022-08-25 NOTE — Telephone Encounter (Signed)
   Patient Name: Davarian Lohse  DOB: June 25, 1946 MRN: [id number]  Primary Cardiologist: Kirk Ruths, MD  Chart reviewed as part of pre-operative protocol coverage. Cataract extractions are recognized in guidelines as low risk surgeries that do not typically require specific preoperative testing or holding of blood thinner therapy. Therefore, given past medical history and time since last visit, based on ACC/AHA guidelines, Ove Amat would be at acceptable risk for the planned procedure without further cardiovascular testing.   I will route this recommendation to the requesting party via Epic fax function and remove from pre-op pool.  Please call with questions.  Mayra Reel, NP 08/25/2022, 8:15 AM

## 2022-08-26 NOTE — Progress Notes (Signed)
Subjective: Chief Complaint  Patient presents with   Nail Problem    Thick painful toenails, 9 week follow up     77 y.o. returns the office today for painful, elongated, thickened toenails which he cannot trim himself. Denies any redness or drainage around the nails.  No open lesions.  No other concerns today.    PCP: Merrilee Seashore, MD  He is on Eliquis for A-fib  Objective: AAO 3, NAD DP/PT pulses palpable, CRT less than 3 seconds Nails hypertrophic, dystrophic, elongated, brittle, discolored 10. There is tenderness overlying the nails 1-5 bilaterally. There is no surrounding erythema or drainage along the nail sites.  No open lesions or pre-ulcerative lesions are identified. No pain with calf compression, swelling, warmth, erythema. Stable overall   Assessment: Patient presents with symptomatic onychomycosis, on Eliquis  Plan: -Treatment options including alternatives, risks, complications were discussed -Nails sharply debrided 10 without complication/bleeding. -Discussed daily foot inspection. If there are any changes, to call the office immediately.  -Follow-up as scheduled or sooner if any problems are to arise. In the meantime, encouraged to call the office with any questions, concerns, changes symptoms.  Return in about 9 weeks (around 10/26/2022).  Trula Slade DPM

## 2022-08-28 ENCOUNTER — Ambulatory Visit (INDEPENDENT_AMBULATORY_CARE_PROVIDER_SITE_OTHER): Payer: Medicare Other | Admitting: Internal Medicine

## 2022-08-28 ENCOUNTER — Encounter: Payer: Self-pay | Admitting: Internal Medicine

## 2022-08-28 ENCOUNTER — Ambulatory Visit (INDEPENDENT_AMBULATORY_CARE_PROVIDER_SITE_OTHER): Payer: Medicare Other

## 2022-08-28 VITALS — BP 100/68 | HR 85 | Temp 97.5°F | Ht 68.0 in | Wt 153.0 lb

## 2022-08-28 DIAGNOSIS — I83019 Varicose veins of right lower extremity with ulcer of unspecified site: Secondary | ICD-10-CM | POA: Diagnosis not present

## 2022-08-28 DIAGNOSIS — L97919 Non-pressure chronic ulcer of unspecified part of right lower leg with unspecified severity: Secondary | ICD-10-CM

## 2022-08-28 DIAGNOSIS — R7301 Impaired fasting glucose: Secondary | ICD-10-CM

## 2022-08-28 DIAGNOSIS — L4050 Arthropathic psoriasis, unspecified: Secondary | ICD-10-CM

## 2022-08-28 DIAGNOSIS — R42 Dizziness and giddiness: Secondary | ICD-10-CM | POA: Diagnosis not present

## 2022-08-28 DIAGNOSIS — E782 Mixed hyperlipidemia: Secondary | ICD-10-CM

## 2022-08-28 DIAGNOSIS — R053 Chronic cough: Secondary | ICD-10-CM

## 2022-08-28 DIAGNOSIS — Z0000 Encounter for general adult medical examination without abnormal findings: Secondary | ICD-10-CM | POA: Diagnosis not present

## 2022-08-28 DIAGNOSIS — I872 Venous insufficiency (chronic) (peripheral): Secondary | ICD-10-CM | POA: Insufficient documentation

## 2022-08-28 MED ORDER — FLUTICASONE PROPIONATE 50 MCG/ACT NA SUSP: 2.0000 | Freq: Every day | NASAL | 3 refills | Status: DC

## 2022-08-28 NOTE — Assessment & Plan Note (Signed)
HgA1c stable at 6.1 and using diet to help.

## 2022-08-28 NOTE — Assessment & Plan Note (Signed)
Taking plaquenil and gets yearly eye exam. Reviewed CBC, CMP.

## 2022-08-28 NOTE — Assessment & Plan Note (Signed)
Referral to vascular surgery  

## 2022-08-28 NOTE — Assessment & Plan Note (Signed)
Checking CXR and rx flonase to help likely post nasal drip.

## 2022-08-28 NOTE — Progress Notes (Signed)
   Subjective:   Patient ID: Richard [name], male    DOB: [birth year], 77 y.o.   MRN: [id number]  HPI The patient is here for physical.  PMH, [facility], social history reviewed and updated  Review of Systems  Constitutional: Negative.   HENT: Negative.    Eyes: Negative.   Respiratory:  Positive for cough. Negative for chest tightness and shortness of breath.   Cardiovascular:  Negative for chest pain, palpitations and leg swelling.  Gastrointestinal:  Negative for abdominal distention, abdominal pain, constipation, diarrhea, nausea and vomiting.  Musculoskeletal: Negative.   Skin:  Positive for wound.  Neurological: Negative.   Psychiatric/Behavioral: Negative.      Objective:  Physical Exam Constitutional:      Appearance: He is well-developed.  HENT:     Head: Normocephalic and atraumatic.  Cardiovascular:     Rate and Rhythm: Normal rate and regular rhythm.  Pulmonary:     Effort: Pulmonary effort is normal. No respiratory distress.     Breath sounds: Normal breath sounds. No wheezing or rales.  Abdominal:     General: Bowel sounds are normal. There is no distension.     Palpations: Abdomen is soft.     Tenderness: There is no abdominal tenderness. There is no rebound.  Musculoskeletal:     Cervical back: Normal range of motion.  Skin:    General: Skin is warm and dry.     Comments: Varicose vein right leg with ulcer  Neurological:     Mental Status: He is alert and oriented to person, place, and time.     Coordination: Coordination normal.     Vitals:   08/28/22 1105  BP: 100/68  Pulse: 85  Temp: (!) 97.5 F (36.4 C)  TempSrc: Oral  SpO2: 99%  Weight: 153 lb (69.4 kg)  Height: 5' 8"$  (1.727 m)    Assessment & Plan:

## 2022-08-28 NOTE — Patient Instructions (Signed)
We will get you in with the vein doctor.   We will do the vestibular therapy.  We will do the chest x-ray today.

## 2022-08-28 NOTE — Assessment & Plan Note (Signed)
Reviewed recent lipid panel. Continue pravastatin 20 mg daily.

## 2022-08-28 NOTE — Assessment & Plan Note (Signed)
Referral to PT for vestibular therapy.

## 2022-08-28 NOTE — Assessment & Plan Note (Addendum)
Flu shot up to date. Covid-19 counseled. Pneumonia complete. Shingrix complete. Tetanus up to date. Colonoscopy up to date. Counseled about sun safety and mole surveillance. Counseled about the dangers of distracted driving. Given 10 year screening recommendations.

## 2022-09-03 DIAGNOSIS — H2513 Age-related nuclear cataract, bilateral: Secondary | ICD-10-CM | POA: Diagnosis not present

## 2022-09-03 DIAGNOSIS — H35373 Puckering of macula, bilateral: Secondary | ICD-10-CM | POA: Diagnosis not present

## 2022-09-03 DIAGNOSIS — H43813 Vitreous degeneration, bilateral: Secondary | ICD-10-CM | POA: Diagnosis not present

## 2022-09-04 DIAGNOSIS — M25511 Pain in right shoulder: Secondary | ICD-10-CM | POA: Diagnosis not present

## 2022-09-04 DIAGNOSIS — M25512 Pain in left shoulder: Secondary | ICD-10-CM | POA: Diagnosis not present

## 2022-09-07 ENCOUNTER — Encounter: Payer: Self-pay | Admitting: Vascular Surgery

## 2022-09-07 ENCOUNTER — Ambulatory Visit: Payer: Medicare Other | Admitting: Vascular Surgery

## 2022-09-07 VITALS — BP 103/68 | HR 87 | Temp 98.2°F | Resp 16 | Ht 70.0 in | Wt 157.0 lb

## 2022-09-07 DIAGNOSIS — I872 Venous insufficiency (chronic) (peripheral): Secondary | ICD-10-CM | POA: Diagnosis not present

## 2022-09-07 DIAGNOSIS — I8393 Asymptomatic varicose veins of bilateral lower extremities: Secondary | ICD-10-CM
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
## 2022-09-09 DIAGNOSIS — M25511 Pain in right shoulder: Secondary | ICD-10-CM | POA: Diagnosis not present

## 2022-09-09 DIAGNOSIS — M25512 Pain in left shoulder: Secondary | ICD-10-CM | POA: Diagnosis not present

## 2022-09-11 ENCOUNTER — Encounter (HOSPITAL_BASED_OUTPATIENT_CLINIC_OR_DEPARTMENT_OTHER): Payer: Self-pay

## 2022-09-11 ENCOUNTER — Emergency Department (HOSPITAL_BASED_OUTPATIENT_CLINIC_OR_DEPARTMENT_OTHER)
Admission: EM | Admit: 2022-09-11 | Discharge: 2022-09-11 | Disposition: A | Discharge status: Home / Self Care | Payer: Medicare Other | Attending: Emergency Medicine | Admitting: Emergency Medicine

## 2022-09-11 ENCOUNTER — Other Ambulatory Visit: Payer: Self-pay

## 2022-09-11 ENCOUNTER — Emergency Department (HOSPITAL_BASED_OUTPATIENT_CLINIC_OR_DEPARTMENT_OTHER): Payer: Medicare Other

## 2022-09-11 DIAGNOSIS — I10 Essential (primary) hypertension: Secondary | ICD-10-CM | POA: Insufficient documentation

## 2022-09-11 DIAGNOSIS — Z79899 Other long term (current) drug therapy: Secondary | ICD-10-CM | POA: Diagnosis not present

## 2022-09-11 DIAGNOSIS — Z7901 Long term (current) use of anticoagulants: Secondary | ICD-10-CM | POA: Insufficient documentation

## 2022-09-11 DIAGNOSIS — Z91040 Latex allergy status: Secondary | ICD-10-CM | POA: Insufficient documentation

## 2022-09-11 DIAGNOSIS — S0990XA Unspecified injury of head, initial encounter: Secondary | ICD-10-CM | POA: Diagnosis not present

## 2022-09-11 DIAGNOSIS — W228XXA Striking against or struck by other objects, initial encounter: Secondary | ICD-10-CM | POA: Diagnosis not present
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
## 2022-09-11 NOTE — Discharge Instructions (Signed)
CT head negative no evidence of any acute injuries.

## 2022-09-11 NOTE — ED Provider Notes (Addendum)
[facility] Provider Note   CSN: [id number] Arrival date & time: 09/11/22  2000     History  Chief Complaint  Patient presents with   Head Injury    Richard [name] is a 77 y.o. male.  Patient struck his head on a door at approximately 1600.  No loss of consciousness.  Hit the left side of his head.  Patient is on Eliquis for atrial fibrillation.  Chart review shows the patient was seen February 15 for head injury as well.  Patient without any current symptoms.  No neck pain no back pain no other injuries.  Denies any numbness or weakness.  No visual changes no speech problems.  Past medical history significant atrial fibrillation gastroesophageal reflux disease and hypertension.       Home Medications Prior to Admission medications   Medication Sig Start Date End Date Taking? Authorizing Provider  apixaban (ELIQUIS) 5 MG TABS tablet Take 1 tablet (5 mg total) by mouth 2 (two) times daily. 04/08/22   Hoyt Koch, MD  CALCIUM-MAGNESIUM-ZINC PO Take 1 tablet by mouth 2 (two) times daily.     [provider]  cholecalciferol (VITAMIN D) 1000 UNITS tablet Take 1,000 Units by mouth daily.     [provider]  Clobetasol Propionate (TEMOVATE) 0.05 % external spray Apply topically 2 (two) times daily. 10/27/21   Lavonna Monarch, MD  Cod Liver Oil CAPS Take 1 capsule by mouth every morning.    [provider]  cyclobenzaprine (FLEXERIL) 10 MG tablet TAKE 1 TABLET BY MOUTH DAILY AS  NEEDED FOR BACK MUSCLE SPASMS 07/20/22   Hoyt Koch, MD  famotidine (PEPCID) 20 MG tablet Take 1 tablet (20 mg total) by mouth daily. 07/17/22   Hoyt Koch, MD  fluticasone Asencion Islam) 50 MCG/ACT nasal spray Place 2 sprays into both nostrils daily. 08/28/22   Hoyt Koch, MD  hydroxychloroquine (PLAQUENIL) 200 MG tablet Take 200 mg by mouth 2 (two) times daily.    [provider]   lisinopril-hydrochlorothiazide (ZESTORETIC) 10-12.5 MG tablet Take 1 tablet by mouth every morning. 04/08/22   Hoyt Koch, MD  Lutein-Zeaxanthin 25-5 MG CAPS Take 1 capsule by mouth every morning.    [provider]  misoprostol (CYTOTEC) 100 MCG tablet Take 1 tablet (100 mcg total) by mouth 2 (two) times daily. Pt taking as needed. 07/27/22   Hoyt Koch, MD  Multiple Vitamin (MULTIVITAMIN WITH MINERALS) TABS tablet Take 1 tablet by mouth daily.    [provider]  pravastatin (PRAVACHOL) 20 MG tablet Take 1 tablet (20 mg total) by mouth daily. 04/08/22   Hoyt Koch, MD  triamcinolone cream (KENALOG) 0.1 % Apply 1 application topically daily. Apply to psoriasis rash Patient taking differently: Apply 1 application  topically daily. Apply to psoriasis rash. Pt takes as needed. 06/20/19   Trula Slade, DPM  vitamin B-12 (CYANOCOBALAMIN) 1000 MCG tablet Take 1,000 mcg by mouth daily.    [provider]      Allergies    Azithromycin, Latex, and Prednisone    Review of Systems   Review of Systems  Constitutional:  Negative for chills and fever.  HENT:  Negative for ear pain and sore throat.   Eyes:  Negative for pain and visual disturbance.  Respiratory:  Negative for cough and shortness of breath.   Cardiovascular:  Negative for chest pain and palpitations.  Gastrointestinal:  Negative for abdominal pain and vomiting.  Genitourinary:  Negative for dysuria and hematuria.  Musculoskeletal:  Negative for arthralgias and back pain.  Skin:  Negative for color change and rash.  Neurological:  Negative for seizures and syncope.  All other systems reviewed and are negative.   Physical Exam Updated Vital Signs BP (!) 135/94 (BP Location: Left Arm)   Pulse 80   Temp 98.1 F (36.7 C) (Oral)   Resp 18   Ht 1.778 m ('5\' 10"'$ )   Wt 71.2 kg   SpO2 100%   BMI 22.52 kg/m  Physical Exam Vitals and nursing note reviewed.   Constitutional:      General: He is not in acute distress.    Appearance: Normal appearance. He is well-developed.  HENT:     Head: Normocephalic and atraumatic.     Comments: No evidence of any trauma or bruising. Eyes:     Extraocular Movements: Extraocular movements intact.     Conjunctiva/sclera: Conjunctivae normal.     Pupils: Pupils are equal, round, and reactive to light.  Cardiovascular:     Rate and Rhythm: Normal rate and regular rhythm.     Heart sounds: No murmur heard. Pulmonary:     Effort: Pulmonary effort is normal. No respiratory distress.     Breath sounds: Normal breath sounds.  Abdominal:     Palpations: Abdomen is soft.     Tenderness: There is no abdominal tenderness.  Musculoskeletal:        General: No swelling.     Cervical back: Neck supple.  Skin:    General: Skin is warm and dry.     Capillary Refill: Capillary refill takes less than 2 seconds.  Neurological:     General: No focal deficit present.     Mental Status: He is alert and oriented to person, place, and time.     Cranial Nerves: No cranial nerve deficit.     Sensory: No sensory deficit.     Motor: No weakness.  Psychiatric:        Mood and Affect: Mood normal.     ED Results / Procedures / Treatments   Labs (all labs ordered are listed, but only abnormal results are displayed) Labs Reviewed - No data to display  EKG None  Radiology No results found.  Procedures Procedures    Medications Ordered in ED Medications - No data to display  ED Course/ Medical Decision Making/ A&P                             Medical Decision Making Amount and/or Complexity of Data Reviewed Radiology: ordered.   CT head pending.  Patient without any significant injuries or any significant findings on neuroexam.  If head CT negative patient stable for discharge home.  CT negative no acute findings.  Final Clinical Impression(s) / ED Diagnoses Final diagnoses:  Injury of head, initial  encounter    Rx / DC Orders ED Discharge Orders     None         Fredia Sorrow, MD 09/11/22 2055    Fredia Sorrow, MD 09/11/22 2055    Fredia Sorrow, MD 09/11/22 2125

## 2022-09-13 ENCOUNTER — Emergency Department (HOSPITAL_BASED_OUTPATIENT_CLINIC_OR_DEPARTMENT_OTHER)
Admission: EM | Admit: 2022-09-13 | Discharge: 2022-09-13 | Disposition: A | Discharge status: Home / Self Care | Payer: Medicare Other | Attending: Emergency Medicine | Admitting: Emergency Medicine

## 2022-09-13 ENCOUNTER — Emergency Department (HOSPITAL_BASED_OUTPATIENT_CLINIC_OR_DEPARTMENT_OTHER): Payer: Medicare Other

## 2022-09-13 ENCOUNTER — Encounter (HOSPITAL_BASED_OUTPATIENT_CLINIC_OR_DEPARTMENT_OTHER): Payer: Self-pay | Admitting: Emergency Medicine

## 2022-09-13 ENCOUNTER — Other Ambulatory Visit: Payer: Self-pay

## 2022-09-13 DIAGNOSIS — Z7901 Long term (current) use of anticoagulants: Secondary | ICD-10-CM | POA: Insufficient documentation

## 2022-09-13 DIAGNOSIS — W228XXA Striking against or struck by other objects, initial encounter: Secondary | ICD-10-CM | POA: Diagnosis not present

## 2022-09-13 DIAGNOSIS — S0003XA Contusion of scalp, initial encounter: Secondary | ICD-10-CM | POA: Diagnosis not present

## 2022-09-13 DIAGNOSIS — I10 Essential (primary) hypertension: Secondary | ICD-10-CM | POA: Insufficient documentation

## 2022-09-13 DIAGNOSIS — I4891 Unspecified atrial fibrillation: Secondary | ICD-10-CM | POA: Diagnosis not present

## 2022-09-13 DIAGNOSIS — Z91040 Latex allergy status: Secondary | ICD-10-CM | POA: Diagnosis not present

## 2022-09-13 DIAGNOSIS — Z79899 Other long term (current) drug therapy: Secondary | ICD-10-CM | POA: Insufficient documentation

## 2022-09-13 DIAGNOSIS — S0990XA Unspecified injury of head, initial encounter: Secondary | ICD-10-CM | POA: Diagnosis not present

## 2022-09-13 NOTE — Discharge Instructions (Addendum)
CAT scan was normal today.  Use Tylenol as needed for headache.

## 2022-09-13 NOTE — ED Provider Notes (Signed)
[facility] Provider Note   CSN: [id number] Arrival date & time: 09/13/22  1547     History  Chief Complaint  Patient presents with   Head Injury    Richard [name] is a 77 y.o. male.  Patient is a 77 year old male with a history of hypertension, A-fib on Eliquis who is presenting today due to her persistent headache.  Patient reports that yesterday he stood up and misjudged where the top of the door was for his animals crate and hit the back of his head.  He reports it did cause him to have a headache but it did not cause loss of consciousness or him to feel days.  He reports normally he would not have done anything but he was still having a headache today and that is when he became worried.  He has not had any difficulty walking, unilateral numbness or weakness in his arms or legs, confusion or vomiting.  He did feel slightly nauseated this morning.  The history is provided by the patient.  Head Injury      Home Medications Prior to Admission medications   Medication Sig Start Date End Date Taking? Authorizing Provider  apixaban (ELIQUIS) 5 MG TABS tablet Take 1 tablet (5 mg total) by mouth 2 (two) times daily. 04/08/22   Hoyt Koch, MD  CALCIUM-MAGNESIUM-ZINC PO Take 1 tablet by mouth 2 (two) times daily.     [provider]  cholecalciferol (VITAMIN D) 1000 UNITS tablet Take 1,000 Units by mouth daily.     [provider]  Clobetasol Propionate (TEMOVATE) 0.05 % external spray Apply topically 2 (two) times daily. 10/27/21   Lavonna Monarch, MD  Cod Liver Oil CAPS Take 1 capsule by mouth every morning.    [provider]  cyclobenzaprine (FLEXERIL) 10 MG tablet TAKE 1 TABLET BY MOUTH DAILY AS  NEEDED FOR BACK MUSCLE SPASMS 07/20/22   Hoyt Koch, MD  famotidine (PEPCID) 20 MG tablet Take 1 tablet (20 mg total) by mouth daily. 07/17/22   Hoyt Koch, MD  fluticasone Asencion Islam) 50  MCG/ACT nasal spray Place 2 sprays into both nostrils daily. 08/28/22   Hoyt Koch, MD  hydroxychloroquine (PLAQUENIL) 200 MG tablet Take 200 mg by mouth 2 (two) times daily.    [provider]  lisinopril-hydrochlorothiazide (ZESTORETIC) 10-12.5 MG tablet Take 1 tablet by mouth every morning. 04/08/22   Hoyt Koch, MD  Lutein-Zeaxanthin 25-5 MG CAPS Take 1 capsule by mouth every morning.    [provider]  misoprostol (CYTOTEC) 100 MCG tablet Take 1 tablet (100 mcg total) by mouth 2 (two) times daily. Pt taking as needed. 07/27/22   Hoyt Koch, MD  Multiple Vitamin (MULTIVITAMIN WITH MINERALS) TABS tablet Take 1 tablet by mouth daily.    [provider]  pravastatin (PRAVACHOL) 20 MG tablet Take 1 tablet (20 mg total) by mouth daily. 04/08/22   Hoyt Koch, MD  triamcinolone cream (KENALOG) 0.1 % Apply 1 application topically daily. Apply to psoriasis rash Patient taking differently: Apply 1 application  topically daily. Apply to psoriasis rash. Pt takes as needed. 06/20/19   Trula Slade, DPM  vitamin B-12 (CYANOCOBALAMIN) 1000 MCG tablet Take 1,000 mcg by mouth daily.    [provider]      Allergies    Azithromycin, Latex, and Prednisone    Review of Systems   Review of Systems  Physical Exam Updated Vital Signs BP 110/74  Pulse 84   Temp 97.9 F (36.6 C) (Oral)   Resp 20   SpO2 100%  Physical Exam Vitals and nursing note reviewed.  Constitutional:      General: He is not in acute distress.    Appearance: He is well-developed.  HENT:     Head: Normocephalic and atraumatic.  Eyes:     Conjunctiva/sclera: Conjunctivae normal.     Pupils: Pupils are equal, round, and reactive to light.  Cardiovascular:     Rate and Rhythm: Normal rate and regular rhythm.     Heart sounds: No murmur heard. Pulmonary:     Effort: Pulmonary effort is normal. No respiratory distress.     Breath sounds: Normal  breath sounds. No wheezing or rales.  Abdominal:     General: There is no distension.     Palpations: Abdomen is soft.     Tenderness: There is no abdominal tenderness. There is no guarding or rebound.  Musculoskeletal:        General: No tenderness. Normal range of motion.     Cervical back: Normal range of motion and neck supple. No tenderness.  Skin:    General: Skin is warm and dry.     Findings: No erythema or rash.  Neurological:     Mental Status: He is alert and oriented to person, place, and time.     Sensory: No sensory deficit.     Motor: No weakness.     Gait: Gait normal.  Psychiatric:        Behavior: Behavior normal.     ED Results / Procedures / Treatments   Labs (all labs ordered are listed, but only abnormal results are displayed) Labs Reviewed - No data to display  EKG None  Radiology CT Head Wo Contrast  Result Date: 09/13/2022 CLINICAL DATA:  Head injury. EXAM: CT HEAD WITHOUT CONTRAST TECHNIQUE: Contiguous axial images were obtained from the base of the skull through the vertex without intravenous contrast. RADIATION DOSE REDUCTION: This exam was performed according to the departmental dose-optimization program which includes automated exposure control, adjustment of the mA and/or kV according to patient size and/or use of iterative reconstruction technique. COMPARISON:  09/11/2022 FINDINGS: Brain: There is no evidence for acute hemorrhage, hydrocephalus, mass lesion, or abnormal extra-axial fluid collection. No definite CT evidence for acute infarction. Patchy low attenuation in the deep hemispheric and periventricular white matter is nonspecific, but likely reflects chronic microvascular ischemic demyelination. Vascular: No hyperdense vessel or unexpected calcification. Skull: No evidence for fracture. No worrisome lytic or sclerotic lesion. Sinuses/Orbits: No acute finding. Other: None. IMPRESSION: 1. No acute intracranial abnormality. 2. Chronic small vessel  ischemic disease. Electronically Signed   By: Misty Stanley M.D.   On: 09/13/2022 16:58   CT Head Wo Contrast  Result Date: 09/11/2022 CLINICAL DATA:  Trauma EXAM: CT HEAD WITHOUT CONTRAST TECHNIQUE: Contiguous axial images were obtained from the base of the skull through the vertex without intravenous contrast. RADIATION DOSE REDUCTION: This exam was performed according to the departmental dose-optimization program which includes automated exposure control, adjustment of the mA and/or kV according to patient size and/or use of iterative reconstruction technique. COMPARISON:  Head CT 08/20/2022 FINDINGS: Brain: No evidence of acute infarction, hemorrhage, hydrocephalus, extra-axial collection or mass lesion/mass effect. Vascular: No hyperdense vessel or unexpected calcification. Skull: Normal. Negative for fracture or focal lesion. Sinuses/Orbits: No acute finding. Other: None. IMPRESSION: No acute intracranial pathology. Electronically Signed   By: Ronney Asters M.D.   On:  09/11/2022 21:16    Procedures Procedures    Medications Ordered in ED Medications - No data to display  ED Course/ Medical Decision Making/ A&P                             Medical Decision Making Amount and/or Complexity of Data Reviewed Radiology: ordered and independent interpretation performed. Decision-making details documented in ED Course.   Pt with multiple medical problems and comorbidities and presenting today with a complaint that caries a high risk for morbidity and mortality.  Here today with headache after head injury yesterday on Eliquis.  Patient is neurovascularly intact at this time and well-appearing.  I have independently visualized and interpreted pt's images today. CT head neg. patient is cleared for discharge.         Final Clinical Impression(s) / ED Diagnoses Final diagnoses:  Contusion of scalp, initial encounter    Rx / DC Orders ED Discharge Orders     None         Blanchie Dessert, MD 09/13/22 1724

## 2022-09-13 NOTE — ED Notes (Signed)
Patient given discharge instructions. Questions were answered. Patient verbalized understanding of discharge instructions and care at home. ? ?Discharged with family ?

## 2022-09-13 NOTE — ED Triage Notes (Signed)
Hit his head on top of birdcage (metal) when bending down and standing up . Pt is on eliquis, happened yesterday afternoon, has headache, has progressively worsened.

## 2022-09-16 DIAGNOSIS — M170 Bilateral primary osteoarthritis of knee: Secondary | ICD-10-CM | POA: Diagnosis not present

## 2022-09-21 DIAGNOSIS — M25511 Pain in right shoulder: Secondary | ICD-10-CM | POA: Diagnosis not present

## 2022-09-21 DIAGNOSIS — M25512 Pain in left shoulder: Secondary | ICD-10-CM | POA: Diagnosis not present

## 2022-09-22 ENCOUNTER — Telehealth: Payer: Self-pay

## 2022-09-22 ENCOUNTER — Other Ambulatory Visit: Payer: Self-pay

## 2022-09-22 ENCOUNTER — Emergency Department (HOSPITAL_BASED_OUTPATIENT_CLINIC_OR_DEPARTMENT_OTHER): Payer: Medicare Other

## 2022-09-22 ENCOUNTER — Emergency Department (HOSPITAL_BASED_OUTPATIENT_CLINIC_OR_DEPARTMENT_OTHER)
Admission: EM | Admit: 2022-09-22 | Discharge: 2022-09-22 | Disposition: A | Discharge status: Home / Self Care | Payer: Medicare Other | Attending: Emergency Medicine | Admitting: Emergency Medicine

## 2022-09-22 DIAGNOSIS — Z7901 Long term (current) use of anticoagulants: Secondary | ICD-10-CM | POA: Insufficient documentation

## 2022-09-22 DIAGNOSIS — Z91040 Latex allergy status: Secondary | ICD-10-CM | POA: Insufficient documentation

## 2022-09-22 DIAGNOSIS — W228XXA Striking against or struck by other objects, initial encounter: Secondary | ICD-10-CM | POA: Insufficient documentation

## 2022-09-22 DIAGNOSIS — S0990XA Unspecified injury of head, initial encounter: Secondary | ICD-10-CM

## 2022-09-22 DIAGNOSIS — R519 Headache, unspecified: Secondary | ICD-10-CM | POA: Diagnosis not present

## 2022-09-22 NOTE — ED Notes (Addendum)
error 

## 2022-09-22 NOTE — ED Triage Notes (Signed)
Reports he was playing with his birds and hit his head on the side of his metal door frame. Pt is on Eliquis. +headache.  Denies LOC. Respirations are equal and nonlabored. Skin warm and dry.

## 2022-09-22 NOTE — Discharge Instructions (Signed)
Get help right away if: You have: A severe headache that is not helped by medicine. Trouble walking or weakness in your arms and legs. Clear or bloody fluid coming from your nose or ears. Changes in your vision. A seizure. Increased confusion or irritability. Your symptoms get worse. You are sleepier than normal and have trouble staying awake. You lose your balance. Your pupils change size. Your speech is slurred. Your dizziness gets worse. You vomit. These symptoms may represent a serious problem that is an emergency. Do not wait to see if the symptoms will go away. Get medical help right away. Call your local emergency services (911 in the [place] Do not drive yourself to the hospital. 

## 2022-09-22 NOTE — ED Notes (Signed)
Ambulatory to room then restroom, steady gait. No assistance needed

## 2022-09-22 NOTE — ED Provider Notes (Signed)
[facility] Provider Note   CSN: [id number] Arrival date & time: 09/22/22  1946     History {Add pertinent medical, surgical, social history, OB history to HPI:1} Chief Complaint  Patient presents with  . Head Injury    Richard [name] is a 77 y.o. male who presents emergency department chief complaint of head injury.  He is on Eliquis.  Patient was cleaning out his birdcage when he stood up and hit his head against the open cage door.  He did not lose consciousness but had a small headache.  He denies any obvious hematomas.  He came for evaluation due to injuring his head and being on a blood thinner.   Head Injury      Home Medications Prior to Admission medications   Medication Sig Start Date End Date Taking? Authorizing Provider  apixaban (ELIQUIS) 5 MG TABS tablet Take 1 tablet (5 mg total) by mouth 2 (two) times daily. 04/08/22   Hoyt Koch, MD  CALCIUM-MAGNESIUM-ZINC PO Take 1 tablet by mouth 2 (two) times daily.     [provider]  cholecalciferol (VITAMIN D) 1000 UNITS tablet Take 1,000 Units by mouth daily.     [provider]  Clobetasol Propionate (TEMOVATE) 0.05 % external spray Apply topically 2 (two) times daily. 10/27/21   Lavonna Monarch, MD  Cod Liver Oil CAPS Take 1 capsule by mouth every morning.    [provider]  cyclobenzaprine (FLEXERIL) 10 MG tablet TAKE 1 TABLET BY MOUTH DAILY AS  NEEDED FOR BACK MUSCLE SPASMS 07/20/22   Hoyt Koch, MD  famotidine (PEPCID) 20 MG tablet Take 1 tablet (20 mg total) by mouth daily. 07/17/22   Hoyt Koch, MD  fluticasone Asencion Islam) 50 MCG/ACT nasal spray Place 2 sprays into both nostrils daily. 08/28/22   Hoyt Koch, MD  hydroxychloroquine (PLAQUENIL) 200 MG tablet Take 200 mg by mouth 2 (two) times daily.    [provider]  lisinopril-hydrochlorothiazide (ZESTORETIC) 10-12.5 MG tablet Take 1 tablet by mouth  every morning. 04/08/22   Hoyt Koch, MD  Lutein-Zeaxanthin 25-5 MG CAPS Take 1 capsule by mouth every morning.    [provider]  misoprostol (CYTOTEC) 100 MCG tablet Take 1 tablet (100 mcg total) by mouth 2 (two) times daily. Pt taking as needed. 07/27/22   Hoyt Koch, MD  Multiple Vitamin (MULTIVITAMIN WITH MINERALS) TABS tablet Take 1 tablet by mouth daily.    [provider]  pravastatin (PRAVACHOL) 20 MG tablet Take 1 tablet (20 mg total) by mouth daily. 04/08/22   Hoyt Koch, MD  triamcinolone cream (KENALOG) 0.1 % Apply 1 application topically daily. Apply to psoriasis rash Patient taking differently: Apply 1 application  topically daily. Apply to psoriasis rash. Pt takes as needed. 06/20/19   Trula Slade, DPM  vitamin B-12 (CYANOCOBALAMIN) 1000 MCG tablet Take 1,000 mcg by mouth daily.    [provider]      Allergies    Azithromycin, Latex, and Prednisone    Review of Systems   Review of Systems  Physical Exam Updated Vital Signs BP (!) 144/90 (BP Location: Right Arm)   Pulse 76   Temp 97.8 F (36.6 C) (Oral)   Resp 20   Ht 5\' 10"  (1.778 m)   Wt 69.4 kg   SpO2 100%   BMI 21.95 kg/m  Physical Exam Vitals and nursing note reviewed.  Constitutional:      General: He  is not in acute distress.    Appearance: He is well-developed. He is not diaphoretic.  HENT:     Head: Normocephalic and atraumatic.  Eyes:     General: No scleral icterus.    Conjunctiva/sclera: Conjunctivae normal.  Cardiovascular:     Rate and Rhythm: Normal rate and regular rhythm.     Heart sounds: Normal heart sounds.  Pulmonary:     Effort: Pulmonary effort is normal. No respiratory distress.     Breath sounds: Normal breath sounds.  Abdominal:     Palpations: Abdomen is soft.     Tenderness: There is no abdominal tenderness.  Musculoskeletal:     Cervical back: Normal range of motion and neck supple.  Skin:    General: Skin  is warm and dry.  Neurological:     General: No focal deficit present.     Mental Status: He is alert and oriented to person, place, and time.     Cranial Nerves: No cranial nerve deficit.     Sensory: No sensory deficit.     Motor: No weakness.     Coordination: Coordination normal.     Gait: Gait normal.     Deep Tendon Reflexes: Reflexes normal.  Psychiatric:        Behavior: Behavior normal.    ED Results / Procedures / Treatments   Labs (all labs ordered are listed, but only abnormal results are displayed) Labs Reviewed - No data to display  EKG None  Radiology CT Head Wo Contrast  Result Date: 09/22/2022 CLINICAL DATA:  Headache after striking head.  Anti coagulation. EXAM: CT HEAD WITHOUT CONTRAST TECHNIQUE: Contiguous axial images were obtained from the base of the skull through the vertex without intravenous contrast. RADIATION DOSE REDUCTION: This exam was performed according to the departmental dose-optimization program which includes automated exposure control, adjustment of the mA and/or kV according to patient size and/or use of iterative reconstruction technique. COMPARISON:  09/13/2022 CT scan, also for head injury. FINDINGS: Brain: The brainstem, cerebellum, cerebral peduncles, thalami, basal ganglia, basilar cisterns, and ventricular system appear within normal limits. No intracranial hemorrhage, mass lesion, or acute CVA. Vascular: Unremarkable Skull: A broad spur along the basion pseudo articulates with the top of the odontoid as shown on image 32 series 5, and there is also some calcified pannus posterior to the odontoid; on the bottom most image 1 of series 2, the thecal sac is narrowed to 1 cm in anterior-posterior dimension as a result of these findings. This is not changed prior exams. There is spurring and loss of articular space at the anterior C1-2 articulation similar to prior exams. Degenerative spurring of the right mandibular condyle. Sinuses/Orbits:  Unremarkable Other: No supplemental non-categorized findings. IMPRESSION: 1. No acute intracranial findings. 2. Mildly narrowed AP dimension of the thecal sac in the retro odontoid region due to chronic pannus, spurring, and degenerative findings along the anterior craniocervical junction. 3. Degenerative spurring of the right mandibular condyle. Electronically Signed   By: Van Clines M.D.   On: 09/22/2022 20:44    Procedures Procedures  {Document cardiac monitor, telemetry assessment procedure when appropriate:1}  Medications Ordered in ED Medications - No data to display  ED Course/ Medical Decision Making/ A&P   {   Click here for ABCD2, HEART and other calculatorsREFRESH Note before signing :1}                          Medical Decision Making This  77 year old male who hit his head today on a birdcage.  He is on Eliquis however he has no obvious signs of trauma to the head.  Patient feeling improved and only has a minor headache with a normal neurologic exam.  His CT scan shows no evidence of abnormality.  I would lyse and interpreted CT head which shows no acute findings.  Patient appears otherwise appropriate for discharge at this time.  Discussed return precautions.  Amount and/or Complexity of Data Reviewed Radiology: ordered.   *** {Document critical care time when appropriate:1} {Document review of labs and clinical decision tools ie heart score, Chads2Vasc2 etc:1}  {Document your independent review of radiology images, and any outside records:1} {Document your discussion with family members, caretakers, and with consultants:1} {Document social determinants of health affecting pt's care:1} {Document your decision making why or why not admission, treatments were needed:1} Final Clinical Impression(s) / ED Diagnoses Final diagnoses:  None    Rx / DC Orders ED Discharge Orders     None

## 2022-09-22 NOTE — Telephone Encounter (Signed)
     Patient  visit on 09/13/2022  at [facility] was for head injury.  Have you been able to follow up with your primary care physician? No, patient is feeling better.  The patient was or was not able to obtain any needed medicine or equipment. No medication prescribed.  Are there diet recommendations that you are having difficulty following? No  Patient expresses understanding of discharge instructions and education provided has no other needs at this time. Yes   [facility] Resource Care Guide   ??[email or web address]  ?? [phone]   Website: [email or web address]  [email or web address]

## 2022-09-23 DIAGNOSIS — M170 Bilateral primary osteoarthritis of knee: Secondary | ICD-10-CM | POA: Diagnosis not present

## 2022-09-28 ENCOUNTER — Telehealth: Payer: Self-pay

## 2022-09-28 NOTE — Telephone Encounter (Signed)
        Patient  visited [facility] on 3/19     Telephone encounter attempt :  1st A HIPAA compliant voice message was left requesting a return call.  Instructed patient to call back     Juana Di­az [phone] [address], [place], [place] [address] Phone: [phone] Email: [email or web address]

## 2022-09-30 ENCOUNTER — Other Ambulatory Visit: Payer: Self-pay | Admitting: Internal Medicine

## 2022-09-30 DIAGNOSIS — M170 Bilateral primary osteoarthritis of knee: Secondary | ICD-10-CM | POA: Diagnosis not present

## 2022-10-02 DIAGNOSIS — I8312 Varicose veins of left lower extremity with inflammation: Secondary | ICD-10-CM | POA: Diagnosis not present

## 2022-10-02 DIAGNOSIS — I83893 Varicose veins of bilateral lower extremities with other complications: Secondary | ICD-10-CM | POA: Diagnosis not present

## 2022-10-02 DIAGNOSIS — I83891 Varicose veins of right lower extremities with other complications: Secondary | ICD-10-CM | POA: Diagnosis not present

## 2022-10-02 DIAGNOSIS — R600 Localized edema: Secondary | ICD-10-CM | POA: Diagnosis not present

## 2022-10-02 DIAGNOSIS — I8311 Varicose veins of right lower extremity with inflammation: Secondary | ICD-10-CM | POA: Diagnosis not present

## 2022-10-05 ENCOUNTER — Emergency Department (HOSPITAL_BASED_OUTPATIENT_CLINIC_OR_DEPARTMENT_OTHER): Payer: Medicare Other

## 2022-10-05 ENCOUNTER — Encounter (HOSPITAL_BASED_OUTPATIENT_CLINIC_OR_DEPARTMENT_OTHER): Payer: Self-pay

## 2022-10-05 ENCOUNTER — Other Ambulatory Visit: Payer: Self-pay

## 2022-10-05 DIAGNOSIS — Z87891 Personal history of nicotine dependence: Secondary | ICD-10-CM | POA: Diagnosis not present

## 2022-10-05 DIAGNOSIS — Z7901 Long term (current) use of anticoagulants: Secondary | ICD-10-CM | POA: Diagnosis not present

## 2022-10-05 DIAGNOSIS — Z79899 Other long term (current) drug therapy: Secondary | ICD-10-CM | POA: Insufficient documentation

## 2022-10-05 DIAGNOSIS — I10 Essential (primary) hypertension: Secondary | ICD-10-CM | POA: Diagnosis not present

## 2022-10-05 DIAGNOSIS — S0990XA Unspecified injury of head, initial encounter: Secondary | ICD-10-CM | POA: Insufficient documentation

## 2022-10-05 DIAGNOSIS — Z91040 Latex allergy status: Secondary | ICD-10-CM | POA: Diagnosis not present

## 2022-10-05 DIAGNOSIS — Z8616 Personal history of COVID-19: Secondary | ICD-10-CM | POA: Diagnosis not present

## 2022-10-05 DIAGNOSIS — W228XXA Striking against or struck by other objects, initial encounter: Secondary | ICD-10-CM | POA: Diagnosis not present

## 2022-10-05 NOTE — ED Triage Notes (Signed)
POV from home, A&O x 4, amb to triage, GCS 15  Hit head today on metal bird cage on the right side of head, c/o headache, denies LOC.   Takes elliquis

## 2022-10-06 ENCOUNTER — Emergency Department (HOSPITAL_BASED_OUTPATIENT_CLINIC_OR_DEPARTMENT_OTHER)
Admission: EM | Admit: 2022-10-06 | Discharge: 2022-10-06 | Disposition: A | Discharge status: Home / Self Care | Payer: Medicare Other | Attending: Emergency Medicine | Admitting: Emergency Medicine

## 2022-10-06 DIAGNOSIS — S0990XA Unspecified injury of head, initial encounter: Secondary | ICD-10-CM

## 2022-10-06 NOTE — ED Notes (Signed)
Pt discharged to home, NAD noted 

## 2022-10-06 NOTE — ED Provider Notes (Signed)
[facility] Provider Note: Georgena Spurling, MD, FACEP  CSN: [id number] MRN: [id number] ARRIVAL: 10/05/22 at 2327 ROOM: DB007/DB007   CHIEF COMPLAINT  Head Injury   HISTORY OF PRESENT ILLNESS  10/06/22 12:56 AM Richard [name] is a 77 y.o. male who is on Eliquis for atrial fibrillation.  Who was struck his right parietal head against a metal birdcage about 8 PM.  He did not lose consciousness.  He has not been vomiting.  He denies any pain at the site.  He denies a headache.  He denies any focal neurologic deficits.   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    Atrial fibrillation    Back pain    Dyspnea    history of covid 09/2018 and with extertion    Dysrhythmia    GERD (gastroesophageal reflux disease)    Headache    Hypertension    Pain    PAIN AND OA RT KNEE   Pneumonia    YEARS AGO   Psoriasis    Snoring 04/27/2014    Past Surgical History:  Procedure Laterality Date   INGUINAL HERNIA REPAIR Left 01/29/2021   Procedure: LEFT INGUINAL HERNIA REPAIR WITH MESH;  Surgeon: Rolm Bookbinder, MD;  Location: [facility];  Service: General;  Laterality: Left;   KNEE ARTHROSCOPY Right 01/31/2014   Procedure: RIGHT ARTHROSCOPY KNEE WITH DEBRIDMENT CHONDROPLASTY;  Surgeon: [name], MD;  Location: [facility];  Service: Orthopedics;  Laterality: Right;   LUMP REMOVED FROM LEFT BREAST Left 07/06/2006   STATES SURGERY  AT [facility] - LOCAL- NOT CANCER 5 OR MORE YRS AGO   MORTON'S NEUROMA REMOVED     TONSILLECTOMY     AS A CHILD   TRANSTHORACIC ECHOCARDIOGRAM  07/2020   Normal LV size and function.  GR 1 DD.  Mild RV enlargement.  Moderate LA & RA enlargement.  Mild MR.  MR likely related to mildly for MR/MVP.  Moderate TR, mildly elevated RAP.Marland Kitchen  Moderate ao valve thickening.  Mild AOV sclerosis but no stenosis.    Family History  Problem Relation Age of Onset   Diabetes Mother    Hypertension Mother     Heart disease Mother        has a stent    Stroke Father 30   Hypertension Father     Social History   Tobacco Use   Smoking status: Former    Packs/day: .25    Types: Cigarettes    Quit date: 1990    Years since quitting: 34.2   Smokeless tobacco: Never  Vaping Use   Vaping Use: Never used  Substance Use Topics   Alcohol use: Yes    Comment: QUIT SMOKING ABOUT 15 YRS AGO--WAS ONLY 2 OR 3 CIGARETTES WITH A BEER ON WEEK ENDS   Drug use: No    Prior to Admission medications   Medication Sig Start Date End Date Taking? Authorizing Provider  apixaban (ELIQUIS) 5 MG TABS tablet Take 1 tablet (5 mg total) by mouth 2 (two) times daily. 04/08/22   Hoyt Koch, MD  CALCIUM-MAGNESIUM-ZINC PO Take 1 tablet by mouth 2 (two) times daily.     [provider]  cholecalciferol (VITAMIN D) 1000 UNITS tablet Take 1,000 Units by mouth daily.     [provider]  Clobetasol Propionate (TEMOVATE) 0.05 % external spray Apply topically 2 (two) times daily. 10/27/21   Lavonna Monarch,  MD  Cod Liver Oil CAPS Take 1 capsule by mouth every morning.    [provider]  cyclobenzaprine (FLEXERIL) 10 MG tablet TAKE 1 TABLET BY MOUTH DAILY AS  NEEDED FOR BACK MUSCLE SPASMS 07/20/22   Hoyt Koch, MD  famotidine (PEPCID) 20 MG tablet Take 1 tablet (20 mg total) by mouth daily. 07/17/22   Hoyt Koch, MD  fluticasone Asencion Islam) 50 MCG/ACT nasal spray Place 2 sprays into both nostrils daily. 08/28/22   Hoyt Koch, MD  hydroxychloroquine (PLAQUENIL) 200 MG tablet Take 200 mg by mouth 2 (two) times daily.    [provider]  lisinopril-hydrochlorothiazide (ZESTORETIC) 10-12.5 MG tablet Take 1 tablet by mouth every morning. 04/08/22   Hoyt Koch, MD  Lutein-Zeaxanthin 25-5 MG CAPS Take 1 capsule by mouth every morning.    [provider]  misoprostol (CYTOTEC) 100 MCG tablet TAKE 1 TABLET BY MOUTH TWICE  DAILY AS NEEDED  10/01/22   Hoyt Koch, MD  Multiple Vitamin (MULTIVITAMIN WITH MINERALS) TABS tablet Take 1 tablet by mouth daily.    [provider]  pravastatin (PRAVACHOL) 20 MG tablet Take 1 tablet (20 mg total) by mouth daily. 04/08/22   Hoyt Koch, MD  triamcinolone cream (KENALOG) 0.1 % Apply 1 application topically daily. Apply to psoriasis rash Patient taking differently: Apply 1 application  topically daily. Apply to psoriasis rash. Pt takes as needed. 06/20/19   Trula Slade, DPM  vitamin B-12 (CYANOCOBALAMIN) 1000 MCG tablet Take 1,000 mcg by mouth daily.    [provider]    Allergies Azithromycin, Latex, and Prednisone   REVIEW OF SYSTEMS  Negative except as noted here or in the History of Present Illness.   PHYSICAL EXAMINATION  Initial Vital Signs Blood pressure 111/80, pulse (!) 108, temperature 98.3 F (36.8 C), resp. rate 17, height 5\' 10"  (1.778 m), weight 69.4 kg, SpO2 98 %.  Examination General: Well-developed, well-nourished male in no acute distress; appearance consistent with age of record HENT: normocephalic; no hematomas seen or palpated at site of injury Eyes: pupils equal, round and reactive to light; extraocular muscles intact Neck: supple; nontender Heart: Irregular rhythm Lungs: clear to auscultation bilaterally Abdomen: soft; nondistended; nontender; bowel sounds present Extremities: No deformity; full range of motion Neurologic: Awake, alert and oriented; motor function intact in all extremities and symmetric; no facial droop; normal coordination, speech and gait Skin: Warm and dry Psychiatric: Normal mood and affect   RESULTS  Summary of this visit's results, reviewed and interpreted by myself:   EKG Interpretation  Date/Time:    Ventricular Rate:    PR Interval:    QRS Duration:   QT Interval:    QTC Calculation:   R Axis:     Text Interpretation:         Laboratory Studies: No results found for this  or any previous visit (from the past 24 hour(s)). Imaging Studies: CT Head Wo Contrast  Result Date: 10/05/2022 CLINICAL DATA:  Head trauma EXAM: CT HEAD WITHOUT CONTRAST TECHNIQUE: Contiguous axial images were obtained from the base of the skull through the vertex without intravenous contrast. RADIATION DOSE REDUCTION: This exam was performed according to the departmental dose-optimization program which includes automated exposure control, adjustment of the mA and/or kV according to patient size and/or use of iterative reconstruction technique. COMPARISON:  09/22/2022 FINDINGS: Brain: No evidence of acute infarction, hemorrhage, mass, mass effect, or midline shift. No hydrocephalus or extra-axial fluid collection. Periventricular white matter  changes, likely the sequela of chronic small vessel ischemic disease. Vascular: No hyperdense vessel. Skull: Negative for fracture or focal lesion. Redemonstrated degenerative changes at the craniocervical junction, which narrows the proximal cervical spinal canal, incompletely evaluated. Sinuses/Orbits: No acute finding. Other: The mastoid air cells are well aerated. IMPRESSION: No acute intracranial process. Electronically Signed   By: Merilyn Baba M.D.   On: 10/05/2022 23:55    ED COURSE and MDM  Nursing notes, initial and subsequent vitals signs, including pulse oximetry, reviewed and interpreted by myself.  Vitals:   10/05/22 2333 10/05/22 2334  BP:  111/80  Pulse:  (!) 108  Resp:  17  Temp:  98.3 F (36.8 C)  SpO2:  98%  Weight: 69.4 kg   Height: 5\' 10"  (1.778 m)    Medications - No data to display  Patient has a normal neurologic examination and an unremarkable head CT.  PROCEDURES  Procedures   ED DIAGNOSES     ICD-10-CM   1. Minor head injury, initial encounter  S09.90XA          Kevork Joyce, MD 10/06/22 0100

## 2022-10-08 NOTE — Progress Notes (Signed)
HPI: FU atrial fibrillation.  Holter monitor February 2021 showed sinus rhythm with PACs, couplets, bigeminy, atrial run and bradycardia during early a.m. hours. Since having Covid March 2020 he has had some fatigue with activities as well as dyspnea with more vigorous activities relieved with rest.  At initial office visit July 2021 patient was found to have newly diagnosed atrial fibrillation. We added apixaban. Abdominal ultrasound August 2021 showed no aneurysm. Echocardiogram January 2022 showed normal LV function, grade 1 diastolic dysfunction, mild right ventricular enlargement, moderate left atrial enlargement, moderate right atrial enlargement, mild mitral regurgitation secondary to bileaflet mitral valve prolapse, moderate tricuspid regurgitation.  Patient previously found to be in recurrent atrial fibrillation and there was discussion about rate control versus rhythm control.  He preferred rate control.  Note patient has been seen 5 times in the emergency room since February with concern for head trauma after striking his head on birdcage and falling.  Since last seen patient denies dyspnea, chest pain, palpitations or syncope.  Note he does not typically fall.  Current Outpatient Medications  Medication Sig Dispense Refill   apixaban (ELIQUIS) 5 MG TABS tablet Take 1 tablet (5 mg total) by mouth 2 (two) times daily. 180 tablet 3   CALCIUM-MAGNESIUM-ZINC PO Take 1 tablet by mouth 2 (two) times daily.      cholecalciferol (VITAMIN D) 1000 UNITS tablet Take 1,000 Units by mouth daily.      Clobetasol Propionate (TEMOVATE) 0.05 % external spray Apply topically 2 (two) times daily. 59 mL 6   Cod Liver Oil CAPS Take 1 capsule by mouth every morning.     cyclobenzaprine (FLEXERIL) 10 MG tablet TAKE 1 TABLET BY MOUTH DAILY AS  NEEDED FOR BACK MUSCLE SPASMS 90 tablet 1   famotidine (PEPCID) 20 MG tablet Take 1 tablet (20 mg total) by mouth daily. 90 tablet 3   fluticasone (FLONASE) 50 MCG/ACT  nasal spray Place 2 sprays into both nostrils daily. 48 g 3   hydroxychloroquine (PLAQUENIL) 200 MG tablet Take 200 mg by mouth 2 (two) times daily.     lisinopril-hydrochlorothiazide (ZESTORETIC) 10-12.5 MG tablet Take 1 tablet by mouth every morning. 90 tablet 3   Lutein-Zeaxanthin 25-5 MG CAPS Take 1 capsule by mouth every morning.     misoprostol (CYTOTEC) 100 MCG tablet TAKE 1 TABLET BY MOUTH TWICE  DAILY AS NEEDED 240 tablet 2   Multiple Vitamin (MULTIVITAMIN WITH MINERALS) TABS tablet Take 1 tablet by mouth daily.     pravastatin (PRAVACHOL) 20 MG tablet Take 1 tablet (20 mg total) by mouth daily. 90 tablet 3   triamcinolone cream (KENALOG) 0.1 % Apply 1 application topically daily. Apply to psoriasis rash (Patient taking differently: Apply 1 application  topically daily. Apply to psoriasis rash. Pt takes as needed.) 30 g 1   vitamin B-12 (CYANOCOBALAMIN) 1000 MCG tablet Take 1,000 mcg by mouth daily.     No current facility-administered medications for this visit.     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    Atrial fibrillation    Back pain    Dyspnea    history of covid 09/2018 and with extertion    Dysrhythmia    GERD (gastroesophageal reflux disease)    Headache    Hypertension    Pain    PAIN AND OA RT KNEE   Pneumonia    YEARS AGO   Psoriasis  arthritis   Snoring 04/27/2014    Past Surgical History:  Procedure Laterality Date   INGUINAL HERNIA REPAIR Left 01/29/2021   Procedure: LEFT INGUINAL HERNIA REPAIR WITH MESH;  Surgeon: Emelia Loron, MD;  Location: [facility];  Service: General;  Laterality: Left;   KNEE ARTHROSCOPY Right 01/31/2014   Procedure: RIGHT ARTHROSCOPY KNEE WITH DEBRIDMENT CHONDROPLASTY;  Surgeon: Loanne Drilling, MD;  Location: [facility];  Service: Orthopedics;  Laterality: Right;   LUMP REMOVED FROM LEFT BREAST Left 07/06/2006   STATES SURGERY  AT [facility] - LOCAL- NOT  CANCER 5 OR MORE YRS AGO   MORTON'S NEUROMA REMOVED     TONSILLECTOMY     AS A CHILD   TRANSTHORACIC ECHOCARDIOGRAM  07/2020   Normal LV size and function.  GR 1 DD.  Mild RV enlargement.  Moderate LA & RA enlargement.  Mild MR.  MR likely related to mildly for MR/MVP.  Moderate TR, mildly elevated RAP.Marland Kitchen  Moderate ao valve thickening.  Mild AOV sclerosis but no stenosis.    Social History   Socioeconomic History   Marital status: Married    Spouse name: Not on file   Number of children: 0   Years of education: Not on file   Highest education level: Bachelor's degree (e.g., BA, AB, BS)  Occupational History   Not on file  Tobacco Use   Smoking status: Former    Packs/day: .25    Types: Cigarettes    Quit date: 1990    Years since quitting: 34.2   Smokeless tobacco: Never  Vaping Use   Vaping Use: Never used  Substance and Sexual Activity   Alcohol use: Yes    Comment: QUIT SMOKING ABOUT 15 YRS AGO--WAS ONLY 2 OR 3 CIGARETTES WITH A BEER ON WEEK ENDS   Drug use: No   Sexual activity: Yes  Other Topics Concern   Not on file  Social History Narrative   Right handed, caffeine 2-3 cups daily, married, no kids, has baby bird.  WC back injury, so not working at this time.  BS in econmics.   Social Determinants of Health   Financial Resource Strain: Low Risk  (10/14/2022)   Overall Financial Resource Strain (CARDIA)    Difficulty of Paying Living Expenses: Not hard at all  Food Insecurity: No Food Insecurity (10/14/2022)   Hunger Vital Sign    Worried About Running Out of Food in the Last Year: Never true    Ran Out of Food in the Last Year: Never true  Transportation Needs: No Transportation Needs (10/14/2022)   PRAPARE - Administrator, Civil Service (Medical): No    Lack of Transportation (Non-Medical): No  Physical Activity: Not on file  Stress: Not on file  Social Connections: Unknown (10/14/2022)   Social Connection and Isolation Panel [NHANES]    Frequency  of Communication with Friends and Family: More than three times a week    Frequency of Social Gatherings with Friends and Family: Once a week    Attends Religious Services: Patient declined    Database administrator or Organizations: No    Attends Engineer, structural: Not on file    Marital Status: Married  Catering manager Violence: Not on file    Family History  Problem Relation Age of Onset   Diabetes Mother    Hypertension Mother    Heart disease Mother        has a stent    Stroke Father  90   Hypertension Father     ROS: no fevers or chills, productive cough, hemoptysis, dysphasia, odynophagia, melena, hematochezia, dysuria, hematuria, rash, seizure activity, orthopnea, PND, pedal edema, claudication. Remaining systems are negative.  Physical Exam: Well-developed well-nourished in no acute distress.  Skin is warm and dry.  HEENT is normal.  Neck is supple.  Chest is clear to auscultation with normal expansion.  Cardiovascular exam is irregular Abdominal exam nontender or distended. No masses palpated. Extremities show trace edema. neuro grossly intact   A/P  1 paroxysmal atrial fibrillation-patient has elected rate control and not to pursue rhythm control.  He is on apixaban.  However he has been seen 5 times in the emergency room since mid February after striking his head.  However he states these have not been severe episodes of trauma and he was being more cautious.  We discussed the potential for referral to have watchman placed to avoid long-term anticoagulation but he would prefer to be conservative at this point.  We will consider in the future if he has more frequent episodes and if he is agreeable.  2 mitral valve prolapse with mild mitral regurgitation-we will plan to repeat echocardiogram as patient also has moderate tricuspid regurgitation.  3 hypertension-blood pressure controlled.  Continue present medical regimen.  4 hyperlipidemia-continue  statin.  Olga MillersBrian Weronika Birch, MD

## 2022-10-10 ENCOUNTER — Other Ambulatory Visit: Payer: Self-pay

## 2022-10-10 ENCOUNTER — Emergency Department (HOSPITAL_BASED_OUTPATIENT_CLINIC_OR_DEPARTMENT_OTHER): Payer: Medicare Other

## 2022-10-10 ENCOUNTER — Emergency Department (HOSPITAL_BASED_OUTPATIENT_CLINIC_OR_DEPARTMENT_OTHER)
Admission: EM | Admit: 2022-10-10 | Discharge: 2022-10-10 | Disposition: A | Discharge status: Home / Self Care | Payer: Medicare Other | Attending: Emergency Medicine | Admitting: Emergency Medicine

## 2022-10-10 ENCOUNTER — Encounter (HOSPITAL_BASED_OUTPATIENT_CLINIC_OR_DEPARTMENT_OTHER): Payer: Self-pay | Admitting: Emergency Medicine

## 2022-10-10 DIAGNOSIS — Z7901 Long term (current) use of anticoagulants: Secondary | ICD-10-CM | POA: Insufficient documentation

## 2022-10-10 DIAGNOSIS — S0083XA Contusion of other part of head, initial encounter: Secondary | ICD-10-CM | POA: Diagnosis not present

## 2022-10-10 DIAGNOSIS — W01190A Fall on same level from slipping, tripping and stumbling with subsequent striking against furniture, initial encounter: Secondary | ICD-10-CM | POA: Insufficient documentation

## 2022-10-10 DIAGNOSIS — S0003XA Contusion of scalp, initial encounter: Secondary | ICD-10-CM | POA: Diagnosis not present

## 2022-10-10 DIAGNOSIS — Z91040 Latex allergy status: Secondary | ICD-10-CM | POA: Insufficient documentation

## 2022-10-10 DIAGNOSIS — S0990XA Unspecified injury of head, initial encounter: Secondary | ICD-10-CM | POA: Diagnosis not present

## 2022-10-10 DIAGNOSIS — W19XXXA Unspecified fall, initial encounter: Secondary | ICD-10-CM

## 2022-10-10 DIAGNOSIS — T148XXA Other injury of unspecified body region, initial encounter: Secondary | ICD-10-CM

## 2022-10-10 DIAGNOSIS — S199XXA Unspecified injury of neck, initial encounter: Secondary | ICD-10-CM | POA: Diagnosis not present

## 2022-10-10 MED ORDER — ACETAMINOPHEN 325 MG PO TABS
650.0000 mg | ORAL_TABLET | Freq: Once | ORAL | Status: AC
  Administered 2022-10-10: 650 mg via ORAL
  Filled 2022-10-10: qty 2

## 2022-10-10 NOTE — ED Notes (Signed)
Patient transported to CT 

## 2022-10-10 NOTE — ED Notes (Signed)
Pt wants to wait on tylenol,ice pack applied to his left side of head

## 2022-10-10 NOTE — ED Provider Notes (Signed)
[place] [facility]ERGENCY DEPARTMENT AT [facility] Provider Note   CSN: [id number] Arrival date & time: 10/10/22  1149     History  Chief Complaint  Patient presents with   fall on thinners    Richard [name] is a 77 y.o. male.  Patient is a 77 year old male with a past medical history of A-fib on Eliquis presenting to the emergency department after a fall.  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  He states that this caused him to fall and he hit the left side of his head on their cabinet.  He denies any loss of consciousness.  He denies any numbness or weakness, nausea or vomiting or any other injuries from the fall.  He denies any lightheadedness or dizziness, chest pain or shortness of breath prior to the fall.  The patient has been seen in the emergency department several times over the last month for his falls but he states that he will frequently hit his head on cabinets and doors and not always falling.  He denies feeling unsteady on his feet or having any weakness.  The history is provided by the patient and the spouse.       Home Medications Prior to Admission medications   Medication Sig Start Date End Date Taking? Authorizing Provider  apixaban (ELIQUIS) 5 MG TABS tablet Take 1 tablet (5 mg total) by mouth 2 (two) times daily. 04/08/22   Myrlene Broker, MD  CALCIUM-MAGNESIUM-ZINC PO Take 1 tablet by mouth 2 (two) times daily.     [provider]  cholecalciferol (VITAMIN D) 1000 UNITS tablet Take 1,000 Units by mouth daily.     [provider]  Clobetasol Propionate (TEMOVATE) 0.05 % external spray Apply topically 2 (two) times daily. 10/27/21   Janalyn Harder, MD  Cod Liver Oil CAPS Take 1 capsule by mouth every morning.    [provider]  cyclobenzaprine (FLEXERIL) 10 MG tablet TAKE 1 TABLET BY MOUTH DAILY AS  NEEDED FOR  BACK MUSCLE SPASMS 07/20/22   Myrlene Broker, MD  famotidine (PEPCID) 20 MG tablet Take 1 tablet (20 mg total) by mouth daily. 07/17/22   Myrlene Broker, MD  fluticasone Aleda Grana) 50 MCG/ACT nasal spray Place 2 sprays into both nostrils daily. 08/28/22   Myrlene Broker, MD  hydroxychloroquine (PLAQUENIL) 200 MG tablet Take 200 mg by mouth 2 (two) times daily.    [provider]  lisinopril-hydrochlorothiazide (ZESTORETIC) 10-12.5 MG tablet Take 1 tablet by mouth every morning. 04/08/22   Myrlene Broker, MD  Lutein-Zeaxanthin 25-5 MG CAPS Take 1 capsule by mouth every morning.    [provider]  misoprostol (CYTOTEC) 100 MCG tablet TAKE 1 TABLET BY MOUTH TWICE  DAILY AS NEEDED 10/01/22   Myrlene Broker, MD  Multiple Vitamin (MULTIVITAMIN WITH MINERALS) TABS tablet Take 1 tablet by mouth daily.    [provider]  pravastatin (PRAVACHOL) 20 MG tablet Take 1 tablet (20 mg total) by mouth daily. 04/08/22   Myrlene Broker, MD  triamcinolone cream (KENALOG) 0.1 % Apply 1 application topically daily. Apply to psoriasis rash Patient taking differently: Apply 1 application  topically daily. Apply to psoriasis rash. Pt takes as needed. 06/20/19   Vivi Barrack, DPM  vitamin B-12 (CYANOCOBALAMIN) 1000 MCG tablet Take 1,000 mcg by mouth daily.    [provider]      Allergies    Azithromycin, Latex, and Prednisone    Review of Systems   Review of Systems  Physical Exam Updated Vital Signs BP 116/78   Pulse 75   Temp 97.9 F (36.6 C) (Oral)   Resp 16   SpO2 100%  Physical Exam Vitals and nursing note reviewed.  Constitutional:      General: He is not in acute distress.    Appearance: Normal appearance.  HENT:     Head: Normocephalic.     Comments: Hematoma to L posterior scalp    Nose: Nose normal.     Mouth/Throat:     Mouth: Mucous membranes are moist.     Pharynx: Oropharynx is clear.  Eyes:     Extraocular  Movements: Extraocular movements intact.     Conjunctiva/sclera: Conjunctivae normal.     Pupils: Pupils are equal, round, and reactive to light.  Neck:     Comments: No midline neck tenderness Cardiovascular:     Rate and Rhythm: Normal rate and regular rhythm.     Heart sounds: Normal heart sounds.  Pulmonary:     Effort: Pulmonary effort is normal.     Breath sounds: Normal breath sounds.  Abdominal:     General: Abdomen is flat.     Palpations: Abdomen is soft.     Tenderness: There is no abdominal tenderness.  Musculoskeletal:     Cervical back: Normal range of motion and neck supple.     Comments: No midline back tenderness Pelvis stable, nontender No bony tenderness to bilateral upper or lower extremities  Skin:    General: Skin is warm and dry.     Findings: No bruising.  Neurological:     General: No focal deficit present.     Mental Status: He is alert and oriented to person, place, and time.     Cranial Nerves: No cranial nerve deficit.     Sensory: No sensory deficit.     Motor: No weakness.  Psychiatric:        Mood and Affect: Mood normal.        Behavior: Behavior normal.     ED Results / Procedures / Treatments   Labs (all labs ordered are listed, but only abnormal results are displayed) Labs Reviewed - No data to display  EKG None  Radiology CT Head Wo Contrast  Result Date: 10/10/2022 CLINICAL DATA:  Fall today. Blunt head and neck trauma. On Eliquis. EXAM: CT HEAD WITHOUT CONTRAST CT CERVICAL SPINE WITHOUT CONTRAST TECHNIQUE: Multidetector CT imaging of the head and cervical spine was performed following the standard protocol without intravenous contrast. Multiplanar CT image reconstructions of the cervical spine were also generated. RADIATION DOSE REDUCTION: This exam was performed according to the departmental dose-optimization program which includes automated exposure control, adjustment of the mA and/or kV according to patient size and/or use of  iterative reconstruction technique. COMPARISON:  Head CT on 10/05/2022 FINDINGS: CT HEAD FINDINGS Brain: No evidence of intracranial hemorrhage, acute infarction, hydrocephalus, extra-axial collection, or mass lesion/mass effect. Vascular:  No hyperdense vessel or other acute findings. Skull: No evidence of fracture or other significant bone abnormality. Sinuses/Orbits:  No acute findings. Other: None. CT CERVICAL SPINE FINDINGS Alignment: Normal. Skull base and vertebrae: No acute fracture. No primary bone lesion or focal pathologic process. Soft tissues and spinal canal: No prevertebral fluid or swelling. No visible canal hematoma. 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 Severe  atlantoaxial degenerative changes are also noted. Bilateral cervical facet DJD and ankylosis is seen on the right at C2-3 and C3-4 and on the left at C2-3. Upper chest: No acute findings. Other: None. IMPRESSION: Negative noncontrast head CT. No evidence of acute cervical spine fracture or subluxation. Degenerative spondylosis, as described above. Electronically Signed   By: Danae Orleans M.D.   On: 10/10/2022 12:58   CT Cervical Spine Wo Contrast  Result Date: 10/10/2022 CLINICAL DATA:  Fall today. Blunt head and neck trauma. On Eliquis. EXAM: CT HEAD WITHOUT CONTRAST CT CERVICAL SPINE WITHOUT CONTRAST TECHNIQUE: Multidetector CT imaging of the head and cervical spine was performed following the standard protocol without intravenous contrast. Multiplanar CT image reconstructions of the cervical spine were also generated. RADIATION DOSE REDUCTION: This exam was performed according to the departmental dose-optimization program which includes automated exposure control, adjustment of the mA and/or kV according to patient size and/or use of iterative reconstruction technique. COMPARISON:  Head CT on 10/05/2022 FINDINGS: CT HEAD FINDINGS Brain: No evidence of intracranial hemorrhage, acute infarction,  hydrocephalus, extra-axial collection, or mass lesion/mass effect. Vascular:  No hyperdense vessel or other acute findings. Skull: No evidence of fracture or other significant bone abnormality. Sinuses/Orbits:  No acute findings. Other: None. CT CERVICAL SPINE FINDINGS Alignment: Normal. Skull base and vertebrae: No acute fracture. No primary bone lesion or focal pathologic process. Soft tissues and spinal canal: No prevertebral fluid or swelling. No visible canal hematoma. 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 Severe atlantoaxial degenerative changes are also noted. Bilateral cervical facet DJD and ankylosis is seen on the right at C2-3 and C3-4 and on the left at C2-3. Upper chest: No acute findings. Other: None. IMPRESSION: Negative noncontrast head CT. No evidence of acute cervical spine fracture or subluxation. Degenerative spondylosis, as described above. Electronically Signed   By: Danae Orleans M.D.   On: 10/10/2022 12:58    Procedures Procedures    Medications Ordered in ED Medications  acetaminophen (TYLENOL) tablet 650 mg (has no administration in time range)    ED Course/ Medical Decision Making/ A&P                             Medical Decision Making This patient presents to the ED with chief complaint(s) of fall with pertinent past medical history of a fib on Eliquis which further complicates the presenting complaint. The complaint involves an extensive differential diagnosis and also carries with it a high risk of complications and morbidity.    The differential diagnosis includes ICH, mass effect, cervical spine fracture, hematoma, no other traumatic injuries seen on exam, no presyncopal symptoms making syncopal fall unlikely  Additional history obtained: Additional history obtained from spouse Records reviewed Primary Care Documents and recent ED records  ED Course and Reassessment: Patient is well-appearing on arrival no acute distress  with no focal neurologic deficits.  Due to his age and fall on thinners, he will have a head CT and C-spine performed.  He was given ice pack for his hematoma and Tylenol for pain and will be closely reassessed.  Independent labs interpretation:  N/A  Independent visualization of imaging: - I independently visualized the following imaging with scope of interpretation limited to determining acute life threatening conditions related to emergency care: CTH/C-spine, which revealed no acute traumatic injury  Consultation: - Consulted or discussed management/test interpretation w/ external professional: N/A  Consideration for admission or further  workup: Patient has no emergent conditions requiring admission or further work-up at this time and is stable for discharge home with primary care follow-up  Social Determinants of health: N/A    Amount and/or Complexity of Data Reviewed Radiology: ordered.  Risk OTC drugs.          Final Clinical Impression(s) / ED Diagnoses Final diagnoses:  Fall, initial encounter  Hematoma    Rx / DC Orders ED Discharge Orders     None         Rexford Maus, DO 10/10/22 1320

## 2022-10-10 NOTE — Discharge Instructions (Signed)
You were seen in the emergency department after your fall.  Your CT imaging showed no bleeding in your brain or broken bones.  You do have a hematoma on your head and you can continue to ice this to help with the swelling and take Tylenol as needed for pain.  You should take preventative steps in your home to help prevent recurrent falls such as placing mats over your cords or taping them down to the ground.  You should follow-up with your primary doctor in the next few days to have your symptoms rechecked.  You should return to the emergency department for worsening headaches, repetitive vomiting, numbness or weakness on one side of your body compared to the other or if you have any other new or concerning symptoms.

## 2022-10-10 NOTE — ED Notes (Signed)
Pt insists on walking to CT, refuses wheelchair

## 2022-10-10 NOTE — ED Triage Notes (Signed)
Pt tripped and fell sideways hitting the left side of his on cabinet today. Pt is on eliquis.no neuro changes

## 2022-10-12 ENCOUNTER — Telehealth: Payer: Self-pay

## 2022-10-12 NOTE — Telephone Encounter (Signed)
     Patient  visit on 4/2  at [facility]   Have you been able to follow up with your primary care physician? Yes   The patient was or was not able to obtain any needed medicine or equipment. Yes   Are there diet recommendations that you are having difficulty following? Na   Patient expresses understanding of discharge instructions and education provided has no other needs at this time.  Yes     ED Follow Up call

## 2022-10-14 ENCOUNTER — Telehealth: Payer: Self-pay

## 2022-10-14 ENCOUNTER — Telehealth: Payer: Self-pay | Admitting: *Deleted

## 2022-10-14 DIAGNOSIS — I10 Essential (primary) hypertension: Secondary | ICD-10-CM

## 2022-10-14 NOTE — Telephone Encounter (Signed)
        Patient  visited [facility] ed on 10/10/2022  for treatment    Telephone encounter attempt :  1st  no answer  Yehuda Mao Greenauer -[facility] [place], Population Health [phone] [address] [place] , [place] [address] Email : [email or web address]

## 2022-10-14 NOTE — Patient Outreach (Signed)
Received a referral from Dione Booze, Care Guide for [facility] for Multiple ED visits.   I have sent this referral to the [facility] Care Guides to assign a Nurse for follow up and determine if there are any Care Management needs.

## 2022-10-15 ENCOUNTER — Ambulatory Visit: Payer: Medicare Other | Attending: Cardiology | Admitting: Cardiology

## 2022-10-15 ENCOUNTER — Encounter: Payer: Self-pay | Admitting: Cardiology

## 2022-10-15 ENCOUNTER — Telehealth: Payer: Self-pay | Admitting: *Deleted

## 2022-10-15 VITALS — BP 130/70 | HR 92 | Ht 70.0 in | Wt 161.8 lb

## 2022-10-15 DIAGNOSIS — I10 Essential (primary) hypertension: Secondary | ICD-10-CM

## 2022-10-15 DIAGNOSIS — I341 Nonrheumatic mitral (valve) prolapse: Secondary | ICD-10-CM

## 2022-10-15 DIAGNOSIS — I4821 Permanent atrial fibrillation: Secondary | ICD-10-CM

## 2022-10-15 DIAGNOSIS — E7800 Pure hypercholesterolemia, unspecified: Secondary | ICD-10-CM

## 2022-10-15 NOTE — Patient Instructions (Signed)
  Testing/Procedures:  Your physician has requested that you have an echocardiogram. Echocardiography is a painless test that uses sound waves to create images of your heart. It provides your doctor with information about the size and shape of your heart and how well your heart's chambers and valves are working. This procedure takes approximately one hour. There are no restrictions for this procedure. Please do NOT wear cologne, perfume, aftershave, or lotions (deodorant is allowed). Please arrive 15 minutes prior to your appointment time. [address]   Follow-Up: At [facility], you and your health needs are our priority.  As part of our continuing mission to provide you with exceptional heart care, we have created designated Provider Care Teams.  These Care Teams include your primary Cardiologist (physician) and Advanced Practice Providers (APPs -  Physician Assistants and Nurse Practitioners) who all work together to provide you with the care you need, when you need it.  We recommend signing up for the patient portal called "MyChart".  Sign up information is provided on this After Visit Summary.  MyChart is used to connect with patients for Virtual Visits (Telemedicine).  Patients are able to view lab/test results, encounter notes, upcoming appointments, etc.  Non-urgent messages can be sent to your provider as well.   To learn more about what you can do with MyChart, go to [email or web address].    Your next appointment:   6 month(s)  Provider:   Brian Crenshaw, MD      

## 2022-10-15 NOTE — Progress Notes (Signed)
  Care Coordination   Note   10/15/2022 Name: Richard [name] MRN: [id number] DOB: 1946-02-03  [name] is a 77 y.o. year old male who sees Myrlene Broker, MD for primary care. I reached out to Estill Bamberg by phone today to offer care coordination services.  [name] was given information about Care Coordination services today including:   The Care Coordination services include support from the care team which includes your Nurse Coordinator, Clinical Social Worker, or Pharmacist.  The Care Coordination team is here to help remove barriers to the health concerns and goals most important to you. Care Coordination services are voluntary, and the patient may decline or stop services at any time by request to their care team member.   Care Coordination Consent Status: Patient did not agree to participate in care coordination services at this time.  Follow up plan:  pt appreciative but would like to discuss with Dr Okey Dupre before scheduling - contact info given if pt wants to schedule   Encounter Outcome:  Pt. Refused  Burman Nieves, CCMA Care Coordination Care Guide Direct Dial: [phone]

## 2022-10-16 ENCOUNTER — Ambulatory Visit (INDEPENDENT_AMBULATORY_CARE_PROVIDER_SITE_OTHER): Payer: Medicare Other | Admitting: Internal Medicine

## 2022-10-16 ENCOUNTER — Encounter: Payer: Self-pay | Admitting: Internal Medicine

## 2022-10-16 VITALS — BP 120/84 | HR 84 | Temp 98.3°F | Ht 70.0 in | Wt 162.0 lb

## 2022-10-16 DIAGNOSIS — I480 Paroxysmal atrial fibrillation: Secondary | ICD-10-CM

## 2022-10-16 DIAGNOSIS — R059 Cough, unspecified: Secondary | ICD-10-CM | POA: Insufficient documentation

## 2022-10-16 DIAGNOSIS — R051 Acute cough: Secondary | ICD-10-CM | POA: Diagnosis not present

## 2022-10-16 NOTE — Assessment & Plan Note (Signed)
Due to taking eliquis he did seek care at ER with bumping head. Advised if he has a head injury with resulting severe headache, or vision change or N/V afterwards, or LOC he should seek care. Otherwise he should be cautious.

## 2022-10-16 NOTE — Progress Notes (Signed)
   Subjective:   Patient ID: Richard [name], male    DOB: [birth year], 76 y.o.   MRN: [id number]  HPI The patient is a 77 YO man coming in for multiple recent ER visits for bumping head and seeking care as he is taking eliquis and has had 3 different CT head recently. He also has new cough in last 3 days. Has not tried anything.  Review of Systems  Constitutional: Negative.   HENT: Negative.    Eyes: Negative.   Respiratory:  Positive for cough. Negative for chest tightness and shortness of breath.   Cardiovascular:  Negative for chest pain, palpitations and leg swelling.  Gastrointestinal:  Negative for abdominal distention, abdominal pain, constipation, diarrhea, nausea and vomiting.  Musculoskeletal: Negative.   Skin: Negative.   Neurological: Negative.   Psychiatric/Behavioral: Negative.      Objective:  Physical Exam Constitutional:      Appearance: He is well-developed.  HENT:     Head: Normocephalic and atraumatic.     Comments: Oropharynx with redness and clear drainage, nose with swollen turbinates, TMs normal bilaterally.  Neck:     Thyroid: No thyromegaly.  Cardiovascular:     Rate and Rhythm: Normal rate and regular rhythm.  Pulmonary:     Effort: Pulmonary effort is normal. No respiratory distress.     Breath sounds: Normal breath sounds. No wheezing or rales.  Abdominal:     General: Bowel sounds are normal. There is no distension.     Palpations: Abdomen is soft.     Tenderness: There is no abdominal tenderness. There is no rebound.  Musculoskeletal:        General: No tenderness.     Cervical back: Normal range of motion.  Lymphadenopathy:     Cervical: No cervical adenopathy.  Skin:    General: Skin is warm and dry.  Neurological:     Mental Status: He is alert and oriented to person, place, and time.     Coordination: Coordination normal.     Vitals:   10/16/22 1538  BP: 120/84  Pulse: 84  Temp: 98.3 F (36.8 C)  TempSrc: Oral  SpO2: 98%   Weight: 162 lb (73.5 kg)  Height: 5\' 10"  (1.778 m)    Assessment & Plan:

## 2022-10-16 NOTE — Patient Instructions (Signed)
Call us on Monday or Tuesday if not improving.

## 2022-10-16 NOTE — Assessment & Plan Note (Signed)
Suspect viral and could have picked up at ER during one of recent visits. Given symptoms 3 days and lungs clear would recommend supportive care for now. Can use mucinex otc. Call back [place] or Tues if not improving.

## 2022-10-18 ENCOUNTER — Encounter: Payer: Self-pay | Admitting: Internal Medicine

## 2022-10-20 MED ORDER — AMOXICILLIN-POT CLAVULANATE 875-125 MG PO TABS: 1.0000 | ORAL_TABLET | Freq: Two times a day (BID) | ORAL | 0 refills | Status: AC

## 2022-10-21 ENCOUNTER — Telehealth: Payer: Self-pay | Admitting: *Deleted

## 2022-10-21 NOTE — Telephone Encounter (Signed)
   Pre-operative Risk Assessment    Patient Name: Richard [name]  DOB: [birth year] MRN: [id number]      Request for Surgical Clearance    Procedure:   Virectomy with Membrane Peel  Date of Surgery:  Clearance 11/11/22                                 Surgeon:  Dr. Arlys John Surgeon's Group or Practice Name:  [facility] Phone number:  [phone] Fax number:  [phone]   Type of Clearance Requested:   - Medical  - Pharmacy:  Hold Apixaban (Eliquis) 3 days prior.    Type of Anesthesia:  MAC   Additional requests/questions:    Signed, Emmit Pomfret   10/21/2022, 10:37 AM

## 2022-10-22 NOTE — Telephone Encounter (Signed)
Patient with diagnosis of afib on Eliquis for anticoagulation.    Procedure: vitrectomy with membrane peel Date of procedure: 11/11/22  CHA2DS2-VASc Score = 2  This indicates a 2.2% annual risk of stroke. The patient's score is based upon: CHF History: 0 HTN History: 1 Diabetes History: 0 Stroke History: 0 Vascular Disease History: 0 Age Score: 1 Gender Score: 0   CrCl 37mL/min Platelet count 185K  3 days seems to be a bit long of a hold request, should likely be ok with 1-2 day Eliquis hold. His CV risk is relatively low though, if his ophthalmologist prefers 3 day hold, would be ok.  **This guidance is not considered finalized until pre-operative APP has relayed final recommendations.**

## 2022-10-22 NOTE — Telephone Encounter (Signed)
Dr. Jens Som, request for clearance for upcoming vitrectomy under MAC. Do you agree that patient is at low risk and does not require further cardiac testing?  Please route your response to p cv div preop.  Thank you, Marcelino Duster

## 2022-10-23 DIAGNOSIS — L814 Other melanin hyperpigmentation: Secondary | ICD-10-CM | POA: Diagnosis not present

## 2022-10-23 DIAGNOSIS — L309 Dermatitis, unspecified: Secondary | ICD-10-CM | POA: Diagnosis not present

## 2022-10-23 DIAGNOSIS — L821 Other seborrheic keratosis: Secondary | ICD-10-CM | POA: Diagnosis not present

## 2022-10-23 DIAGNOSIS — D225 Melanocytic nevi of trunk: Secondary | ICD-10-CM | POA: Diagnosis not present

## 2022-10-23 NOTE — Telephone Encounter (Signed)
   Primary Cardiologist: Olga Millers, MD  Chart reviewed as part of pre-operative protocol coverage. Given past medical history and time since last visit, based on ACC/AHA guidelines, Richard [name] would be at acceptable risk for the planned procedure without further cardiovascular testing.   Patient was advised that if he develops new symptoms prior to surgery to contact our office to arrange a follow-up appointment.  He verbalized understanding.  Recommendation to hold Eliquis 1-2 days,  however if ophthalmologist feels that 3 day hold is necessary, patient has relatively low CV risk. 3 day hold is usually reserved for spinal procedures.   I will route this recommendation to the requesting party via Epic fax function and remove from pre-op pool.  Please call with questions.  Levi Aland, NP-C  10/23/2022, 7:20 AM [address] Office [phone] Fax [phone]

## 2022-10-27 ENCOUNTER — Ambulatory Visit: Payer: Medicare Other | Admitting: Dermatology

## 2022-11-02 ENCOUNTER — Ambulatory Visit: Payer: Medicare Other | Admitting: Podiatry

## 2022-11-02 DIAGNOSIS — Z7901 Long term (current) use of anticoagulants: Secondary | ICD-10-CM

## 2022-11-02 DIAGNOSIS — M79675 Pain in left toe(s): Secondary | ICD-10-CM | POA: Diagnosis not present

## 2022-11-02 DIAGNOSIS — M79674 Pain in right toe(s): Secondary | ICD-10-CM | POA: Diagnosis not present

## 2022-11-02 DIAGNOSIS — B351 Tinea unguium: Secondary | ICD-10-CM

## 2022-11-03 NOTE — Progress Notes (Signed)
Subjective: Chief Complaint  Patient presents with   Nail Problem    Thick painful toenails, 9 week follow up    77 y.o. returns the office today for painful, elongated, thickened toenails which he cannot trim himself. Denies any redness or drainage around the nails.  No open lesions.  No other concerns today.    PCP: Georgianne Fick, MD  He is on Eliquis for A-fib  Objective: AAO 3, NAD DP/PT pulses palpable, CRT less than 3 seconds Nails hypertrophic, dystrophic, elongated, brittle, discolored 10. There is tenderness overlying the nails 1-5 bilaterally. There is no surrounding erythema or drainage along the nail sites.  No open lesions or pre-ulcerative lesions are identified. Hammertoes present, rigid No pain with calf compression, swelling, warmth, erythema.  Assessment: Patient presents with symptomatic onychomycosis, on Eliquis  Plan: -Treatment options including alternatives, risks, complications were discussed -Nails sharply debrided 10 without complication/bleeding. -For the hammertoes we discussed offloading again for inspection to make sure no skin breakdown or signs of infection.  -Discussed daily foot inspection. If there are any changes, to call the office immediately.  -Follow-up as scheduled or sooner if any problems are to arise. In the meantime, encouraged to call the office with any questions, concerns, changes symptoms.  Return in about 9 weeks (around 01/04/2023) for nail trim .  Vivi Barrack DPM

## 2022-11-06 ENCOUNTER — Other Ambulatory Visit: Payer: Self-pay | Admitting: Internal Medicine

## 2022-11-06 DIAGNOSIS — E782 Mixed hyperlipidemia: Secondary | ICD-10-CM

## 2022-11-12 ENCOUNTER — Telehealth: Payer: Self-pay | Admitting: Cardiology

## 2022-11-12 NOTE — Telephone Encounter (Signed)
Patient states Echo scheduled on Monday. States usually has appt after the procedure to speak with him and wife. He thought he had appt on the 17th but none scheduled. He would like an appt with Dr Jens Som as soon as he can after completion of the echo please.  No openings so deferred to nurse to see if available spot for patient

## 2022-11-12 NOTE — Telephone Encounter (Signed)
New Message:      Patient would like for Richard [name] to please give him a call. This is concerning his upcoming and about see Dr Jens Som after his test.

## 2022-11-13 NOTE — Telephone Encounter (Signed)
Spoke with pt, Follow up scheduled  

## 2022-11-16 ENCOUNTER — Ambulatory Visit (HOSPITAL_COMMUNITY): Payer: Medicare Other | Attending: Cardiology

## 2022-11-16 DIAGNOSIS — I341 Nonrheumatic mitral (valve) prolapse: Secondary | ICD-10-CM | POA: Insufficient documentation

## 2022-11-16 LAB — ECHOCARDIOGRAM COMPLETE
Area-P 1/2: 4.1 cm2
MV M vel: 4.21 m/s
MV Peak grad: 70.9 mmHg
S' Lateral: 3.3 cm

## 2022-11-17 ENCOUNTER — Encounter: Payer: Self-pay | Admitting: Cardiology

## 2022-11-17 ENCOUNTER — Ambulatory Visit: Payer: Medicare Other | Attending: Cardiology | Admitting: Cardiology

## 2022-11-17 VITALS — BP 120/70 | HR 70 | Ht 69.5 in | Wt 161.6 lb

## 2022-11-17 DIAGNOSIS — I4821 Permanent atrial fibrillation: Secondary | ICD-10-CM | POA: Diagnosis not present

## 2022-11-17 DIAGNOSIS — E7800 Pure hypercholesterolemia, unspecified: Secondary | ICD-10-CM | POA: Diagnosis not present

## 2022-11-17 DIAGNOSIS — Z136 Encounter for screening for cardiovascular disorders: Secondary | ICD-10-CM

## 2022-11-17 DIAGNOSIS — I10 Essential (primary) hypertension: Secondary | ICD-10-CM | POA: Diagnosis not present

## 2022-11-17 NOTE — Progress Notes (Signed)
HPI: FU atrial fibrillation.  Holter monitor February 2021 showed sinus rhythm with PACs, couplets, bigeminy, atrial run and bradycardia during early a.m. hours. Since having Covid March 2020 he has had some fatigue with activities as well as dyspnea with more vigorous activities relieved with rest.  Abdominal ultrasound August 2021 showed no aneurysm. Patient previously found to be in recurrent atrial fibrillation and there was discussion about rate control versus rhythm control.  He preferred rate control. Echocardiogram repeated May 2024 and showed ejection fraction 60 to 65%, mild left ventricular hypertrophy, moderate left atrial enlargement, severe right atrial enlargement, mild mitral regurgitation, mildly dilated ascending aorta.  Since last seen patient denies dyspnea, chest pain, palpitations or syncope.  No bleeding.  Current Outpatient Medications  Medication Sig Dispense Refill   apixaban (ELIQUIS) 5 MG TABS tablet Take 1 tablet (5 mg total) by mouth 2 (two) times daily. 180 tablet 3   CALCIUM-MAGNESIUM-ZINC PO Take 1 tablet by mouth 2 (two) times daily.      cholecalciferol (VITAMIN D) 1000 UNITS tablet Take 1,000 Units by mouth daily.      Clobetasol Propionate (TEMOVATE) 0.05 % external spray Apply topically 2 (two) times daily. 59 mL 6   Cod Liver Oil CAPS Take 1 capsule by mouth every morning.     cyclobenzaprine (FLEXERIL) 10 MG tablet TAKE 1 TABLET BY MOUTH DAILY AS  NEEDED FOR BACK MUSCLE SPASMS 90 tablet 1   famotidine (PEPCID) 20 MG tablet Take 1 tablet (20 mg total) by mouth daily. 90 tablet 3   fluticasone (FLONASE) 50 MCG/ACT nasal spray Place 2 sprays into both nostrils daily. 48 g 3   hydroxychloroquine (PLAQUENIL) 200 MG tablet Take 200 mg by mouth 2 (two) times daily.     lisinopril-hydrochlorothiazide (ZESTORETIC) 10-12.5 MG tablet Take 1 tablet by mouth every morning. 90 tablet 3   Lutein-Zeaxanthin 25-5 MG CAPS Take 1 capsule by mouth every morning.      misoprostol (CYTOTEC) 100 MCG tablet TAKE 1 TABLET BY MOUTH TWICE  DAILY AS NEEDED 240 tablet 2   Multiple Vitamin (MULTIVITAMIN WITH MINERALS) TABS tablet Take 1 tablet by mouth daily.     pravastatin (PRAVACHOL) 20 MG tablet Take 1 tablet (20 mg total) by mouth daily. 90 tablet 3   triamcinolone cream (KENALOG) 0.1 % Apply 1 application topically daily. Apply to psoriasis rash (Patient taking differently: Apply 1 application  topically daily. Apply to psoriasis rash. Pt takes as needed.) 30 g 1   vitamin B-12 (CYANOCOBALAMIN) 1000 MCG tablet Take 1,000 mcg by mouth daily.     No current facility-administered medications for this visit.     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    Atrial fibrillation (HCC)    Back pain    Dyspnea    history of covid 09/2018 and with extertion    Dysrhythmia    GERD (gastroesophageal reflux disease)    Headache    Hypertension    Pain    PAIN AND OA RT KNEE   Pneumonia    YEARS AGO   Psoriasis    arthritis   Snoring 04/27/2014    Past Surgical History:  Procedure Laterality Date   INGUINAL HERNIA REPAIR Left 01/29/2021   Procedure: LEFT INGUINAL HERNIA REPAIR WITH MESH;  Surgeon: Emelia Loron, MD;  Location:  SURGERY CENTER;  Service: General;  Laterality: Left;   KNEE ARTHROSCOPY Right  01/31/2014   Procedure: RIGHT ARTHROSCOPY KNEE WITH DEBRIDMENT CHONDROPLASTY;  Surgeon: Loanne Drilling, MD;  Location: [facility];  Service: Orthopedics;  Laterality: Right;   LUMP REMOVED FROM LEFT BREAST Left 07/06/2006   STATES SURGERY  AT [facility] - LOCAL- NOT CANCER 5 OR MORE YRS AGO   MORTON'S NEUROMA REMOVED     TONSILLECTOMY     AS A CHILD   TRANSTHORACIC ECHOCARDIOGRAM  07/2020   Normal LV size and function.  GR 1 DD.  Mild RV enlargement.  Moderate LA & RA enlargement.  Mild MR.  MR likely related to mildly for MR/MVP.  Moderate TR, mildly elevated RAP.Marland Kitchen  Moderate ao valve  thickening.  Mild AOV sclerosis but no stenosis.    Social History   Socioeconomic History   Marital status: Married    Spouse name: Not on file   Number of children: 0   Years of education: Not on file   Highest education level: Bachelor's degree (e.g., BA, AB, BS)  Occupational History   Not on file  Tobacco Use   Smoking status: Former    Packs/day: .25    Types: Cigarettes    Quit date: 1990    Years since quitting: 34.3   Smokeless tobacco: Never  Vaping Use   Vaping Use: Never used  Substance and Sexual Activity   Alcohol use: Yes    Comment: QUIT SMOKING ABOUT 15 YRS AGO--WAS ONLY 2 OR 3 CIGARETTES WITH A BEER ON WEEK ENDS   Drug use: No   Sexual activity: Yes  Other Topics Concern   Not on file  Social History Narrative   Right handed, caffeine 2-3 cups daily, married, no kids, has baby bird.  WC back injury, so not working at this time.  BS in econmics.   Social Determinants of Health   Financial Resource Strain: Low Risk  (10/14/2022)   Overall Financial Resource Strain (CARDIA)    Difficulty of Paying Living Expenses: Not hard at all  Food Insecurity: No Food Insecurity (10/14/2022)   Hunger Vital Sign    Worried About Running Out of Food in the Last Year: Never true    Ran Out of Food in the Last Year: Never true  Transportation Needs: No Transportation Needs (10/14/2022)   PRAPARE - Administrator, Civil Service (Medical): No    Lack of Transportation (Non-Medical): No  Physical Activity: Not on file  Stress: Not on file  Social Connections: Unknown (10/14/2022)   Social Connection and Isolation Panel [NHANES]    Frequency of Communication with Friends and Family: More than three times a week    Frequency of Social Gatherings with Friends and Family: Once a week    Attends Religious Services: Patient declined    Database administrator or Organizations: No    Attends Engineer, structural: Not on file    Marital Status: Married   Catering manager Violence: Not on file    Family History  Problem Relation Age of Onset   Diabetes Mother    Hypertension Mother    Heart disease Mother        has a stent    Stroke Father 27   Hypertension Father     ROS: no fevers or chills, productive cough, hemoptysis, dysphasia, odynophagia, melena, hematochezia, dysuria, hematuria, rash, seizure activity, orthopnea, PND, pedal edema, claudication. Remaining systems are negative.  Physical Exam: Well-developed well-nourished in no acute distress.  Skin is warm and dry.  HEENT is  normal.  Neck is supple.  Chest is clear to auscultation with normal expansion.  Cardiovascular exam is irregular Abdominal exam nontender or distended. No masses palpated. Extremities show no edema. neuro grossly intact   A/P  1 atrial fibrillation-plan to continue apixaban.  We have previously discussed Watchman device but he conservative measures at the time.  2 history of mitral valve prolapse with mild mitral regurgitation-follow-up echocardiogram recently showed mild MR.  3 hypertension-patient's blood pressure is controlled.  Continue present medications.  4 hyperlipidemia-continue statin.  Will arrange calcium score at patient request.  If elevated will advance statin.  Olga Millers, MD

## 2022-11-17 NOTE — Patient Instructions (Signed)
    Testing/Procedures:  CORONARY CALCIUM SCORING CT SCAN AT THE [facility]   Follow-Up: At [facility], you and your health needs are our priority.  As part of our continuing mission to provide you with exceptional heart care, we have created designated Provider Care Teams.  These Care Teams include your primary Cardiologist (physician) and Advanced Practice Providers (APPs -  Physician Assistants and Nurse Practitioners) who all work together to provide you with the care you need, when you need it.  We recommend signing up for the patient portal called "MyChart".  Sign up information is provided on this After Visit Summary.  MyChart is used to connect with patients for Virtual Visits (Telemedicine).  Patients are able to view lab/test results, encounter notes, upcoming appointments, etc.  Non-urgent messages can be sent to your provider as well.   To learn more about what you can do with MyChart, go to [email or web address].    Your next appointment:   5 month(s)  Provider:   Olga Millers, MD

## 2022-11-20 ENCOUNTER — Ambulatory Visit: Payer: Medicare Other | Admitting: Cardiology

## 2022-12-03 ENCOUNTER — Other Ambulatory Visit: Payer: Self-pay | Admitting: Internal Medicine

## 2022-12-04 DIAGNOSIS — L4050 Arthropathic psoriasis, unspecified: Secondary | ICD-10-CM | POA: Diagnosis not present

## 2022-12-04 DIAGNOSIS — Z79899 Other long term (current) drug therapy: Secondary | ICD-10-CM | POA: Diagnosis not present

## 2022-12-05 ENCOUNTER — Emergency Department (HOSPITAL_BASED_OUTPATIENT_CLINIC_OR_DEPARTMENT_OTHER)
Admission: EM | Admit: 2022-12-05 | Discharge: 2022-12-05 | Disposition: A | Discharge status: Home / Self Care | Payer: Medicare Other | Attending: Emergency Medicine | Admitting: Emergency Medicine

## 2022-12-05 ENCOUNTER — Encounter (HOSPITAL_BASED_OUTPATIENT_CLINIC_OR_DEPARTMENT_OTHER): Payer: Self-pay | Admitting: Emergency Medicine

## 2022-12-05 ENCOUNTER — Other Ambulatory Visit: Payer: Self-pay

## 2022-12-05 ENCOUNTER — Emergency Department (HOSPITAL_BASED_OUTPATIENT_CLINIC_OR_DEPARTMENT_OTHER): Payer: Medicare Other

## 2022-12-05 DIAGNOSIS — W228XXA Striking against or struck by other objects, initial encounter: Secondary | ICD-10-CM | POA: Insufficient documentation

## 2022-12-05 DIAGNOSIS — Z91040 Latex allergy status: Secondary | ICD-10-CM | POA: Diagnosis not present

## 2022-12-05 DIAGNOSIS — Z7901 Long term (current) use of anticoagulants: Secondary | ICD-10-CM | POA: Insufficient documentation

## 2022-12-05 DIAGNOSIS — I4891 Unspecified atrial fibrillation: Secondary | ICD-10-CM | POA: Diagnosis not present

## 2022-12-05 DIAGNOSIS — S0990XA Unspecified injury of head, initial encounter: Secondary | ICD-10-CM | POA: Insufficient documentation

## 2022-12-05 DIAGNOSIS — R519 Headache, unspecified: Secondary | ICD-10-CM | POA: Diagnosis not present

## 2022-12-05 MED ORDER — ACETAMINOPHEN 500 MG PO TABS
500.0000 mg | ORAL_TABLET | Freq: Once | ORAL | Status: AC
  Administered 2022-12-05: 500 mg via ORAL
  Filled 2022-12-05: qty 1

## 2022-12-05 NOTE — ED Triage Notes (Signed)
Hit head left side near temple x 2 yesterday. Today notice headache above right eye that started last night early evening. Takes eliquis.

## 2022-12-05 NOTE — Discharge Instructions (Signed)
I recommend taking Tylenol 500 mg every 6 hours for couple of doses.  ED of worsening eye pain, headache, any episodes of emesis/vomiting or any other new or concerning symptoms please return to the emergency room.

## 2022-12-05 NOTE — ED Provider Notes (Signed)
[facility] AT [facility] Provider Note   CSN: [id number] Arrival date & time: 12/05/22  1609     History  Chief Complaint  Patient presents with   Head Injury    Richard [name] is a 77 y.o. male.   Head Injury  Patient is a 77 year old male present emergency room today after head injury yesterday he states that he is on Eliquis for atrial fibrillation and yesterday struck the left side of his head twice.  He states he developed a headache but seems to be primarily right frontal temporal has been constant since but improved after some Tylenol this morning he took about 500 mg.  He denies any nausea vomiting confusion slurred speech lightheadedness dizziness and he states that his headache is mostly improved he states that his mild and noticeable but not severe.  He states he feels generally well       Home Medications Prior to Admission medications   Medication Sig Start Date End Date Taking? Authorizing Provider  apixaban (ELIQUIS) 5 MG TABS tablet Take 1 tablet (5 mg total) by mouth 2 (two) times daily. 04/08/22   Myrlene Broker, MD  CALCIUM-MAGNESIUM-ZINC PO Take 1 tablet by mouth 2 (two) times daily.     [provider]  cholecalciferol (VITAMIN D) 1000 UNITS tablet Take 1,000 Units by mouth daily.     [provider]  Clobetasol Propionate (TEMOVATE) 0.05 % external spray Apply topically 2 (two) times daily. 10/27/21   Janalyn Harder, MD  Cod Liver Oil CAPS Take 1 capsule by mouth every morning.    [provider]  cyclobenzaprine (FLEXERIL) 10 MG tablet TAKE 1 TABLET BY MOUTH DAILY AS  NEEDED FOR BACK MUSCLE SPASMS 07/20/22   Myrlene Broker, MD  famotidine (PEPCID) 20 MG tablet Take 1 tablet (20 mg total) by mouth daily. 07/17/22   Myrlene Broker, MD  fluticasone Aleda Grana) 50 MCG/ACT nasal spray Place 2 sprays into both nostrils daily. 08/28/22   Myrlene Broker, MD  hydroxychloroquine (PLAQUENIL)  200 MG tablet Take 200 mg by mouth 2 (two) times daily.    [provider]  lisinopril-hydrochlorothiazide (ZESTORETIC) 10-12.5 MG tablet TAKE 1 TABLET BY MOUTH EVERY  MORNING 12/04/22   Myrlene Broker, MD  Lutein-Zeaxanthin 25-5 MG CAPS Take 1 capsule by mouth every morning.    [provider]  misoprostol (CYTOTEC) 100 MCG tablet TAKE 1 TABLET BY MOUTH TWICE  DAILY AS NEEDED 10/01/22   Myrlene Broker, MD  Multiple Vitamin (MULTIVITAMIN WITH MINERALS) TABS tablet Take 1 tablet by mouth daily.    [provider]  pravastatin (PRAVACHOL) 20 MG tablet Take 1 tablet (20 mg total) by mouth daily. 04/08/22   Myrlene Broker, MD  triamcinolone cream (KENALOG) 0.1 % Apply 1 application topically daily. Apply to psoriasis rash Patient taking differently: Apply 1 application  topically daily. Apply to psoriasis rash. Pt takes as needed. 06/20/19   Vivi Barrack, DPM  vitamin B-12 (CYANOCOBALAMIN) 1000 MCG tablet Take 1,000 mcg by mouth daily.    [provider]      Allergies    Azithromycin, Latex, and Prednisone    Review of Systems   Review of Systems  Physical Exam Updated Vital Signs BP 121/82   Pulse 78   Temp 98.1 F (36.7 C) (Oral)   Resp 16   SpO2 100%  Physical Exam Vitals and nursing note reviewed.  Constitutional:      General: He  is not in acute distress.    Comments: Pleasant well-appearing 77 year old.  In no acute distress.  Sitting comfortably in bed.  Able answer questions appropriately follow commands. No increased work of breathing. Speaking in full sentences.   HENT:     Head: Normocephalic and atraumatic.     Nose: Nose normal.     Mouth/Throat:     Mouth: Mucous membranes are moist.  Eyes:     General: No scleral icterus. Cardiovascular:     Rate and Rhythm: Normal rate and regular rhythm.     Pulses: Normal pulses.     Heart sounds: Normal heart sounds.  Pulmonary:     Effort: Pulmonary effort is  normal. No respiratory distress.     Breath sounds: No wheezing.  Abdominal:     General: Abdomen is flat.  Musculoskeletal:     Cervical back: Normal range of motion.     Right lower leg: No edema.     Left lower leg: No edema.     Comments: No bony tenderness over joints or long bones of the upper and lower extremities.    No neck or back midline tenderness, step-off, deformity, or bruising. Able to turn head left and right 45 degrees without difficulty.  Full range of motion of upper and lower extremity joints shown after palpation was conducted; with 5/5 symmetrical strength in upper and lower extremities. No chest wall tenderness, no facial or cranial tenderness.   Patient has intact sensation grossly in lower and upper extremities. Intact patellar and ankle reflexes. Patient able to ambulate without difficulty.  Radial and DP pulses palpated BL.    Skin:    General: Skin is warm and dry.     Capillary Refill: Capillary refill takes less than 2 seconds.  Neurological:     Mental Status: He is alert. Mental status is at baseline.     Comments: Alert and oriented to self, place, time and event.   Speech is fluent, clear without dysarthria or dysphasia.   Strength 5/5 in upper/lower extremities   Sensation intact in upper/lower extremities   Normal gait.  CN I not tested  CN II grossly intact visual fields bilaterally. Did not visualize posterior eye.  CN III, IV, VI PERRLA and EOMs intact bilaterally  CN V Intact sensation to sharp and light touch to the face  CN VII facial movements symmetric  CN VIII not tested  CN IX, X no uvula deviation, symmetric rise of soft palate  CN XI 5/5 SCM and trapezius strength bilaterally  CN XII Midline tongue protrusion, symmetric L/R movements   Psychiatric:        Mood and Affect: Mood normal.        Behavior: Behavior normal.     ED Results / Procedures / Treatments   Labs (all labs ordered are listed, but only abnormal results  are displayed) Labs Reviewed - No data to display  EKG None  Radiology No results found.  Procedures Procedures    Medications Ordered in ED Medications  acetaminophen (TYLENOL) tablet 500 mg (500 mg Oral Given 12/05/22 1707)    ED Course/ Medical Decision Making/ A&P                             Medical Decision Making Amount and/or Complexity of Data Reviewed Radiology: ordered.  Risk OTC drugs.   Patient is a 77 year old male present emergency room today after head injury yesterday  he states that he is on Eliquis for atrial fibrillation and yesterday struck the left side of his head twice.  He states he developed a headache but seems to be primarily right frontal temporal has been constant since but improved after some Tylenol this morning he took about 500 mg.  He denies any nausea vomiting confusion slurred speech lightheadedness dizziness and he states that his headache is mostly improved he states that his mild and noticeable but not severe.  He states he feels generally well  Patient's physical exam is unremarkable from a trauma and neurologic standpoint he is reassuringly normal.  Will provide him with a small dose of Tylenol and obtain a CT head  CT head unremarkable.  I reevaluated patient he has only had Tylenol approximately 45 minutes ago but states he already is completely symptom-free.  Denies any nausea blurry vision double vision slurred speech confusion.  His wife states he is at his mental baseline he is alert and oriented and well-appearing he has no symptoms here and his vital signs are normal  Given his normal CT head and complete resolution of symptoms I think it is reasonable to send patient home to follow-up with his primary care doctor.  Return precautions provided to patient he is agreeable to plan.  Will discharge home at this time he is ambulating at this time without difficulty   Final Clinical Impression(s) / ED Diagnoses Final diagnoses:   Injury of head, initial encounter    Rx / DC Orders ED Discharge Orders     None         Gailen Shelter, [place] 12/05/22 1929    Derwood Kaplan, MD 12/06/22 1635

## 2022-12-07 DIAGNOSIS — Z79899 Other long term (current) drug therapy: Secondary | ICD-10-CM | POA: Diagnosis not present

## 2022-12-07 DIAGNOSIS — L4059 Other psoriatic arthropathy: Secondary | ICD-10-CM | POA: Diagnosis not present

## 2022-12-07 DIAGNOSIS — Z6823 Body mass index (BMI) 23.0-23.9, adult: Secondary | ICD-10-CM | POA: Diagnosis not present

## 2022-12-07 DIAGNOSIS — L409 Psoriasis, unspecified: Secondary | ICD-10-CM | POA: Diagnosis not present

## 2022-12-07 DIAGNOSIS — M1991 Primary osteoarthritis, unspecified site: Secondary | ICD-10-CM | POA: Diagnosis not present

## 2022-12-09 ENCOUNTER — Telehealth: Payer: Self-pay

## 2022-12-09 NOTE — Telephone Encounter (Signed)
Transition Care Management Follow-up Telephone Call Date of discharge and from where: [facility] 6/1 How have you been since you were released from the hospital? Doing great Any questions or concerns? No  Items Reviewed: Did the pt receive and understand the discharge instructions provided? Yes  Medications obtained and verified? Yes  Other? No  Any new allergies since your discharge? No  Dietary orders reviewed? No Do you have support at home? Yes     Follow up appointments reviewed:  PCP Hospital f/u appt confirmed? Yes  Scheduled to see  on  @ . Specialist Hospital f/u appt confirmed? No  Scheduled to see  on  @ . Are transportation arrangements needed? No  If their condition worsens, is the pt aware to call PCP or go to the Emergency Dept.? Yes Was the patient provided with contact information for the PCP's office or ED? Yes Was to pt encouraged to call back with questions or concerns? Yes

## 2022-12-16 ENCOUNTER — Other Ambulatory Visit: Payer: Self-pay | Admitting: Internal Medicine

## 2022-12-16 DIAGNOSIS — M170 Bilateral primary osteoarthritis of knee: Secondary | ICD-10-CM | POA: Diagnosis not present

## 2022-12-18 ENCOUNTER — Other Ambulatory Visit: Payer: Self-pay | Admitting: Internal Medicine

## 2022-12-18 DIAGNOSIS — I4891 Unspecified atrial fibrillation: Secondary | ICD-10-CM

## 2022-12-25 ENCOUNTER — Encounter: Payer: Self-pay | Admitting: Internal Medicine

## 2022-12-28 ENCOUNTER — Ambulatory Visit: Payer: Medicare Other | Admitting: Dermatology

## 2023-01-10 ENCOUNTER — Emergency Department (HOSPITAL_BASED_OUTPATIENT_CLINIC_OR_DEPARTMENT_OTHER)
Admission: EM | Admit: 2023-01-10 | Discharge: 2023-01-10 | Disposition: A | Discharge status: Home / Self Care | Payer: Medicare Other | Attending: Emergency Medicine | Admitting: Emergency Medicine

## 2023-01-10 ENCOUNTER — Encounter (HOSPITAL_BASED_OUTPATIENT_CLINIC_OR_DEPARTMENT_OTHER): Payer: Self-pay

## 2023-01-10 ENCOUNTER — Emergency Department (HOSPITAL_BASED_OUTPATIENT_CLINIC_OR_DEPARTMENT_OTHER): Payer: Medicare Other

## 2023-01-10 DIAGNOSIS — W228XXA Striking against or struck by other objects, initial encounter: Secondary | ICD-10-CM | POA: Diagnosis not present

## 2023-01-10 DIAGNOSIS — Z91040 Latex allergy status: Secondary | ICD-10-CM | POA: Insufficient documentation

## 2023-01-10 DIAGNOSIS — Z7901 Long term (current) use of anticoagulants: Secondary | ICD-10-CM | POA: Insufficient documentation

## 2023-01-10 DIAGNOSIS — R519 Headache, unspecified: Secondary | ICD-10-CM | POA: Diagnosis not present

## 2023-01-10 DIAGNOSIS — S0990XA Unspecified injury of head, initial encounter: Secondary | ICD-10-CM | POA: Diagnosis not present

## 2023-01-10 DIAGNOSIS — S0003XA Contusion of scalp, initial encounter: Secondary | ICD-10-CM | POA: Diagnosis not present

## 2023-01-10 NOTE — ED Triage Notes (Signed)
Pt states he hit his head while trying to adjust the back seat of the car yesterday. Denies LOC. Pt states he had a headache intermittently today, as well as some queasy-ness that has subsided.  Last dose of Eliquis this morning.

## 2023-01-10 NOTE — ED Notes (Signed)
Dc instructions reviewed with patient. Patient voiced understanding. Dc with belongings.  °

## 2023-01-10 NOTE — ED Provider Notes (Signed)
[place] EMERGENCY DEPARTMENT AT [facility] Provider Note   CSN: [id number] Arrival date & time: 01/10/23  1617     History  Chief Complaint  Patient presents with   Head Injury    Clearance Richard [name] is a 77 y.o. male.  Patient here with headache after hitting his head a couple days ago.  Not having any symptoms now but takes Eliquis.  Want to get head CT.  He denies any neck pain or ongoing nausea or vomiting or diarrhea.  Did not hit his head or lose consciousness.  Has no chest pain or shortness of breath.  Nothing makes it worse or better.  The history is provided by the patient.       Home Medications Prior to Admission medications   Medication Sig Start Date End Date Taking? Authorizing Provider  CALCIUM-MAGNESIUM-ZINC PO Take 1 tablet by mouth 2 (two) times daily.     [provider]  cholecalciferol (VITAMIN D) 1000 UNITS tablet Take 1,000 Units by mouth daily.     [provider]  Clobetasol Propionate (TEMOVATE) 0.05 % external spray Apply topically 2 (two) times daily. 10/27/21   Janalyn Harder, MD  Cod Liver Oil CAPS Take 1 capsule by mouth every morning.    [provider]  cyclobenzaprine (FLEXERIL) 10 MG tablet TAKE 1 TABLET BY MOUTH DAILY AS  NEEDED FOR BACK MUSCLE SPASMS 07/20/22   Myrlene Broker, MD  ELIQUIS 5 MG TABS tablet TAKE 1 TABLET BY MOUTH TWICE  DAILY 12/21/22   Myrlene Broker, MD  famotidine (PEPCID) 20 MG tablet Take 1 tablet (20 mg total) by mouth daily. 07/17/22   Myrlene Broker, MD  fluticasone Aleda Grana) 50 MCG/ACT nasal spray Place 2 sprays into both nostrils daily. 08/28/22   Myrlene Broker, MD  hydroxychloroquine (PLAQUENIL) 200 MG tablet Take 200 mg by mouth 2 (two) times daily.    [provider]  lisinopril-hydrochlorothiazide (ZESTORETIC) 10-12.5 MG tablet TAKE 1 TABLET BY MOUTH EVERY  MORNING 12/04/22   Myrlene Broker, MD  Lutein-Zeaxanthin 25-5 MG CAPS Take 1 capsule  by mouth every morning.    [provider]  misoprostol (CYTOTEC) 100 MCG tablet TAKE 1 TABLET BY MOUTH TWICE  DAILY AS NEEDED 10/01/22   Myrlene Broker, MD  Multiple Vitamin (MULTIVITAMIN WITH MINERALS) TABS tablet Take 1 tablet by mouth daily.    [provider]  pravastatin (PRAVACHOL) 20 MG tablet TAKE 1 TABLET BY MOUTH DAILY 12/17/22   Myrlene Broker, MD  triamcinolone cream (KENALOG) 0.1 % Apply 1 application topically daily. Apply to psoriasis rash Patient taking differently: Apply 1 application  topically daily. Apply to psoriasis rash. Pt takes as needed. 06/20/19   Vivi Barrack, DPM  vitamin B-12 (CYANOCOBALAMIN) 1000 MCG tablet Take 1,000 mcg by mouth daily.    [provider]      Allergies    Azithromycin, Latex, and Prednisone    Review of Systems   Review of Systems  Physical Exam Updated Vital Signs BP 109/72 (BP Location: Right Arm)   Pulse 88   Temp 98.2 F (36.8 C)   Resp 16   Ht 5\' 9"  (1.753 m)   Wt 72.6 kg   SpO2 99%   BMI 23.63 kg/m  Physical Exam Vitals and nursing note reviewed.  Constitutional:      General: He is not in acute distress.    Appearance: He is well-developed. He is not ill-appearing.  HENT:  Head: Normocephalic and atraumatic.     Nose: Nose normal.     Mouth/Throat:     Mouth: Mucous membranes are moist.  Eyes:     Extraocular Movements: Extraocular movements intact.     Conjunctiva/sclera: Conjunctivae normal.     Pupils: Pupils are equal, round, and reactive to light.  Cardiovascular:     Rate and Rhythm: Normal rate and regular rhythm.     Pulses: Normal pulses.     Heart sounds: Normal heart sounds. No murmur heard. Pulmonary:     Effort: Pulmonary effort is normal. No respiratory distress.     Breath sounds: Normal breath sounds.  Abdominal:     Palpations: Abdomen is soft.     Tenderness: There is no abdominal tenderness.  Musculoskeletal:        General: No swelling.  Normal range of motion.     Cervical back: Normal range of motion and neck supple.  Skin:    General: Skin is warm and dry.     Capillary Refill: Capillary refill takes less than 2 seconds.  Neurological:     General: No focal deficit present.     Mental Status: He is alert and oriented to person, place, and time.     Cranial Nerves: No cranial nerve deficit.     Sensory: No sensory deficit.     Motor: No weakness.     Coordination: Coordination normal.     Comments: 5+ out of 5 strength throughout, normal sensation, no drift, normal finger-nose-finger, normal speech  Psychiatric:        Mood and Affect: Mood normal.     ED Results / Procedures / Treatments   Labs (all labs ordered are listed, but only abnormal results are displayed) Labs Reviewed - No data to display  EKG None  Radiology CT Head Wo Contrast  Result Date: 01/10/2023 CLINICAL DATA:  Head injury yesterday with persistent headache today. EXAM: CT HEAD WITHOUT CONTRAST TECHNIQUE: Contiguous axial images were obtained from the base of the skull through the vertex without intravenous contrast. RADIATION DOSE REDUCTION: This exam was performed according to the departmental dose-optimization program which includes automated exposure control, adjustment of the mA and/or kV according to patient size and/or use of iterative reconstruction technique. COMPARISON:  12/05/2022 FINDINGS: Brain: There is no evidence for acute hemorrhage, hydrocephalus, mass lesion, or abnormal extra-axial fluid collection. No definite CT evidence for acute infarction. Vascular: No hyperdense vessel or unexpected calcification. Skull: No evidence for fracture. No worrisome lytic or sclerotic lesion. Sinuses/Orbits: The visualized paranasal sinuses and mastoid air cells are clear. Visualized portions of the globes and intraorbital fat are unremarkable. Other: None. IMPRESSION: Unremarkable head CT. No acute intracranial abnormality. Electronically Signed    By: Kennith Center M.D.   On: 01/10/2023 17:37    Procedures Procedures    Medications Ordered in ED Medications - No data to display  ED Course/ Medical Decision Making/ A&P                             Medical Decision Making Amount and/or Complexity of Data Reviewed Radiology: ordered.   Richard [name] is here after hitting his head 2 days ago now with headaches but he feels okay but he is on Eliquis and wanted to get a head CT.  Head CT was normal.  He is very well-appearing.  He has no neck pain.  No other signs of trauma.  He is  fairly asymptomatic.  Discharged in good condition.  This chart was dictated using voice recognition software.  Despite best efforts to proofread,  errors can occur which can change the documentation meaning.         Final Clinical Impression(s) / ED Diagnoses Final diagnoses:  Injury of head, initial encounter    Rx / DC Orders ED Discharge Orders     None         Virgina Norfolk, DO 01/10/23 1744

## 2023-01-12 ENCOUNTER — Ambulatory Visit: Payer: Medicare Other | Admitting: Podiatry

## 2023-01-15 ENCOUNTER — Ambulatory Visit: Payer: Medicare Other | Admitting: Podiatry

## 2023-01-15 ENCOUNTER — Telehealth: Payer: Self-pay

## 2023-01-15 NOTE — Telephone Encounter (Signed)
Transition Care Management Follow-up Telephone Call Date of discharge and from where: 01/10/2023 [facility] How have you been since you were released from the hospital? Patient stated he is feeling fine. Any questions or concerns? No  Items Reviewed: Did the pt receive and understand the discharge instructions provided? Yes  Medications obtained and verified? Yes  Other? No  Any new allergies since your discharge? No  Dietary orders reviewed? Yes Do you have support at home? Yes   Follow up appointments reviewed:  PCP Hospital f/u appt confirmed? No  Scheduled to see  on  @ . Specialist Hospital f/u appt confirmed? No  Scheduled to see  on  @ . Are transportation arrangements needed? No  If their condition worsens, is the pt aware to call PCP or go to the Emergency Dept.? Yes Was the patient provided with contact information for the PCP's office or ED? Yes Was to pt encouraged to call back with questions or concerns? Yes  Bobbyjo Marulanda [facility]  [facility] Population Health Community Resource Care Guide   [email or web address]  ?? [phone]   Website: [email or web address]  [email or web address]

## 2023-01-25 ENCOUNTER — Other Ambulatory Visit: Payer: Self-pay

## 2023-01-25 ENCOUNTER — Encounter (HOSPITAL_BASED_OUTPATIENT_CLINIC_OR_DEPARTMENT_OTHER): Payer: Self-pay

## 2023-01-25 ENCOUNTER — Emergency Department (HOSPITAL_BASED_OUTPATIENT_CLINIC_OR_DEPARTMENT_OTHER): Payer: Medicare Other

## 2023-01-25 ENCOUNTER — Emergency Department (HOSPITAL_BASED_OUTPATIENT_CLINIC_OR_DEPARTMENT_OTHER)
Admission: EM | Admit: 2023-01-25 | Discharge: 2023-01-25 | Disposition: A | Discharge status: Home / Self Care | Payer: Medicare Other | Attending: Emergency Medicine | Admitting: Emergency Medicine

## 2023-01-25 DIAGNOSIS — I4891 Unspecified atrial fibrillation: Secondary | ICD-10-CM | POA: Diagnosis not present

## 2023-01-25 DIAGNOSIS — I10 Essential (primary) hypertension: Secondary | ICD-10-CM | POA: Diagnosis not present

## 2023-01-25 DIAGNOSIS — W268XXA Contact with other sharp object(s), not elsewhere classified, initial encounter: Secondary | ICD-10-CM | POA: Diagnosis not present

## 2023-01-25 DIAGNOSIS — Z91040 Latex allergy status: Secondary | ICD-10-CM | POA: Insufficient documentation

## 2023-01-25 DIAGNOSIS — S0990XA Unspecified injury of head, initial encounter: Secondary | ICD-10-CM | POA: Diagnosis not present

## 2023-01-25 DIAGNOSIS — Z7901 Long term (current) use of anticoagulants: Secondary | ICD-10-CM

## 2023-01-25 MED ORDER — ACETAMINOPHEN 500 MG PO TABS
ORAL_TABLET | ORAL | Status: AC
  Filled 2023-01-25: qty 1

## 2023-01-25 MED ORDER — ACETAMINOPHEN 500 MG PO TABS
500.0000 mg | ORAL_TABLET | Freq: Once | ORAL | Status: AC
  Administered 2023-01-25: 500 mg via ORAL

## 2023-01-25 NOTE — ED Provider Notes (Signed)
[place] EMERGENCY DEPARTMENT AT [facility] Provider Note   CSN: [id number] Arrival date & time: 01/25/23  1430    History  Chief Complaint  Patient presents with   Head Injury    Richard [name] is a 77 y.o. male history of A-fib on anticoagulation, hypertension here for evaluation of head injury.  Was at [employer] earlier today bent down hit the top aspect of his head on a fruit display. Suffered a contusion to the top aspect of his scalp.  He was told he needs a CT scan since he is on Eliquis.  Has a mild headache.  No vision changes, numbness, weakness, neck pain.  Did not fall to the ground.  Did not hit his eyes.  No lightheadedness, dizziness, syncope.  HPI     Home Medications Prior to Admission medications   Medication Sig Start Date End Date Taking? Authorizing Provider  CALCIUM-MAGNESIUM-ZINC PO Take 1 tablet by mouth 2 (two) times daily.     [provider]  cholecalciferol (VITAMIN D) 1000 UNITS tablet Take 1,000 Units by mouth daily.     [provider]  Clobetasol Propionate (TEMOVATE) 0.05 % external spray Apply topically 2 (two) times daily. 10/27/21   Janalyn Harder, MD  Cod Liver Oil CAPS Take 1 capsule by mouth every morning.    [provider]  cyclobenzaprine (FLEXERIL) 10 MG tablet TAKE 1 TABLET BY MOUTH DAILY AS  NEEDED FOR BACK MUSCLE SPASMS 07/20/22   Myrlene Broker, MD  ELIQUIS 5 MG TABS tablet TAKE 1 TABLET BY MOUTH TWICE  DAILY 12/21/22   Myrlene Broker, MD  famotidine (PEPCID) 20 MG tablet Take 1 tablet (20 mg total) by mouth daily. 07/17/22   Myrlene Broker, MD  fluticasone Aleda Grana) 50 MCG/ACT nasal spray Place 2 sprays into both nostrils daily. 08/28/22   Myrlene Broker, MD  hydroxychloroquine (PLAQUENIL) 200 MG tablet Take 200 mg by mouth 2 (two) times daily.    [provider]  lisinopril-hydrochlorothiazide (ZESTORETIC) 10-12.5 MG tablet TAKE 1 TABLET BY MOUTH EVERY  MORNING  12/04/22   Myrlene Broker, MD  Lutein-Zeaxanthin 25-5 MG CAPS Take 1 capsule by mouth every morning.    [provider]  misoprostol (CYTOTEC) 100 MCG tablet TAKE 1 TABLET BY MOUTH TWICE  DAILY AS NEEDED 10/01/22   Myrlene Broker, MD  Multiple Vitamin (MULTIVITAMIN WITH MINERALS) TABS tablet Take 1 tablet by mouth daily.    [provider]  pravastatin (PRAVACHOL) 20 MG tablet TAKE 1 TABLET BY MOUTH DAILY 12/17/22   Myrlene Broker, MD  triamcinolone cream (KENALOG) 0.1 % Apply 1 application topically daily. Apply to psoriasis rash Patient taking differently: Apply 1 application  topically daily. Apply to psoriasis rash. Pt takes as needed. 06/20/19   Vivi Barrack, DPM  vitamin B-12 (CYANOCOBALAMIN) 1000 MCG tablet Take 1,000 mcg by mouth daily.    [provider]      Allergies    Azithromycin, Latex, and Prednisone    Review of Systems   Review of Systems  Constitutional: Negative.   HENT: Negative.    Respiratory: Negative.    Cardiovascular: Negative.   Gastrointestinal: Negative.   Genitourinary: Negative.   Musculoskeletal: Negative.   Skin: Negative.   Neurological: Negative.   All other systems reviewed and are negative.   Physical Exam Updated Vital Signs BP 112/73 (BP Location: Left Arm)   Pulse 69   Temp (!) 97.2 F (36.2 C) (Temporal)  Resp 18   Ht 5\' 9"  (1.753 m)   Wt 72.6 kg   SpO2 99%   BMI 23.63 kg/m  Physical Exam Vitals and nursing note reviewed.  Constitutional:      General: He is not in acute distress.    Appearance: He is well-developed. He is not ill-appearing, toxic-appearing or diaphoretic.  HENT:     Head: Normocephalic.     Comments: 2mm punctate abrasion to scalp. No active bleeding.  No hematoma, raccoon eyes    Nose: Nose normal.     Mouth/Throat:     Mouth: Mucous membranes are moist.  Eyes:     General:        Right eye: No foreign body, discharge or hordeolum.        Left eye: No  foreign body, discharge or hordeolum.     Extraocular Movements: Extraocular movements intact.     Conjunctiva/sclera: Conjunctivae normal.     Pupils: Pupils are equal, round, and reactive to light.     Comments: Small pterygium left aspect left eye at 3 oclock.  No Traumatic hyphema  Cardiovascular:     Rate and Rhythm: Normal rate and regular rhythm.     Pulses: Normal pulses.     Heart sounds: Normal heart sounds.  Pulmonary:     Effort: Pulmonary effort is normal. No respiratory distress.     Breath sounds: Normal breath sounds.  Abdominal:     General: Bowel sounds are normal. There is no distension.     Palpations: Abdomen is soft.  Musculoskeletal:        General: Normal range of motion.     Cervical back: Normal range of motion and neck supple.  Skin:    General: Skin is warm and dry.     Capillary Refill: Capillary refill takes less than 2 seconds.  Neurological:     General: No focal deficit present.     Mental Status: He is alert and oriented to person, place, and time.    ED Results / Procedures / Treatments   Labs (all labs ordered are listed, but only abnormal results are displayed) Labs Reviewed - No data to display  EKG None  Radiology CT Head Wo Contrast  Result Date: 01/25/2023 CLINICAL DATA:  Trauma EXAM: CT HEAD WITHOUT CONTRAST TECHNIQUE: Contiguous axial images were obtained from the base of the skull through the vertex without intravenous contrast. RADIATION DOSE REDUCTION: This exam was performed according to the departmental dose-optimization program which includes automated exposure control, adjustment of the mA and/or kV according to patient size and/or use of iterative reconstruction technique. COMPARISON:  01/10/2023 FINDINGS: Brain: No acute intracranial findings are seen. There are no signs of bleeding within the cranium. Ventricles are unremarkable. Cortical sulci are prominent. Vascular: Unremarkable. Skull: No fracture is seen in calvarium.  Sinuses/Orbits: Unremarkable. Other: No interval changes are noted. IMPRESSION: No acute intracranial findings are seen in noncontrast CT brain. Electronically Signed   By: Ernie Avena M.D.   On: 01/25/2023 15:31    Procedures Procedures    Medications Ordered in ED Medications  acetaminophen (TYLENOL) 500 MG tablet (  Not Given 01/25/23 1533)  acetaminophen (TYLENOL) tablet 500 mg (500 mg Oral Given 01/25/23 1521)   ED Course/ Medical Decision Making/ A&P   77 year old chronic anticoagulation here for evaluation of head injury.  Bent down hit the top aspect of his head on a fruit display.  He has a nonfocal neuroexam without deficits.  He has no  lacerations requiring suturing.  No evidence of ocular injury.  Imaging personally viewed and interpreted:  CT scan head without significant findings  Discussed results with patient.  DC home with symptomatic management.  The patient has been appropriately medically screened and/or stabilized in the ED. I have low suspicion for any other emergent medical condition which would require further screening, evaluation or treatment in the ED or require inpatient management.  Patient is hemodynamically stable and in no acute distress.  Patient able to ambulate in department prior to ED.  Evaluation does not show acute pathology that would require ongoing or additional emergent interventions while in the emergency department or further inpatient treatment.  I have discussed the diagnosis with the patient and answered all questions.  Pain is been managed while in the emergency department and patient has no further complaints prior to discharge.  Patient is comfortable with plan discussed in room and is stable for discharge at this time.  I have discussed strict return precautions for returning to the emergency department.  Patient was encouraged to follow-up with PCP/specialist refer to at discharge.                            Medical Decision  Making Amount and/or Complexity of Data Reviewed Independent Historian: friend External Data Reviewed: labs, radiology and notes. Radiology: ordered and independent interpretation performed. Decision-making details documented in ED Course.  Risk OTC drugs. Prescription drug management.          Final Clinical Impression(s) / ED Diagnoses Final diagnoses:  Injury of head, initial encounter  Chronic anticoagulation    Rx / DC Orders ED Discharge Orders     None         Shailey Butterbaugh A, PA-C 01/25/23 1549    Cathren Laine, MD 01/26/23 1436

## 2023-01-25 NOTE — Discharge Instructions (Signed)
Tylenol or Ibuprofen as needed for pain  Return for new or worsening symptoms

## 2023-01-25 NOTE — ED Notes (Signed)
 RN reviewed discharge instructions with pt. Pt verbalized understanding and had no further questions. VSS upon discharge.  

## 2023-01-25 NOTE — ED Triage Notes (Signed)
Patient here POV from [employer].  Hit his Head on the [employer] 30 Minutes ago. No LOC. Some Bruising noted. Takes Eliquis.   NAD Noted during triage. A&Ox4. GCS 15. Ambulatory.

## 2023-01-27 ENCOUNTER — Ambulatory Visit (HOSPITAL_BASED_OUTPATIENT_CLINIC_OR_DEPARTMENT_OTHER)
Admission: RE | Admit: 2023-01-27 | Discharge: 2023-01-27 | Disposition: A | Discharge status: Home / Self Care | Payer: Medicare Other | Source: Ambulatory Visit | Attending: Cardiology | Admitting: Cardiology

## 2023-01-27 DIAGNOSIS — Z136 Encounter for screening for cardiovascular disorders: Secondary | ICD-10-CM | POA: Insufficient documentation

## 2023-01-28 ENCOUNTER — Telehealth: Payer: Self-pay | Admitting: *Deleted

## 2023-01-28 DIAGNOSIS — E7800 Pure hypercholesterolemia, unspecified: Secondary | ICD-10-CM

## 2023-01-28 MED ORDER — ROSUVASTATIN CALCIUM 40 MG PO TABS
40.0000 mg | ORAL_TABLET | Freq: Every day | ORAL | 3 refills | Status: DC
Start: 2023-01-28 — End: 2023-12-01

## 2023-01-28 NOTE — Telephone Encounter (Signed)
-----   Message from Olga Millers sent at 01/28/2023  7:25 AM EDT ----- DC pravastatin.  Begin Crestor 40 mg daily.  Check lipids and liver in 8 weeks.  CTA of thoracic aorta in 1 year. Olga Millers

## 2023-01-28 NOTE — Telephone Encounter (Signed)
pt aware of results  New script sent to the pharmacy  Lab orders mailed to the pt  

## 2023-02-01 ENCOUNTER — Emergency Department (HOSPITAL_BASED_OUTPATIENT_CLINIC_OR_DEPARTMENT_OTHER)
Admission: EM | Admit: 2023-02-01 | Discharge: 2023-02-01 | Disposition: A | Discharge status: Home / Self Care | Payer: Medicare Other | Attending: Emergency Medicine | Admitting: Emergency Medicine

## 2023-02-01 ENCOUNTER — Emergency Department (HOSPITAL_BASED_OUTPATIENT_CLINIC_OR_DEPARTMENT_OTHER): Payer: Medicare Other

## 2023-02-01 ENCOUNTER — Encounter (HOSPITAL_BASED_OUTPATIENT_CLINIC_OR_DEPARTMENT_OTHER): Payer: Self-pay

## 2023-02-01 ENCOUNTER — Other Ambulatory Visit: Payer: Self-pay

## 2023-02-01 DIAGNOSIS — S0990XA Unspecified injury of head, initial encounter: Secondary | ICD-10-CM | POA: Diagnosis not present

## 2023-02-01 DIAGNOSIS — Z7901 Long term (current) use of anticoagulants: Secondary | ICD-10-CM | POA: Diagnosis not present

## 2023-02-01 DIAGNOSIS — Z91040 Latex allergy status: Secondary | ICD-10-CM | POA: Diagnosis not present

## 2023-02-01 DIAGNOSIS — W228XXA Striking against or struck by other objects, initial encounter: Secondary | ICD-10-CM | POA: Diagnosis not present

## 2023-02-01 DIAGNOSIS — G9389 Other specified disorders of brain: Secondary | ICD-10-CM | POA: Diagnosis not present

## 2023-02-01 DIAGNOSIS — S0003XA Contusion of scalp, initial encounter: Secondary | ICD-10-CM

## 2023-02-01 DIAGNOSIS — I6782 Cerebral ischemia: Secondary | ICD-10-CM | POA: Diagnosis not present

## 2023-02-01 DIAGNOSIS — R519 Headache, unspecified: Secondary | ICD-10-CM | POA: Diagnosis not present

## 2023-02-01 NOTE — ED Triage Notes (Signed)
Pt c/o HA, nausea, "strange taste in my mouth." States he hit head yesterday afternoon when standing from kneeling position. On eliquis, denies additional symptoms

## 2023-02-02 NOTE — ED Provider Notes (Signed)
[place] EMERGENCY DEPARTMENT AT [facility] Provider Note   CSN: [id number] Arrival date & time: 02/01/23  1617     History  Chief Complaint  Patient presents with   Headache   Nausea    Richard [name] is a 77 y.o. male.  Patient reports he hit the back of his head on his birdcage yesterday while he was cleaning it out.  Patient is taking Eliquis.  Patient complains of soreness in the back of his head.  Patient reports he has hit his head multiple times in the past.  Patient reports that he is accident prone.  Patient denies any loss of consciousness he has not had any nausea or vomiting.  Patient denies any visual changes he has not had any hearing changes  The history is provided by the patient. No language interpreter was used.  Headache Pain location:  Occipital Quality:  Dull Radiates to:  Does not radiate Timing:  Constant Progression:  Worsening Chronicity:  New Relieved by:  Nothing Worsened by:  Nothing      Home Medications Prior to Admission medications   Medication Sig Start Date End Date Taking? Authorizing Provider  CALCIUM-MAGNESIUM-ZINC PO Take 1 tablet by mouth 2 (two) times daily.     [provider]  cholecalciferol (VITAMIN D) 1000 UNITS tablet Take 1,000 Units by mouth daily.     [provider]  Clobetasol Propionate (TEMOVATE) 0.05 % external spray Apply topically 2 (two) times daily. 10/27/21   Janalyn Harder, MD  Cod Liver Oil CAPS Take 1 capsule by mouth every morning.    [provider]  cyclobenzaprine (FLEXERIL) 10 MG tablet TAKE 1 TABLET BY MOUTH DAILY AS  NEEDED FOR BACK MUSCLE SPASMS 07/20/22   Myrlene Broker, MD  ELIQUIS 5 MG TABS tablet TAKE 1 TABLET BY MOUTH TWICE  DAILY 12/21/22   Myrlene Broker, MD  famotidine (PEPCID) 20 MG tablet Take 1 tablet (20 mg total) by mouth daily. 07/17/22   Myrlene Broker, MD  fluticasone Aleda Grana) 50 MCG/ACT nasal spray Place 2 sprays into both  nostrils daily. 08/28/22   Myrlene Broker, MD  hydroxychloroquine (PLAQUENIL) 200 MG tablet Take 200 mg by mouth 2 (two) times daily.    [provider]  lisinopril-hydrochlorothiazide (ZESTORETIC) 10-12.5 MG tablet TAKE 1 TABLET BY MOUTH EVERY  MORNING 12/04/22   Myrlene Broker, MD  Lutein-Zeaxanthin 25-5 MG CAPS Take 1 capsule by mouth every morning.    [provider]  misoprostol (CYTOTEC) 100 MCG tablet TAKE 1 TABLET BY MOUTH TWICE  DAILY AS NEEDED 10/01/22   Myrlene Broker, MD  Multiple Vitamin (MULTIVITAMIN WITH MINERALS) TABS tablet Take 1 tablet by mouth daily.    [provider]  rosuvastatin (CRESTOR) 40 MG tablet Take 1 tablet (40 mg total) by mouth daily. 01/28/23 04/28/23  Lewayne Bunting, MD  triamcinolone cream (KENALOG) 0.1 % Apply 1 application topically daily. Apply to psoriasis rash Patient taking differently: Apply 1 application  topically daily. Apply to psoriasis rash. Pt takes as needed. 06/20/19   Vivi Barrack, DPM  vitamin B-12 (CYANOCOBALAMIN) 1000 MCG tablet Take 1,000 mcg by mouth daily.    [provider]      Allergies    Azithromycin, Latex, and Prednisone    Review of Systems   Review of Systems  Neurological:  Positive for headaches.  All other systems reviewed and are negative.   Physical Exam Updated Vital Signs BP (!) 123/91 (BP  Location: Right Arm)   Pulse 77   Temp 98.2 F (36.8 C)   Resp 18   SpO2 99%  Physical Exam Vitals and nursing note reviewed.  Constitutional:      Appearance: He is well-developed.  HENT:     Head: Normocephalic.  Cardiovascular:     Rate and Rhythm: Normal rate.  Pulmonary:     Effort: Pulmonary effort is normal.  Abdominal:     General: There is no distension.  Musculoskeletal:        General: Normal range of motion.     Cervical back: Normal range of motion.  Neurological:     Mental Status: He is alert and oriented to person, place, and time.      ED Results / Procedures / Treatments   Labs (all labs ordered are listed, but only abnormal results are displayed) Labs Reviewed - No data to display  EKG None  Radiology CT Head Wo Contrast  Result Date: 02/01/2023 CLINICAL DATA:  Headache and nausea. EXAM: CT HEAD WITHOUT CONTRAST TECHNIQUE: Contiguous axial images were obtained from the base of the skull through the vertex without intravenous contrast. RADIATION DOSE REDUCTION: This exam was performed according to the departmental dose-optimization program which includes automated exposure control, adjustment of the mA and/or kV according to patient size and/or use of iterative reconstruction technique. COMPARISON:  December 26, 2022 FINDINGS: Brain: There is mild cerebral atrophy with widening of the extra-axial spaces and ventricular dilatation. There are areas of decreased attenuation within the white matter tracts of the supratentorial brain, consistent with microvascular disease changes. Vascular: No hyperdense vessel or unexpected calcification. Skull: Normal. Negative for fracture or focal lesion. Sinuses/Orbits: There is mild left ethmoid sinus mucosal thickening. Other: None. IMPRESSION: 1. 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 2. No acute intracranial abnormality. 3. Mild left ethmoid sinus disease. Electronically Signed   By: Aram Candela M.D.   On: 02/01/2023 17:36    Procedures Procedures    Medications Ordered in ED Medications - No data to display  ED Course/ Medical Decision Making/ A&P                             Medical Decision Making Reports he hit his head yesterday on a birdcage.  Patient is on Eliquis  Amount and/or Complexity of Data Reviewed Independent Historian: spouse    Details: Patient is here with his wife who is supportive Radiology: ordered and independent interpretation performed. Decision-making details documented in ED Course.    Details: CT  head shows no acute abnormality.  Risk Risk Details: Patient is counseled on results of CT head.  Patient is advised to follow-up with his primary care physician.           Final Clinical Impression(s) / ED Diagnoses Final diagnoses:  Contusion of scalp, initial encounter    Rx / DC Orders ED Discharge Orders     None      An After Visit Summary was printed and given to the patient.    Elson Areas, PA-C 02/02/23 0017    Virgina Norfolk, DO 02/02/23 [phone]

## 2023-02-05 ENCOUNTER — Telehealth: Payer: Self-pay

## 2023-02-05 NOTE — Telephone Encounter (Signed)
Transition Care Management Follow-up Telephone Call Date of discharge and from where: 02/01/2023 [facility] How have you been since you were released from the hospital? Patient stated he is feeling much better. Any questions or concerns? No  Items Reviewed: Did the pt receive and understand the discharge instructions provided? Yes  Medications obtained and verified?  No medication prescribed. Other? No  Any new allergies since your discharge? No  Dietary orders reviewed? Yes Do you have support at home? Yes   Follow up appointments reviewed:  PCP Hospital f/u appt confirmed?  Patient has d/c instructions and make an appointment if he feels worse.  Scheduled to see  on  @ . Specialist Hospital f/u appt confirmed? No  Scheduled to see  on  @ . Are transportation arrangements needed? No  If their condition worsens, is the pt aware to call PCP or go to the Emergency Dept.? Yes Was the patient provided with contact information for the PCP's office or ED? Yes Was to pt encouraged to call back with questions or concerns? Yes   [facility]  [facility] Population Health Community Resource Care Guide   ??[email or web address]  ?? [phone]   Website: [email or web address]  [email or web address]

## 2023-02-12 ENCOUNTER — Telehealth: Payer: Self-pay | Admitting: Internal Medicine

## 2023-02-12 DIAGNOSIS — L4050 Arthropathic psoriasis, unspecified: Secondary | ICD-10-CM

## 2023-02-12 NOTE — Telephone Encounter (Signed)
Patient called to request a referral for [facility] to see Dr. Justice Britain. He said he has spoken with Dr. Okey Dupre about the issue previously. Best callback is [phone].

## 2023-02-15 ENCOUNTER — Ambulatory Visit: Payer: Medicare Other | Admitting: Podiatry

## 2023-02-15 DIAGNOSIS — M79675 Pain in left toe(s): Secondary | ICD-10-CM | POA: Diagnosis not present

## 2023-02-15 DIAGNOSIS — B351 Tinea unguium: Secondary | ICD-10-CM | POA: Diagnosis not present

## 2023-02-15 DIAGNOSIS — Z7901 Long term (current) use of anticoagulants: Secondary | ICD-10-CM | POA: Diagnosis not present

## 2023-02-15 DIAGNOSIS — M79674 Pain in right toe(s): Secondary | ICD-10-CM | POA: Diagnosis not present

## 2023-02-15 NOTE — Telephone Encounter (Signed)
I do not see reason for referral please obtain

## 2023-02-16 NOTE — Telephone Encounter (Signed)
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 Pt has stated he wanting to see Dr. Thayer Jew as she is with [facility] and he wants to see someone within [facility].

## 2023-02-16 NOTE — Telephone Encounter (Signed)
Referral placed.

## 2023-02-16 NOTE — Addendum Note (Signed)
Addended by: Hillard Danker A on: 02/16/2023 04:25 PM   Modules accepted: Orders

## 2023-02-17 ENCOUNTER — Other Ambulatory Visit: Payer: Self-pay | Admitting: Internal Medicine

## 2023-02-18 NOTE — Progress Notes (Signed)
Subjective: Chief Complaint  Patient presents with   RFC    RFC BLOOD THINNER    77 y.o. returns the office today for painful, elongated, thickened toenails which he cannot trim himself. Denies any redness or drainage around the nails.  No open lesions.  No other concerns today.    PCP: Georgianne Fick, MD  He is on Eliquis for A-fib  Objective: AAO 3, NAD DP/PT pulses palpable, CRT less than 3 seconds Nails hypertrophic, dystrophic, elongated, brittle, discolored 10. There is tenderness overlying the nails 1-5 bilaterally. There is no surrounding erythema or drainage along the nail sites.  No open lesions or pre-ulcerative lesions are identified. Hammertoes present, rigid No pain with calf compression, swelling, warmth, erythema.  Assessment: Patient presents with symptomatic onychomycosis, on Eliquis  Plan: -Treatment options including alternatives, risks, complications were discussed -Nails sharply debrided 10 without complication/bleeding. -Discussed daily foot inspection. If there are any changes, to call the office immediately.  -Follow-up as scheduled or sooner if any problems are to arise. In the meantime, encouraged to call the office with any questions, concerns, changes symptoms.  Return in about 3 months (around 05/18/2023).  Vivi Barrack DPM

## 2023-02-27 IMAGING — CT CT HEAD W/O CM
4 series · 16 of 47 positions shown, 18 images · non-contrast
Comparison: Head CT dated 07/25/2020.

CLINICAL DATA: Head trauma.



[Series 2: head wo · axial · 0.42mm/px · z∈[-234,-114]mm · 7 of 32 slices shown, 9 images]
[im 4/32  brain]
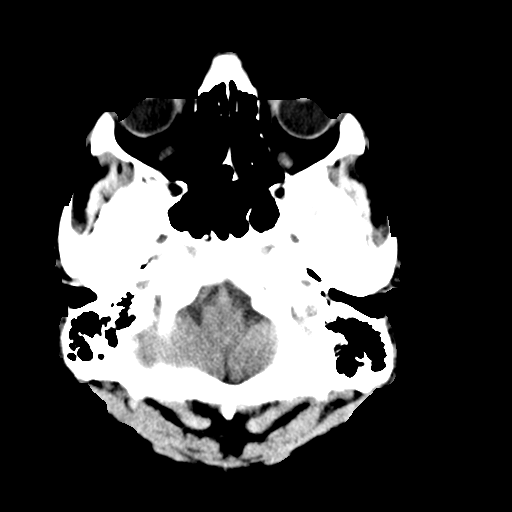
[im 4/32  bone]
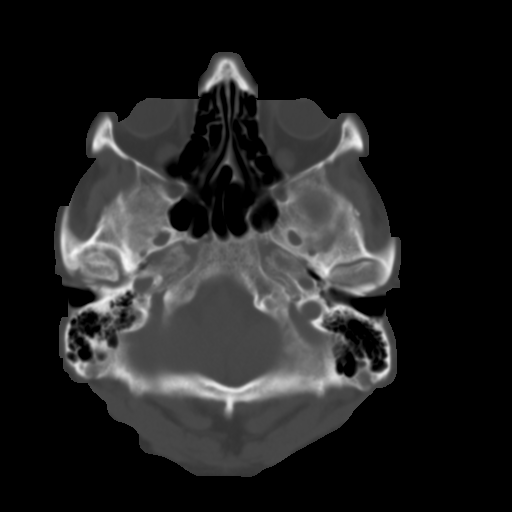
[im 8/32  brain]
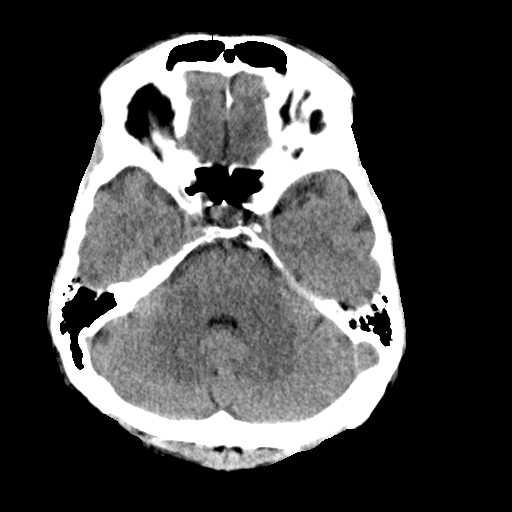
[im 12/32  brain]
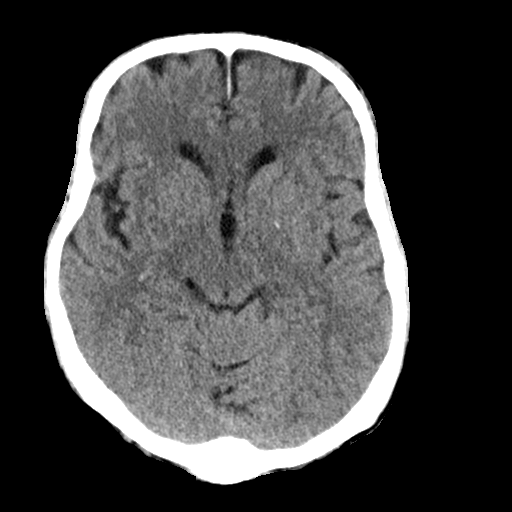
[im 16/32  brain]
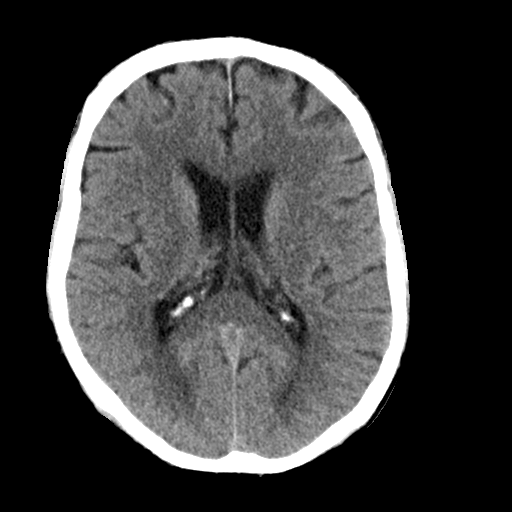
[im 20/32  brain]
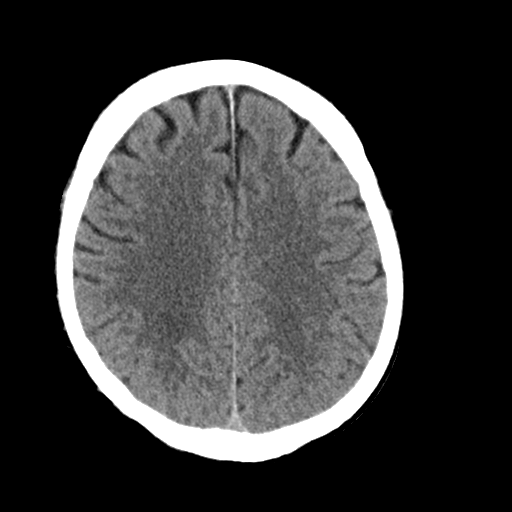
[im 20/32  bone]
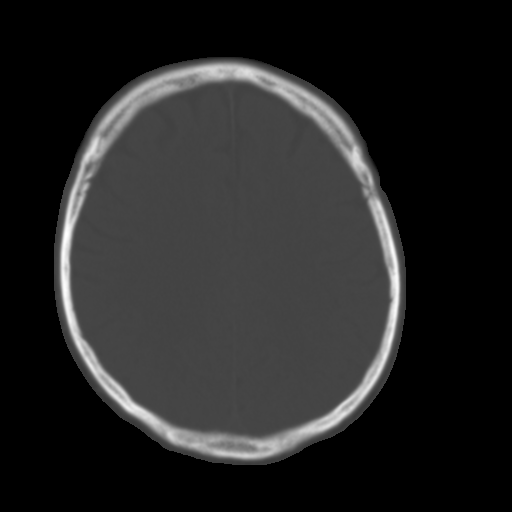
[im 24/32  brain]
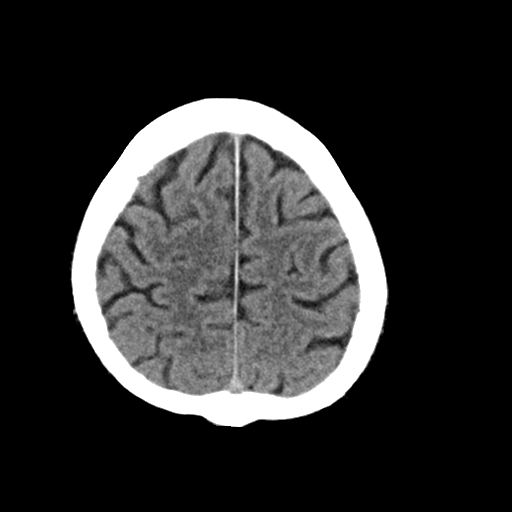
[im 28/32  brain]
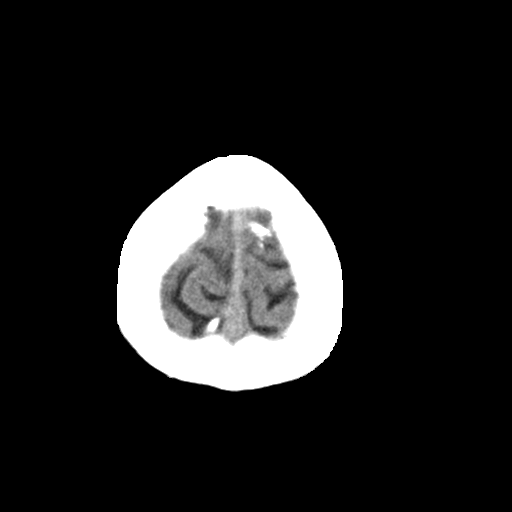

[Series 3: head bone · axial · 0.42mm/px · z∈[-235,-203]mm · 3 of 78 slices shown]
[im 8/78  bone]
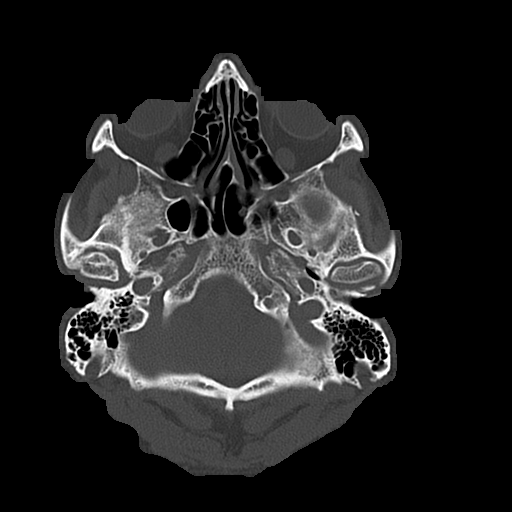
[im 16/78  bone]
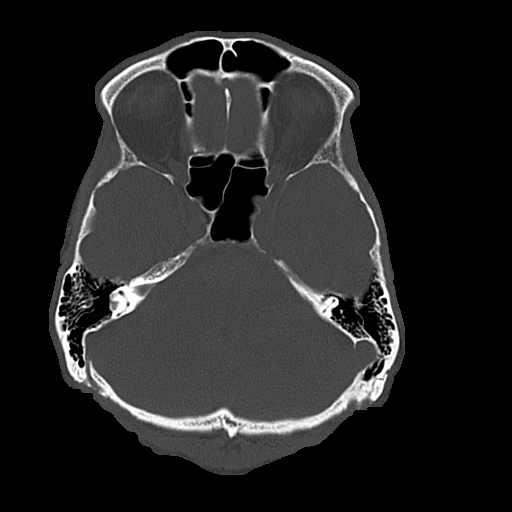
[im 24/78  bone]
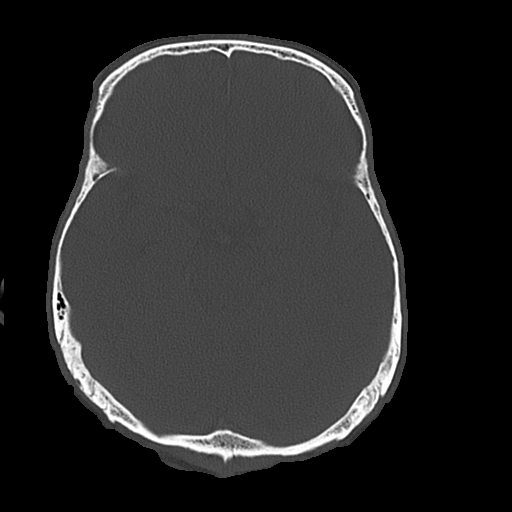

[Series 4: coronal soft · coronal · 0.32mm/px · 3 of 69 slices shown]
[im 23/69  brain]
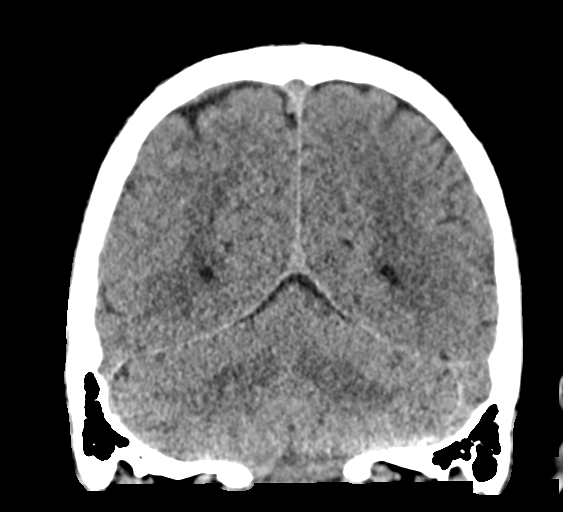
[im 31/69  brain]
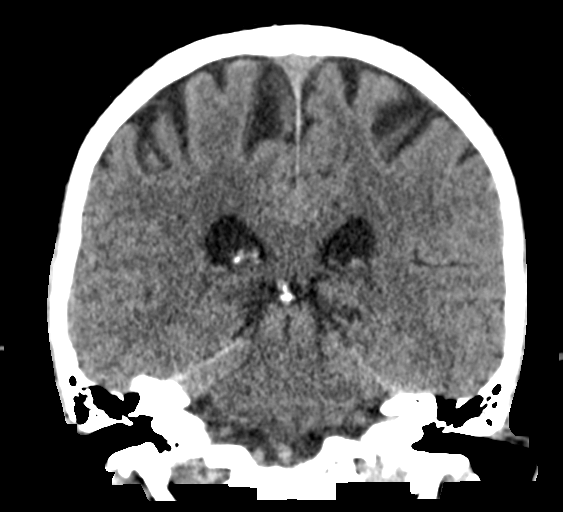
[im 38/69  brain]
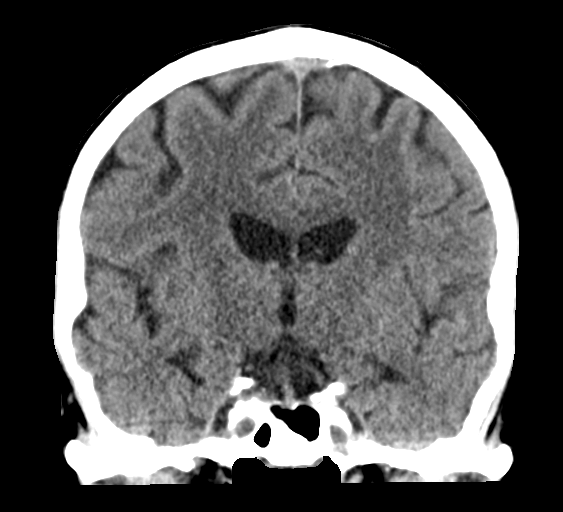

[Series 5: sagittal soft · sagittal · 0.32mm/px · 3 of 60 slices shown]
[im 20/60  brain]
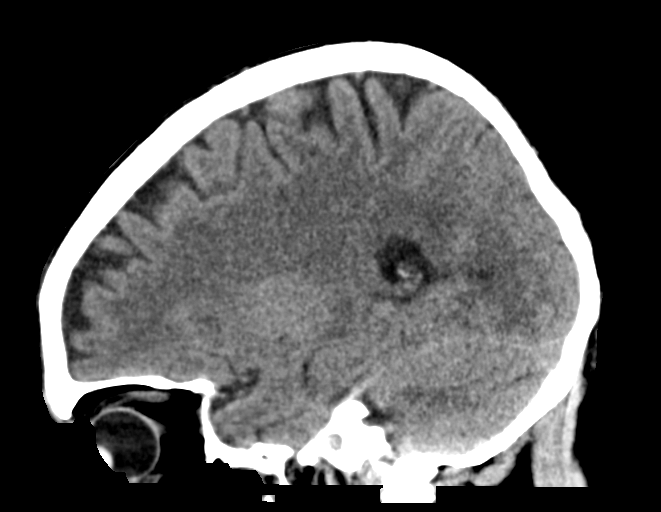
[im 30/60  brain]
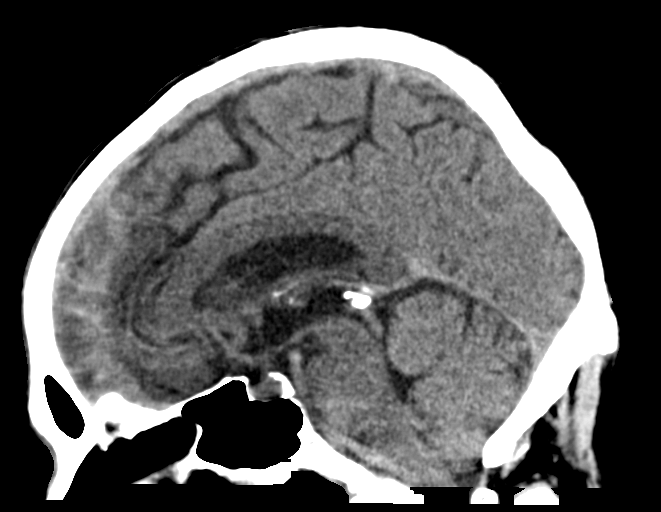
[im 40/60  brain]
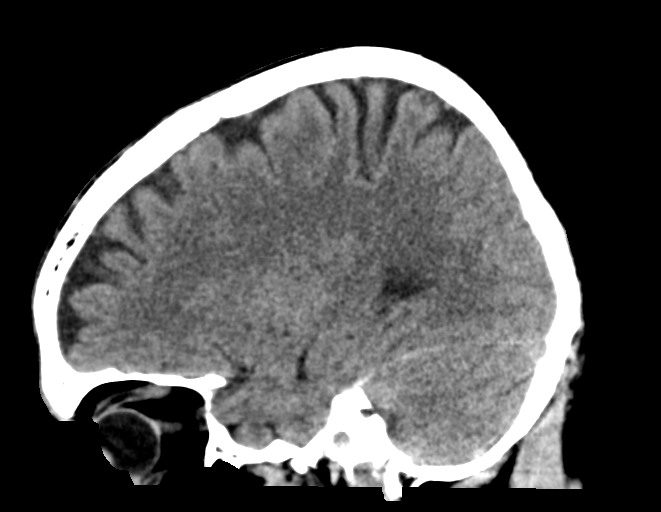

[16 of 47 positions shown; findings below may reference images not displayed]

FINDINGS: Brain: The ventricles and sulci are appropriate size for the
patient's age. The gray-white matter discrimination is preserved.
There is no acute intracranial hemorrhage. No mass effect or midline
shift. No extra-axial fluid collection.

Vascular: No hyperdense vessel or unexpected calcification.

Skull: Normal. Negative for fracture or focal lesion.

Sinuses/Orbits: No acute finding.

Other: None
IMPRESSION: No acute intracranial pathology.

## 2023-03-01 ENCOUNTER — Telehealth: Payer: Self-pay | Admitting: Internal Medicine

## 2023-03-01 NOTE — Telephone Encounter (Signed)
Prescription Request  03/01/2023  LOV: 10/16/2022  What is the name of the medication or equipment?  cyclobenzaprine (FLEXERIL) 10 MG tablet [   fluticasone (FLONASE) 50 MCG/ACT nasal spray [   Have you contacted your pharmacy to request a refill? No   Which pharmacy would you like this sent to?    [facility] - [place], [place] - [address] [place]  [address] Phone: [phone] Fax: [phone]  Patient notified that their request is being sent to the clinical staff for review and that they should receive a response within 2 business days.   Please advise at Mobile [phone] (mobile)

## 2023-03-02 ENCOUNTER — Other Ambulatory Visit: Payer: Self-pay

## 2023-03-02 MED ORDER — FLUTICASONE PROPIONATE 50 MCG/ACT NA SUSP: 2.0000 | Freq: Every day | NASAL | 3 refills | Status: AC

## 2023-03-02 MED ORDER — CYCLOBENZAPRINE HCL 10 MG PO TABS: 10.0000 mg | ORAL_TABLET | Freq: Every day | ORAL | 1 refills | Status: DC

## 2023-03-02 MED ORDER — FLUTICASONE PROPIONATE 50 MCG/ACT NA SUSP: 2.0000 | Freq: Every day | NASAL | 3 refills | Status: DC

## 2023-03-02 NOTE — Telephone Encounter (Signed)
Sent in

## 2023-03-19 DIAGNOSIS — H2513 Age-related nuclear cataract, bilateral: Secondary | ICD-10-CM | POA: Diagnosis not present

## 2023-03-19 DIAGNOSIS — H35373 Puckering of macula, bilateral: Secondary | ICD-10-CM | POA: Diagnosis not present

## 2023-03-19 DIAGNOSIS — H2511 Age-related nuclear cataract, right eye: Secondary | ICD-10-CM | POA: Diagnosis not present

## 2023-03-19 DIAGNOSIS — H25043 Posterior subcapsular polar age-related cataract, bilateral: Secondary | ICD-10-CM | POA: Diagnosis not present

## 2023-03-19 DIAGNOSIS — H25013 Cortical age-related cataract, bilateral: Secondary | ICD-10-CM | POA: Diagnosis not present

## 2023-03-31 ENCOUNTER — Other Ambulatory Visit: Payer: Self-pay | Admitting: Internal Medicine

## 2023-04-02 DIAGNOSIS — E7800 Pure hypercholesterolemia, unspecified: Secondary | ICD-10-CM | POA: Diagnosis not present

## 2023-04-02 LAB — LIPID PANEL
Chol/HDL Ratio: 1.7 {ratio} (ref 0.0–5.0)
Cholesterol, Total: 132 mg/dL (ref 100–199)
HDL: 78 mg/dL (ref >39)
LDL Chol Calc (NIH): 43 mg/dL (ref 0–99)
Triglycerides: 50 mg/dL (ref 0–149)
VLDL Cholesterol Cal: 11 mg/dL (ref 5–40)

## 2023-04-02 LAB — HEPATIC FUNCTION PANEL
ALT: 34 [IU]/L (ref 0–44)
AST: 35 [IU]/L (ref 0–40)
Albumin: 4.2 g/dL (ref 3.8–4.8)
Alkaline Phosphatase: 104 [IU]/L (ref 44–121)
Bilirubin Total: 0.9 mg/dL (ref 0.0–1.2)
Bilirubin, Direct: 0.29 mg/dL (ref 0.00–0.40)
Total Protein: 6.8 g/dL (ref 6.0–8.5)

## 2023-04-09 ENCOUNTER — Other Ambulatory Visit: Payer: Self-pay | Admitting: Internal Medicine

## 2023-04-09 DIAGNOSIS — Z1212 Encounter for screening for malignant neoplasm of rectum: Secondary | ICD-10-CM

## 2023-04-09 DIAGNOSIS — Z1211 Encounter for screening for malignant neoplasm of colon: Secondary | ICD-10-CM

## 2023-04-09 NOTE — Progress Notes (Signed)
HPI: FU atrial fibrillation.  Holter monitor February 2021 showed sinus rhythm with PACs, couplets, bigeminy, atrial run and bradycardia during early a.m. hours. Since having Covid March 2020 he has had some fatigue with activities as well as dyspnea with more vigorous activities relieved with rest.  Abdominal ultrasound August 2021 showed no aneurysm. Patient previously found to be in recurrent atrial fibrillation and there was discussion about rate control versus rhythm control.  He preferred rate control. Echocardiogram repeated May 2024 and showed ejection fraction 60 to 65%, mild left ventricular hypertrophy, moderate left atrial enlargement, severe right atrial enlargement, mild mitral regurgitation, mildly dilated ascending aorta.  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  Patient has been seen in the emergency room multiple times with complaints of headache after hitting his head on his birdcage or other trauma.  CT showed no bleed.  Since last seen the patient denies any dyspnea on exertion, orthopnea, PND, pedal edema, palpitations, syncope or chest pain.   Current Outpatient Medications  Medication Sig Dispense Refill   CALCIUM-MAGNESIUM-ZINC PO Take 1 tablet by mouth 2 (two) times daily.      cholecalciferol (VITAMIN D) 1000 UNITS tablet Take 1,000 Units by mouth daily.      Cod Liver Oil CAPS Take 1 capsule by mouth every morning.     cyclobenzaprine (FLEXERIL) 10 MG tablet Take 1 tablet (10 mg total) by mouth at bedtime. 90 tablet 1   ELIQUIS 5 MG TABS tablet TAKE 1 TABLET BY MOUTH TWICE  DAILY 200 tablet 2   famotidine (PEPCID) 20 MG tablet TAKE 1 TABLET BY MOUTH DAILY 100 tablet 2   fluticasone (FLONASE) 50 MCG/ACT nasal spray Place 2 sprays into both nostrils daily. 48 g 3   hydroxychloroquine (PLAQUENIL) 200 MG tablet Take 200 mg by mouth 2 (two) times daily.     lisinopril-hydrochlorothiazide (ZESTORETIC)  10-12.5 MG tablet TAKE 1 TABLET BY MOUTH EVERY  MORNING 100 tablet 2   Lutein-Zeaxanthin 25-5 MG CAPS Take 1 capsule by mouth every morning.     misoprostol (CYTOTEC) 100 MCG tablet TAKE 1 TABLET BY MOUTH TWICE  DAILY AS NEEDED 240 tablet 2   Multiple Vitamin (MULTIVITAMIN WITH MINERALS) TABS tablet Take 1 tablet by mouth daily.     rosuvastatin (CRESTOR) 40 MG tablet Take 1 tablet (40 mg total) by mouth daily. 90 tablet 3   triamcinolone cream (KENALOG) 0.1 % Apply 1 application topically daily. Apply to psoriasis rash (Patient taking differently: Apply 1 application  topically daily. Apply to psoriasis rash. Pt takes as needed.) 30 g 1   vitamin B-12 (CYANOCOBALAMIN) 1000 MCG tablet Take 1,000 mcg by mouth daily.     Clobetasol Propionate (TEMOVATE) 0.05 % external spray Apply topically 2 (two) times daily. (Patient not taking: Reported on 04/23/2023) 59 mL 6   No current facility-administered medications for this visit.     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    Atrial fibrillation (HCC)    Back pain    Dyspnea    history of covid 09/2018 and with extertion    Dysrhythmia    GERD (gastroesophageal reflux disease)    Headache    Hypertension    Pain    PAIN AND OA RT KNEE   Pneumonia    YEARS AGO   Psoriasis    arthritis  Snoring 04/27/2014    Past Surgical History:  Procedure Laterality Date   INGUINAL HERNIA REPAIR Left 01/29/2021   Procedure: LEFT INGUINAL HERNIA REPAIR WITH MESH;  Surgeon: Emelia Loron, MD;  Location: [facility];  Service: General;  Laterality: Left;   KNEE ARTHROSCOPY Right 01/31/2014   Procedure: RIGHT ARTHROSCOPY KNEE WITH DEBRIDMENT CHONDROPLASTY;  Surgeon: Loanne Drilling, MD;  Location: [facility];  Service: Orthopedics;  Laterality: Right;   LUMP REMOVED FROM LEFT BREAST Left 07/06/2006   STATES SURGERY  AT [facility] - LOCAL- NOT CANCER 5 OR MORE YRS AGO   MORTON'S NEUROMA  REMOVED     TONSILLECTOMY     AS A CHILD   TRANSTHORACIC ECHOCARDIOGRAM  07/2020   Normal LV size and function.  GR 1 DD.  Mild RV enlargement.  Moderate LA & RA enlargement.  Mild MR.  MR likely related to mildly for MR/MVP.  Moderate TR, mildly elevated RAP.Marland Kitchen  Moderate ao valve thickening.  Mild AOV sclerosis but no stenosis.    Social History   Socioeconomic History   Marital status: Married    Spouse name: Not on file   Number of children: 0   Years of education: Not on file   Highest education level: Bachelor's degree (e.g., BA, AB, BS)  Occupational History   Not on file  Tobacco Use   Smoking status: Former    Current packs/day: 0.00    Types: Cigarettes    Quit date: 1990    Years since quitting: 34.8   Smokeless tobacco: Never  Vaping Use   Vaping status: Never Used  Substance and Sexual Activity   Alcohol use: Yes    Comment: QUIT SMOKING ABOUT 15 YRS AGO--WAS ONLY 2 OR 3 CIGARETTES WITH A BEER ON WEEK ENDS   Drug use: No   Sexual activity: Yes  Other Topics Concern   Not on file  Social History Narrative   Right handed, caffeine 2-3 cups daily, married, no kids, has baby bird.  WC back injury, so not working at this time.  BS in econmics.   Social Determinants of Health   Financial Resource Strain: Low Risk  (10/14/2022)   Overall Financial Resource Strain (CARDIA)    Difficulty of Paying Living Expenses: Not hard at all  Food Insecurity: No Food Insecurity (10/14/2022)   Hunger Vital Sign    Worried About Running Out of Food in the Last Year: Never true    Ran Out of Food in the Last Year: Never true  Transportation Needs: No Transportation Needs (10/14/2022)   PRAPARE - Administrator, Civil Service (Medical): No    Lack of Transportation (Non-Medical): No  Physical Activity: Not on file  Stress: Not on file  Social Connections: Unknown (10/14/2022)   Social Connection and Isolation Panel [NHANES]    Frequency of Communication with Friends and  Family: More than three times a week    Frequency of Social Gatherings with Friends and Family: Once a week    Attends Religious Services: Patient declined    Database administrator or Organizations: No    Attends Engineer, structural: Not on file    Marital Status: Married  Catering manager Violence: Not on file    Family History  Problem Relation Age of Onset   Diabetes Mother    Hypertension Mother    Heart disease Mother        has a stent    Stroke Father 16  Hypertension Father     ROS: no fevers or chills, productive cough, hemoptysis, dysphasia, odynophagia, melena, hematochezia, dysuria, hematuria, rash, seizure activity, orthopnea, PND, pedal edema, claudication. Remaining systems are negative.  Physical Exam: Well-developed well-nourished in no acute distress.  Skin is warm and dry.  HEENT is normal.  Neck is supple.  Chest is clear to auscultation with normal expansion.  Cardiovascular exam is irregular Abdominal exam nontender or distended. No masses palpated. Extremities show no edema. neuro grossly intact  EKG Interpretation Date/Time:  Friday April 23 2023 09:43:48 EDT Ventricular Rate:  76 PR Interval:    QRS Duration:  102 QT Interval:  388 QTC Calculation: 436 R Axis:   107  Text Interpretation: Atrial fibrillation Rightward axis Incomplete right bundle branch block Possible Anterior infarct (cited on or before 23-Jul-2022) When compared with ECG of 23-Jul-2022 09:52, No significant change was found Confirmed by Olga Millers ([id number]) on 04/23/2023 9:51:35 AM    A/P  1 atrial fibrillation-plan to continue apixaban.  He has been seen previously after hitting his head on a birdcage in the emergency room.  I raised the issue of Watchman device today but he would prefer to continue with apixaban.  Will consider this in the future if he continues to have this issue.   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  He will need follow-up studies in the future.   3 hypertension-blood pressure controlled.  Continue present medical regimen.   4 hyperlipidemia-continue statin.   5 coronary calcification-patient denies chest pain.  Continue statin.  Olga Millers, MD

## 2023-04-12 DIAGNOSIS — H43813 Vitreous degeneration, bilateral: Secondary | ICD-10-CM | POA: Diagnosis not present

## 2023-04-12 DIAGNOSIS — H43393 Other vitreous opacities, bilateral: Secondary | ICD-10-CM | POA: Diagnosis not present

## 2023-04-12 DIAGNOSIS — H2513 Age-related nuclear cataract, bilateral: Secondary | ICD-10-CM | POA: Diagnosis not present

## 2023-04-12 DIAGNOSIS — H35373 Puckering of macula, bilateral: Secondary | ICD-10-CM | POA: Diagnosis not present

## 2023-04-23 ENCOUNTER — Encounter: Payer: Self-pay | Admitting: Cardiology

## 2023-04-23 ENCOUNTER — Ambulatory Visit: Payer: Medicare Other | Attending: Cardiology | Admitting: Cardiology

## 2023-04-23 VITALS — BP 108/66 | HR 72 | Ht 69.0 in | Wt 160.6 lb

## 2023-04-23 DIAGNOSIS — E7800 Pure hypercholesterolemia, unspecified: Secondary | ICD-10-CM

## 2023-04-23 DIAGNOSIS — I10 Essential (primary) hypertension: Secondary | ICD-10-CM | POA: Diagnosis not present

## 2023-04-23 DIAGNOSIS — I2510 Atherosclerotic heart disease of native coronary artery without angina pectoris: Secondary | ICD-10-CM | POA: Diagnosis not present

## 2023-04-23 DIAGNOSIS — I4821 Permanent atrial fibrillation: Secondary | ICD-10-CM

## 2023-04-23 NOTE — Patient Instructions (Signed)

## 2023-04-27 ENCOUNTER — Telehealth: Payer: Self-pay | Admitting: *Deleted

## 2023-04-27 NOTE — Telephone Encounter (Signed)
Patient with diagnosis of afib on Eliquis for anticoagulation.    Procedure: vitrectomy surgery Date of procedure: 05/12/23  CHA2DS2-VASc Score = 4  This indicates a 4.8% annual risk of stroke. The patient's score is based upon: CHF History: 0 HTN History: 1 Diabetes History: 0 Stroke History: 0 Vascular Disease History: 1 Age Score: 2 Gender Score: 0   CrCl 64mL/min Platelet count 185K  3 days seems to be a bit long of a hold request, should likely be ok with 1-2 day Eliquis hold. His CV risk is relatively low though, if his ophthalmologist really prefers 3 day hold, would be ok.   **This guidance is not considered finalized until pre-operative APP has relayed final recommendations.**

## 2023-04-27 NOTE — Telephone Encounter (Signed)
Pre-operative Risk Assessment    Patient Name: Richard [name]  DOB: 1945-08-05 MRN: [id number]      Request for Surgical Clearance    Procedure:   VITRECTOMY SURGERY  Date of Surgery:  Clearance 05/12/23                                 Surgeon:  DR. Arlys John, MD Surgeon's Group or Practice Name:  [facility] Phone number:  [phone] Fax number:  [phone]   Type of Clearance Requested:   - Medical  - Pharmacy:  Hold Apixaban (Eliquis) X'S 3 DAYS   Type of Anesthesia:  MAC   Additional requests/questions:    Wilhemina Cash   04/27/2023, 10:31 AM

## 2023-04-27 NOTE — Telephone Encounter (Signed)
     Primary Cardiologist: Olga Millers, MD  Chart reviewed as part of pre-operative protocol coverage. Given past medical history and time since last visit, based on ACC/AHA guidelines, Richard [name] would be at acceptable risk for the planned procedure without further cardiovascular testing.   Patient with diagnosis of afib on Eliquis for anticoagulation.     Procedure: vitrectomy surgery Date of procedure: 05/12/23   CHA2DS2-VASc Score = 4  This indicates a 4.8% annual risk of stroke. The patient's score is based upon: CHF History: 0 HTN History: 1 Diabetes History: 0 Stroke History: 0 Vascular Disease History: 1 Age Score: 2 Gender Score: 0   CrCl 71mL/min Platelet count 185K   3 days seems to be a bit long of a hold request, should likely be ok with 1-2 day Eliquis hold. His CV risk is relatively low though, if his ophthalmologist really prefers 3 day hold, would be ok.  I will route this recommendation to the requesting party via Epic fax function and remove from pre-op pool.  Please call with questions.  Thomasene Ripple. Pate Aylward NP-C     04/27/2023, 11:46 AM [facility] [address] Office [phone] Fax [phone]

## 2023-04-28 DIAGNOSIS — I8312 Varicose veins of left lower extremity with inflammation: Secondary | ICD-10-CM | POA: Diagnosis not present

## 2023-04-28 DIAGNOSIS — M79661 Pain in right lower leg: Secondary | ICD-10-CM | POA: Diagnosis not present

## 2023-04-28 DIAGNOSIS — I8311 Varicose veins of right lower extremity with inflammation: Secondary | ICD-10-CM | POA: Diagnosis not present

## 2023-04-28 DIAGNOSIS — R600 Localized edema: Secondary | ICD-10-CM | POA: Diagnosis not present

## 2023-04-28 DIAGNOSIS — I83891 Varicose veins of right lower extremities with other complications: Secondary | ICD-10-CM | POA: Diagnosis not present

## 2023-04-29 ENCOUNTER — Encounter: Payer: Self-pay | Admitting: Family Medicine

## 2023-05-05 DIAGNOSIS — I83891 Varicose veins of right lower extremities with other complications: Secondary | ICD-10-CM | POA: Diagnosis not present

## 2023-05-12 DIAGNOSIS — H35371 Puckering of macula, right eye: Secondary | ICD-10-CM | POA: Diagnosis not present

## 2023-05-12 DIAGNOSIS — H2511 Age-related nuclear cataract, right eye: Secondary | ICD-10-CM | POA: Diagnosis not present

## 2023-05-18 NOTE — Progress Notes (Signed)
Office Visit Note  Patient: Richard [name]             Date of Birth: Dec 16, 1945           MRN: [id number]             PCP: Myrlene Broker, MD Referring: Myrlene Broker, * Visit Date: 06/01/2023 Occupation: @GUAROCC @  Subjective:  Stiffness in both hands and other joints   History of Present Illness: Richard [name] is a 77 y.o. male seen in consult per request of his PCP.  According to the patient his symptoms started in his 65s with knee joint pain.  At the time he was living in [place].  He was seen by rheumatologist who diagnosed him with rheumatoid arthritis and started him on sulindac and Plaquenil combination.  Patient states he was on sulindac until approximately 5 years ago when Eliquis was started.  He continued Plaquenil over the years.  He states he used to get cortisone injection to his bilateral knee joints once a year for swelling.  He moved to [place] in 2006 and established with a rheumatologist here.  He has been under care of Dr. Nickola Major for a while last several years.  He continues to have pain and discomfort in his bilateral shoulders, bilateral knee joints in his hands.  He denies pain or swelling in any other joints.  There is no history of plantar fasciitis, Achilles tendinitis or uveitis.  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  He states surgery was not advised.  He states he gets frequent infections.  He has been given different treatment options by previous rheumatologist but he was not interested due to increased risk of infection.  He is satisfied with Plaquenil therapy.  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 Jimmie Molly at [facility].  He is getting venous stripping.  He also has a ulcer on his right lower extremity for the last 6 months which is gradually healing per patient.  Patient states in  2006 he was seen by Dr. Jorja Loa who diagnosed him with psoriasis.  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  He is trying to establish with a new dermatologist.  He continues to have some psoriasis on his scalp, back and his knees.  He is currently not using any topical agents.  There is no family history of psoriasis.  He is retired Music therapist.  He enjoys walking and lifting weights.  He was accompanied by his wife Carney Bern today.    Activities of Daily Living:  Patient reports morning stiffness for 2-3 hours.   Patient Denies nocturnal pain.  Difficulty dressing/grooming: Denies Difficulty climbing stairs: Denies Difficulty getting out of chair: Denies Difficulty using hands for taps, buttons, cutlery, and/or writing: Reports  Review of Systems  Constitutional:  Negative for fatigue.  HENT:  Negative for mouth sores and mouth dryness.   Eyes:  Positive for dryness.  Respiratory:  Negative for shortness of breath.   Cardiovascular:  Negative for chest pain and palpitations.  Gastrointestinal:  Negative for blood in stool, constipation and diarrhea.  Endocrine: Positive for increased urination.  Genitourinary:  Negative for involuntary urination.  Musculoskeletal:  Positive for joint pain, joint pain, joint swelling and morning stiffness. Negative for gait problem, myalgias, muscle weakness, muscle tenderness and myalgias.  Skin:  Positive for rash. Negative for color change, hair loss and sensitivity to sunlight.  Allergic/Immunologic: Negative for susceptible to infections.  Neurological:  Negative for dizziness and headaches.  Hematological:  Negative for swollen glands.  Psychiatric/Behavioral:  Positive for sleep disturbance. Negative for depressed mood. The patient is not nervous/anxious.     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   Impaired fasting blood sugar 03/06/2022   Paroxysmal A-fib (HCC) 02/27/2022   Impingement syndrome of shoulder region 02/23/2022   Osteoarthritis of left glenohumeral joint 02/23/2022   Bilateral hearing loss 08/14/2020   Cough, persistent 08/14/2020   Deviated septum 08/14/2020   First degree AV block 08/24/2019   NSVT (nonsustained ventricular tachycardia) (HCC) 07/25/2019   Mixed hyperlipidemia 07/25/2019   Essential hypertension 07/03/2018   Impingement syndrome of right shoulder region 11/10/2017   Gastroesophageal reflux disease without esophagitis 03/10/2017   Rhinitis, chronic 03/10/2017   Chronic low back pain without sciatica 12/17/2015   Bilateral knee pain 12/17/2015   Psoriatic arthritis (HCC) 12/17/2015   Snoring 04/27/2014    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    Atrial fibrillation (HCC)    Back pain    Dyspnea    history of covid 09/2018 and with extertion    Dysrhythmia    GERD (gastroesophageal reflux disease)    Headache    Hypertension    Pain    PAIN AND OA RT KNEE   Pneumonia    YEARS AGO   Psoriasis    arthritis   Snoring 04/27/2014    Family History  Problem Relation Age of Onset   Diabetes Mother    Hypertension Mother    Heart disease Mother        has a stent    Stroke Father 41   Hypertension Father    Stroke Brother    Parkinson's disease Brother    Past Surgical History:  Procedure Laterality Date   INGUINAL HERNIA REPAIR Left 01/29/2021   Procedure: LEFT INGUINAL HERNIA REPAIR WITH MESH;  Surgeon: Emelia Loron, MD;  Location: [facility];  Service: General;  Laterality: Left;   KNEE ARTHROSCOPY Right 01/31/2014   Procedure: RIGHT ARTHROSCOPY KNEE WITH DEBRIDMENT CHONDROPLASTY;  Surgeon: Loanne Drilling, MD;  Location: [facility];   Service: Orthopedics;  Laterality: Right;   LUMP REMOVED FROM LEFT BREAST Left 07/06/2006   STATES SURGERY  AT [facility] - LOCAL- NOT CANCER 5 OR MORE YRS AGO   MORTON'S NEUROMA REMOVED     TONSILLECTOMY     AS A CHILD   TRANSTHORACIC ECHOCARDIOGRAM  07/2020   Normal LV size and function.  GR 1 DD.  Mild RV enlargement.  Moderate LA & RA enlargement.  Mild MR.  MR likely related to mildly for MR/MVP.  Moderate TR, mildly elevated RAP.Marland Kitchen  Moderate ao valve thickening.  Mild AOV sclerosis but no stenosis.   Social History   Social History Narrative   Right handed, caffeine 2-3 cups daily, married, no kids, has baby bird.  WC back injury, so not working at this time.  BS in econmics.   Immunization History  Administered Date(s) Administered   PNEUMOCOCCAL CONJUGATE-20 03/06/2022  Pneumococcal Conjugate-13 07/30/2006   Pneumococcal Polysaccharide-23 07/30/2006, 06/14/2018   Tdap 07/28/2018   Zoster Recombinant(Shingrix) 08/16/2020, 12/06/2020   Zoster, Live 05/14/2012     Objective: Vital Signs: BP 113/74 (BP Location: Right Arm, Patient Position: Sitting, Cuff Size: Small)   Pulse 80   Resp 12   Ht 5' 7.5" (1.715 m)   Wt 165 lb 9.6 oz (75.1 kg)   BMI 25.55 kg/m    Physical Exam Vitals and nursing note reviewed.  Constitutional:      Appearance: He is well-developed.  HENT:     Head: Normocephalic and atraumatic.  Eyes:     Conjunctiva/sclera: Conjunctivae normal.     Pupils: Pupils are equal, round, and reactive to light.  Cardiovascular:     Rate and Rhythm: Normal rate and regular rhythm.     Heart sounds: Normal heart sounds.  Pulmonary:     Effort: Pulmonary effort is normal.     Breath sounds: Normal breath sounds.  Abdominal:     General: Bowel sounds are normal.     Palpations: Abdomen is soft.  Musculoskeletal:     Cervical back: Normal range of motion and neck supple.  Skin:    General: Skin is warm and dry.     Capillary Refill: Capillary refill takes less  than 2 seconds.     Comments: Psoriasis patches were noted on bilateral knee joints and his back.  Neurological:     Mental Status: He is alert and oriented to person, place, and time.  Psychiatric:        Behavior: Behavior normal.      Musculoskeletal Exam: Patient had limited lateral rotation, flexion and extension of the cervical spine.  He had limited mobility in thoracic and lumbar spine.  He had no tenderness over SI joints.  Shoulder joint abduction was about 90 degrees and forward flexion about 100 degrees.  Internal rotation was limited.  Right elbow joint had 10 degree contracture and left elbow joint 5 degree contracture.  He had no extension and very limited flexion of bilateral wrist joints with synovial thickening without any active synovitis.  He had bilateral MCP thickening without any synovitis.  He had contractures of most of his PIP and DIP joints without active synovitis.  He had incomplete fist formation and incomplete extension of PIPs and DIPs.  Hip joints were in good range of motion without discomfort.  He had limited extension of bilateral knee joints without any synovitis.  Some warmth was noted in the knee joints.  He had thickening of bilateral ankle joints without any synovitis.  No PIP and DIP thickening of the feet was noted.  CDAI Exam: CDAI Score: -- Patient Global: --; Provider Global: -- Swollen: --; Tender: -- Joint Exam 06/01/2023   No joint exam has been documented for this visit   There is currently no information documented on the homunculus. Go to the Rheumatology activity and complete the homunculus joint exam.  Investigation: No additional findings.  Imaging: No results found.  Recent Labs: Lab Results  Component Value Date   WBC 5.2 08/24/2022   HGB 13.7 08/24/2022   PLT 185.0 08/24/2022   NA 141 08/24/2022   K 4.1 08/24/2022   CL 103 08/24/2022   CO2 29 08/24/2022   GLUCOSE 85 08/24/2022   BUN 17 08/24/2022   CREATININE 0.90  08/24/2022   BILITOT 0.9 04/02/2023   ALKPHOS 104 04/02/2023   AST 35 04/02/2023   ALT 34 04/02/2023   PROT  6.8 04/02/2023   ALBUMIN 4.2 04/02/2023   CALCIUM 10.0 08/24/2022   GFRAA 98 01/29/2020    Speciality Comments: Eye exam- 04/24 Dr. Karie Soda per pt.  Procedures:  No procedures performed Allergies: Azithromycin, Latex, and Prednisone   Assessment / Plan:     Visit Diagnoses: Psoriatic arthritis (HCC) -patient states that his symptoms started in his 40s.  He was initially diagnosed with rheumatoid arthritis in [place] and was started on sulindac and hydroxychloroquine.  He continued hydroxychloroquine since then.  Patient states he did not want any aggressive therapy because of frequent infections.  He moved to [facility] in 2006 and was diagnosed with psoriasis and psoriatic arthritis by Dr. Jorja Loa.  He was under care of of another rheumatologist and then had been under care of Dr. Nickola Major for last several years.  Patient states he gets knee joint injections frequently for knee joint discomfort.  He has not noticed any joint swelling.  He has difficulty doing activities due to limitations and stiffness in his joints.  No synovitis was noted on the examination today.  He had limited range of motion of bilateral shoulder joints which he relates to rotator cuff injury to his both shoulders well at work in 2011.  He had limited range of motion of the cervical spine.  He had limited extension of his PIP and DIP joints with incomplete fist formation.  He had warmth on palpation of his knee joints without any effusion or synovitis.  There is no history of Achilles tendinitis, plantar fasciitis or uveitis.  He denies any shortness of breath.  Indications and side effects of hydroxychloroquine were discussed.  A handout was given and consent was taken.  Will check CBC and CMP today.  Plan: Sedimentation rate  High risk medication use - plaquenil 200 mg 1 tablet by mouth twice daily since he was 77  years old.  According the patient he is followed by Dr. Vonna Kotyk and Dr. Karie Soda.  According the patient his last eye examination for Plaquenil toxicity monitoring was in April 2024.  Plan: CBC with Differential/Platelet, COMPLETE METABOLIC PANEL WITH GFR, Glucose 6 phosphate dehydrogenase.  Information regarding hydroxychloroquine was provided.  Information on immunization was placed in the AVS.  He was advised to get annual ocular examination to monitor for toxicity.  Impingement syndrome of right shoulder region-he has limited range of motion of bilateral shoulder joints as described above.  No synovitis was noted.  Osteoarthritis of left glenohumeral joint  Pain in both hands -he had synovial thickening over bilateral wrist joints and bilateral MCP joints.  He had incomplete extension and flexion of PIP and DIP joints with incomplete fist formation.  I will refer him to occupational therapy.  Plan: XR Hand 2 View Right, XR Hand 2 View Left, x-rays showed severe erosive rheumatoid arthritis with fusion of all of the carpal bones.  These findings are suggestive of rheumatoid arthritis or psoriatic arthritis.  Rheumatoid factor, Cyclic citrul peptide antibody, IgG, Uric acid  Contracture of joint of both elbows-no synovitis was noted.  Chronic pain of both knees -he had limited extension of bilateral knee joints.  Warmth was noted but notes swelling was noted.  Patient gets knee joint injections frequently.  Plan: XR KNEE 3 VIEW RIGHT, XR KNEE 3 VIEW LEFT.  Bilateral moderate to severe osteoarthritis and severe chondromalacia patella was noted.  Pain in both feet -he denies much discomfort in his feet.  He had bilateral first MTP thickening and PIP and DIP thickening with  no synovitis.  No plantar fasciitis or Achilles tendinitis was noted.  Plan: XR Foot 2 Views Right, XR Foot 2 Views Left.  X-rays showed erosive inflammatory arthritis.  Pain, neck -he had very limited range of motion of the cervical  spine.  He had not much discomfort with range of motion.  Plan: XR Cervical Spine 2 or 3 views.  Syndesmophytes were noted.  C4-5 and C5-6 severe narrowing with anterior osteophytes were noted.  Chronic midline low back pain without sciatica-he denies any discomfort today.  Essential hypertension-blood pressure was normal at 113/74.  First degree AV block  Mitral valvular prolapse  NSVT (nonsustained ventricular tachycardia) (HCC)  Paroxysmal A-fib (HCC)-is on Eliquis.  Mixed hyperlipidemia  Varicose ulcer of lower extremity, right (HCC)-he is followed by vascular surgery.  He states he developed an ulcer about 6 months ago which is gradually healing.  Gastroesophageal reflux disease without esophagitis  Orders: Orders Placed This Encounter  Procedures   XR Hand 2 View Right   XR Hand 2 View Left   XR KNEE 3 VIEW RIGHT   XR KNEE 3 VIEW LEFT   XR Foot 2 Views Right   XR Foot 2 Views Left   XR Cervical Spine 2 or 3 views   CBC with Differential/Platelet   COMPLETE METABOLIC PANEL WITH GFR   Sedimentation rate   Rheumatoid factor   Cyclic citrul peptide antibody, IgG   Uric acid   Glucose 6 phosphate dehydrogenase   No orders of the defined types were placed in this encounter.     Follow-Up Instructions: Return for Psoriatic arthritis.   Pollyann Savoy, MD  Note - This record has been created using Animal nutritionist.  Chart creation errors have been sought, but may not always  have been located. Such creation errors do not reflect on  the standard of medical care.

## 2023-05-19 DIAGNOSIS — I87391 Chronic venous hypertension (idiopathic) with other complications of right lower extremity: Secondary | ICD-10-CM | POA: Diagnosis not present

## 2023-05-19 DIAGNOSIS — I83891 Varicose veins of right lower extremities with other complications: Secondary | ICD-10-CM | POA: Diagnosis not present

## 2023-05-19 DIAGNOSIS — Z09 Encounter for follow-up examination after completed treatment for conditions other than malignant neoplasm: Secondary | ICD-10-CM | POA: Diagnosis not present

## 2023-05-20 DIAGNOSIS — H35371 Puckering of macula, right eye: Secondary | ICD-10-CM | POA: Diagnosis not present

## 2023-05-21 ENCOUNTER — Ambulatory Visit: Payer: Medicare Other | Admitting: Podiatry

## 2023-05-21 DIAGNOSIS — M79674 Pain in right toe(s): Secondary | ICD-10-CM

## 2023-05-21 DIAGNOSIS — B351 Tinea unguium: Secondary | ICD-10-CM

## 2023-05-21 DIAGNOSIS — Z7901 Long term (current) use of anticoagulants: Secondary | ICD-10-CM

## 2023-05-21 DIAGNOSIS — M79675 Pain in left toe(s): Secondary | ICD-10-CM | POA: Diagnosis not present

## 2023-05-21 NOTE — Progress Notes (Signed)
Subjective: No chief complaint on file.  77 y.o. returns the office today for painful, elongated, thickened toenails which he cannot trim himself. Denies any redness or drainage around the nails.  No open lesions.  No other concerns today.    PCP: Myrlene Broker, MD  He is on Eliquis for A-fib  Objective: AAO 3, NAD DP/PT pulses palpable, CRT less than 3 seconds Nails hypertrophic, dystrophic, elongated, brittle, discolored 10. There is tenderness overlying the nails 1-5 bilaterally. There is no surrounding erythema or drainage along the nail sites.  Slight hyperkeratotic tissue at the distal right 2nd toe, no ulcerations or signs of infection.  Hammertoes present, rigid No pain with calf compression, swelling, warmth, erythema.  Assessment: Patient presents with symptomatic onychomycosis, on Eliquis  Plan: -Treatment options including alternatives, risks, complications were discussed -Nails sharply debrided 10 without complication/bleeding. -Discussed daily foot inspection. If there are any changes, to call the office immediately.  -Follow-up as scheduled or sooner if any problems are to arise. In the meantime, encouraged to call the office with any questions, concerns, changes symptoms.  Return in about 3 months (around 08/21/2023).  Vivi Barrack DPM

## 2023-05-28 ENCOUNTER — Telehealth: Payer: Self-pay | Admitting: Internal Medicine

## 2023-05-28 DIAGNOSIS — I872 Venous insufficiency (chronic) (peripheral): Secondary | ICD-10-CM | POA: Diagnosis not present

## 2023-05-28 DIAGNOSIS — I83892 Varicose veins of left lower extremities with other complications: Secondary | ICD-10-CM | POA: Diagnosis not present

## 2023-05-28 DIAGNOSIS — R600 Localized edema: Secondary | ICD-10-CM | POA: Diagnosis not present

## 2023-05-28 NOTE — Telephone Encounter (Signed)
Patient went to see the vein specialist and had an ultrasound done. He has scheduled and appointment to over the results with you December 3rd

## 2023-05-28 NOTE — Telephone Encounter (Signed)
Called and lvm 1st attempt

## 2023-05-28 NOTE — Telephone Encounter (Signed)
Pt called wanting to speak with a nurse thinking hecraw might have a  blood clot in his leg advise pt to be seen at another location or trying urgent or Er. Pt insisted he wants a call back from nurse.

## 2023-05-28 NOTE — Telephone Encounter (Signed)
Patient is also using compression socks and elevating legs due to swelling

## 2023-06-01 ENCOUNTER — Ambulatory Visit: Payer: Medicare Other

## 2023-06-01 ENCOUNTER — Ambulatory Visit: Payer: Medicare Other | Attending: Rheumatology | Admitting: Rheumatology

## 2023-06-01 ENCOUNTER — Encounter: Payer: Self-pay | Admitting: Rheumatology

## 2023-06-01 VITALS — BP 113/74 | HR 80 | Resp 12 | Ht 67.5 in | Wt 165.6 lb

## 2023-06-01 DIAGNOSIS — I10 Essential (primary) hypertension: Secondary | ICD-10-CM | POA: Diagnosis not present

## 2023-06-01 DIAGNOSIS — E782 Mixed hyperlipidemia: Secondary | ICD-10-CM

## 2023-06-01 DIAGNOSIS — L4050 Arthropathic psoriasis, unspecified: Secondary | ICD-10-CM

## 2023-06-01 DIAGNOSIS — M79671 Pain in right foot: Secondary | ICD-10-CM | POA: Diagnosis not present

## 2023-06-01 DIAGNOSIS — M7541 Impingement syndrome of right shoulder: Secondary | ICD-10-CM

## 2023-06-01 DIAGNOSIS — L97919 Non-pressure chronic ulcer of unspecified part of right lower leg with unspecified severity: Secondary | ICD-10-CM

## 2023-06-01 DIAGNOSIS — G8929 Other chronic pain: Secondary | ICD-10-CM

## 2023-06-01 DIAGNOSIS — M79672 Pain in left foot: Secondary | ICD-10-CM | POA: Diagnosis not present

## 2023-06-01 DIAGNOSIS — M24521 Contracture, right elbow: Secondary | ICD-10-CM

## 2023-06-01 DIAGNOSIS — M25561 Pain in right knee: Secondary | ICD-10-CM

## 2023-06-01 DIAGNOSIS — M79642 Pain in left hand: Secondary | ICD-10-CM | POA: Diagnosis not present

## 2023-06-01 DIAGNOSIS — M542 Cervicalgia: Secondary | ICD-10-CM

## 2023-06-01 DIAGNOSIS — K219 Gastro-esophageal reflux disease without esophagitis: Secondary | ICD-10-CM

## 2023-06-01 DIAGNOSIS — M545 Low back pain, unspecified: Secondary | ICD-10-CM | POA: Diagnosis not present

## 2023-06-01 DIAGNOSIS — Z79899 Other long term (current) drug therapy: Secondary | ICD-10-CM

## 2023-06-01 DIAGNOSIS — M25562 Pain in left knee: Secondary | ICD-10-CM | POA: Diagnosis not present

## 2023-06-01 DIAGNOSIS — M19012 Primary osteoarthritis, left shoulder: Secondary | ICD-10-CM

## 2023-06-01 DIAGNOSIS — M79641 Pain in right hand: Secondary | ICD-10-CM | POA: Diagnosis not present

## 2023-06-01 DIAGNOSIS — I341 Nonrheumatic mitral (valve) prolapse: Secondary | ICD-10-CM

## 2023-06-01 DIAGNOSIS — I4729 Other ventricular tachycardia: Secondary | ICD-10-CM

## 2023-06-01 DIAGNOSIS — I480 Paroxysmal atrial fibrillation: Secondary | ICD-10-CM

## 2023-06-01 DIAGNOSIS — M24522 Contracture, left elbow: Secondary | ICD-10-CM

## 2023-06-01 DIAGNOSIS — I440 Atrioventricular block, first degree: Secondary | ICD-10-CM | POA: Diagnosis not present

## 2023-06-01 DIAGNOSIS — I83019 Varicose veins of right lower extremity with ulcer of unspecified site: Secondary | ICD-10-CM

## 2023-06-01 NOTE — Progress Notes (Signed)
Pharmacy Note  Subjective: Patient presents today to [facility] for follow up office visit.   Patient seen by the pharmacist for counseling on hydroxychloroquine for psoriatic arthritis.  He's been stable on treatment   Objective: CMP     Component Value Date/Time   NA 141 08/24/2022 0956   NA 143 01/29/2020 1108   K 4.1 08/24/2022 0956   CL 103 08/24/2022 0956   CO2 29 08/24/2022 0956   GLUCOSE 85 08/24/2022 0956   BUN 17 08/24/2022 0956   BUN 16 01/29/2020 1108   CREATININE 0.90 08/24/2022 0956   CALCIUM 10.0 08/24/2022 0956   PROT 6.8 04/02/2023 1159   ALBUMIN 4.2 04/02/2023 1159   AST 35 04/02/2023 1159   ALT 34 04/02/2023 1159   ALKPHOS 104 04/02/2023 1159   BILITOT 0.9 04/02/2023 1159   GFRNONAA >60 01/23/2021 1530   GFRAA 98 01/29/2020 1108    CBC    Component Value Date/Time   WBC 5.2 08/24/2022 0956   RBC 4.87 08/24/2022 0956   HGB 13.7 08/24/2022 0956   HGB 13.0 01/29/2020 1108   HCT 42.1 08/24/2022 0956   HCT 41.2 01/29/2020 1108   PLT 185.0 08/24/2022 0956   PLT 250 01/29/2020 1108   MCV 86.5 08/24/2022 0956   MCV 86 01/29/2020 1108   MCH 27.0 01/29/2020 1108   MCH 30.0 02/21/2015 1407   MCHC 32.6 08/24/2022 0956   RDW 15.1 08/24/2022 0956   RDW 13.9 01/29/2020 1108    Assessment/Plan: Patient was counseled on the purpose, proper use, and adverse effects of hydroxychloroquine including nausea/diarrhea, skin rash, headaches, and sun sensitivity.  Advised patient to wear sunscreen once starting hydroxychloroquine to reduce risk of rash associated with sun sensitivity.  Discussed importance of annual eye exams while on hydroxychloroquine to monitor to ocular toxicity and discussed importance of frequent laboratory monitoring.  Provided patient with eye exam form for baseline ophthalmologic exam.  Provided patient with educational materials on hydroxychloroquine and answered all questions.  Patient consented to hydroxychloroquine. Will upload  consent in the media tab.    Reviewed risk for QTC prolongation when used in combination with other QTc prolonging agents (including but not limited to antiarrhythmics, macrolide antibiotics, flouroquinolone antibiotics, haloperidol, quetiapine, olanzapine, risperidone, droperidol, ziprasidone, amitriptyline, citalopram, ondansetron, migraine triptans, and methadone).   Dose will be Plaquenil 200 mg twice daily.  Chesley Mires, PharmD, MPH, BCPS, CPP Clinical Pharmacist (Rheumatology and Pulmonology)

## 2023-06-01 NOTE — Patient Instructions (Addendum)
Hydroxychloroquine Tablets What is this medication? HYDROXYCHLOROQUINE (hye drox ee KLOR oh kwin) treats autoimmune conditions, such as rheumatoid arthritis and lupus. It works by slowing down an overactive immune system. It may also be used to prevent and treat malaria. It works by killing the parasite that causes malaria. It belongs to a group of medications called DMARDs. This medicine may be used for other purposes; ask your health care provider or pharmacist if you have questions. COMMON BRAND NAME(S): Plaquenil, Quineprox, SOVUNA What should I tell my care team before I take this medication? They need to know if you have any of these conditions: Diabetes Eye disease, vision problems Frequently drink alcohol G6PD deficiency Heart disease Irregular heartbeat or rhythm Kidney disease Liver disease Porphyria Psoriasis An unusual or allergic reaction to hydroxychloroquine, other medications, foods, dyes, or preservatives Pregnant or trying to get pregnant Breastfeeding How should I use this medication? Take this medication by mouth with water. Take it as directed on the prescription label. Do not cut, crush, or chew this medication. Swallow the tablets whole. Take it with food. Do not take it more than directed. Take all of this medication unless your care team tells you to stop it early. Keep taking it even if you think you are better. Take products with antacids in them at a different time of day than this medication. Take this medication 4 hours before or 4 hours after antacids. Talk to your care team if you have questions. Talk to your care team about the use of this medication in children. While this medication may be prescribed for selected conditions, precautions do apply. Overdosage: If you think you have taken too much of this medicine contact a poison control center or emergency room at once. NOTE: This medicine is only for you. Do not share this medicine with others. What if I  miss a dose? If you miss a dose, take it as soon as you can. If it is almost time for your next dose, take only that dose. Do not take double or extra doses. What may interact with this medication? Do not take this medication with any of the following: Cisapride Dronedarone Pimozide Thioridazine This medication may also interact with the following: Ampicillin Antacids Cimetidine Cyclosporine Digoxin Kaolin Medications for diabetes, such as insulin, glipizide, glyburide Medications for seizures, such as carbamazepine, phenobarbital, phenytoin Mefloquine Methotrexate Other medications that cause heart rhythm changes Praziquantel This list may not describe all possible interactions. Give your health care provider a list of all the medicines, herbs, non-prescription drugs, or dietary supplements you use. Also tell them if you smoke, drink alcohol, or use illegal drugs. Some items may interact with your medicine. What should I watch for while using this medication? Visit your care team for regular checks on your progress. Tell your care team if your symptoms do not start to get better or if they get worse. You may need blood work done while you are taking this medication. If you take other medications that can affect heart rhythm, you may need more testing. Talk to your care team if you have questions. Your vision may be tested before and during use of this medication. Tell your care team right away if you have any change in your eyesight. This medication may cause serious skin reactions. They can happen weeks to months after starting the medication. Contact your care team right away if you notice fevers or flu-like symptoms with a rash. The rash may be red or purple and then  turn into blisters or peeling of the skin. Or, you might notice a red rash with swelling of the face, lips or lymph nodes in your neck or under your arms. If you or your family notice any changes in your behavior, such as  new or worsening depression, thoughts of harming yourself, anxiety, or other unusual or disturbing thoughts, or memory loss, call your care team right away. What side effects may I notice from receiving this medication? Side effects that you should report to your care team as soon as possible: Allergic reactions--skin rash, itching, hives, swelling of the face, lips, tongue, or throat Aplastic anemia--unusual weakness or fatigue, dizziness, headache, trouble breathing, increased bleeding or bruising Change in vision Heart rhythm changes--fast or irregular heartbeat, dizziness, feeling faint or lightheaded, chest pain, trouble breathing Infection--fever, chills, cough, or sore throat Low blood sugar (hypoglycemia)--tremors or shaking, anxiety, sweating, cold or clammy skin, confusion, dizziness, rapid heartbeat Muscle injury--unusual weakness or fatigue, muscle pain, dark yellow or brown urine, decrease in amount of urine Pain, tingling, or numbness in the hands or feet Rash, fever, and swollen lymph nodes Redness, blistering, peeling, or loosening of the skin, including inside the mouth Thoughts of suicide or self-harm, worsening mood, or feelings of depression Unusual bruising or bleeding Side effects that usually do not require medical attention (report to your care team if they continue or are bothersome): Diarrhea Headache Nausea Stomach pain Vomiting This list may not describe all possible side effects. Call your doctor for medical advice about side effects. You may report side effects to FDA at 1-800-FDA-1088. Where should I keep my medication? Keep out of the reach of children and pets. Store at room temperature up to 30 degrees C (86 degrees F). Protect from light. Get rid of any unused medication after the expiration date. To get rid of medications that are no longer needed or have expired: Take the medication to a medication take-back program. Check with your pharmacy or law  enforcement to find a location. If you cannot return the medication, check the label or package insert to see if the medication should be thrown out in the garbage or flushed down the toilet. If you are not sure, ask your care team. If it is safe to put it in the trash, empty the medication out of the container. Mix the medication with cat litter, dirt, coffee grounds, or other unwanted substance. Seal the mixture in a bag or container. Put it in the trash. NOTE: This sheet is a summary. It may not cover all possible information. If you have questions about this medicine, talk to your doctor, pharmacist, or health care provider. Standing Labs We placed an order today for your standing lab work.   Please have your standing labs drawn in  April and every 5 months  Please have your labs drawn 2 weeks prior to your appointment so that the provider can discuss your lab results at your appointment, if possible.  Please note that you may see your imaging and lab results in MyChart before we have reviewed them. We will contact you once all results are reviewed. Please allow our office up to 72 hours to thoroughly review all of the results before contacting the office for clarification of your results.  WALK-IN LAB HOURS  Monday through Thursday from 8:00 am -12:30 pm and 1:00 pm-5:00 pm and Friday from 8:00 am-12:00 pm.  Patients with office visits requiring labs will be seen before walk-in labs.  You may encounter longer  than normal wait times. Please allow additional time. Wait times may be shorter on  Monday and Thursday afternoons.  We do not book appointments for walk-in labs. We appreciate your patience and understanding with our staff.   Vaccines You are taking a medication(s) that can suppress your immune system.  The following immunizations are recommended: Flu annually Covid-19  RSV Td/Tdap (tetanus, diphtheria, pertussis) every 10 years Pneumonia (Prevnar 15 then Pneumovax 23 at least 1  year apart.  Alternatively, can take Prevnar 20 without needing additional dose) Shingrix: 2 doses from 4 weeks to 6 months apart  Please check with your PCP to make sure you are up to date.   Labs are drawn by [facility]. Please bring your co-pay at the time of your lab draw.  You may receive a bill from [facility] for your lab work.  Please note if you are on Hydroxychloroquine and and an order has been placed for a Hydroxychloroquine level,  you will need to have it drawn 4 hours or more after your last dose.  If you wish to have your labs drawn at another location, please call the office 24 hours in advance so we can fax the orders.  The office is located at [address], [address], [place], [place] [address]   If you have any questions regarding directions or hours of operation,  please call [phone].   As a reminder, please drink plenty of water prior to coming for your lab work. Thanks!

## 2023-06-01 NOTE — Addendum Note (Signed)
Addended by: Henriette Combs on: 06/01/2023 02:07 PM   Modules accepted: Orders

## 2023-06-02 ENCOUNTER — Telehealth: Payer: Self-pay | Admitting: Cardiology

## 2023-06-02 DIAGNOSIS — I87391 Chronic venous hypertension (idiopathic) with other complications of right lower extremity: Secondary | ICD-10-CM | POA: Diagnosis not present

## 2023-06-02 DIAGNOSIS — I83891 Varicose veins of right lower extremities with other complications: Secondary | ICD-10-CM | POA: Diagnosis not present

## 2023-06-02 NOTE — Telephone Encounter (Signed)
Pt c/o medication issue:  1. Name of Medication: ELIQUIS 5 MG TABS tablet   2. How are you currently taking this medication (dosage and times per day)? TAKE 1 TABLET BY MOUTH TWICE DAILY   3. Are you having a reaction (difficulty breathing--STAT)? No  4. What is your medication issue? Pt states that he thinks that he took 2 tablets of medication about 30 minutes ago. He would like a c/b regarding this matter. Please advise

## 2023-06-02 NOTE — Telephone Encounter (Signed)
Called and spoke to patient who states he think he took 2 Eliquis 5 mg this afternoon. Patient was loading his pill box and could not remember if he already had a pill in the pill box and added another one. He wasn't at home to count his pills. Advised patient per Laural Golden, RPH to hold tonight's dose and restart regular schedule back tomorrow. Also advised to watch for blood in his urine and stool, nose bleeds and vomiting blood. Patient to if he notice any of those to call the office back or go to the ED. Patient verbalized understanding and agree.

## 2023-06-02 NOTE — Progress Notes (Signed)
Hemoglobin is mildly decreased.  CMP normal, sed rate normal, RF negative, uric acid in the desirable range.  G6PD and anti-CCP are pending.

## 2023-06-04 LAB — GLUCOSE 6 PHOSPHATE DEHYDROGENASE: G-6PDH: 16.8 U/g{Hb} (ref 7.0–20.5)

## 2023-06-04 LAB — CBC WITH DIFFERENTIAL/PLATELET
Absolute Lymphocytes: 1161 {cells}/uL (ref 850–3900)
Absolute Monocytes: 583 {cells}/uL (ref 200–950)
Basophils Absolute: 32 {cells}/uL (ref 0–200)
Basophils Relative: 0.6 %
Eosinophils Absolute: 108 {cells}/uL (ref 15–500)
Eosinophils Relative: 2 %
HCT: 40.8 % (ref 38.5–50.0)
Hemoglobin: 12.9 g/dL — ABNORMAL LOW (ref 13.2–17.1)
MCH: 29.1 pg (ref 27.0–33.0)
MCHC: 31.6 g/dL — ABNORMAL LOW (ref 32.0–36.0)
MCV: 91.9 fL (ref 80.0–100.0)
MPV: 10.7 fL (ref 7.5–12.5)
Monocytes Relative: 10.8 %
Neutro Abs: 3515 {cells}/uL (ref 1500–7800)
Neutrophils Relative %: 65.1 %
Platelets: 168 10*3/uL (ref 140–400)
RBC: 4.44 10*6/uL (ref 4.20–5.80)
RDW: 12.6 % (ref 11.0–15.0)
Total Lymphocyte: 21.5 %
WBC: 5.4 10*3/uL (ref 3.8–10.8)

## 2023-06-04 LAB — SEDIMENTATION RATE: Sed Rate: 9 mm/h (ref 0–20)

## 2023-06-04 LAB — COMPLETE METABOLIC PANEL WITH GFR
AG Ratio: 1.6 (calc) (ref 1.0–2.5)
ALT: 35 U/L (ref 9–46)
AST: 33 U/L (ref 10–35)
Albumin: 4.6 g/dL (ref 3.6–5.1)
Alkaline phosphatase (APISO): 120 U/L (ref 35–144)
BUN: 16 mg/dL (ref 7–25)
CO2: 30 mmol/L (ref 20–32)
Calcium: 9.8 mg/dL (ref 8.6–10.3)
Chloride: 105 mmol/L (ref 98–110)
Creat: 0.78 mg/dL (ref 0.70–1.28)
Globulin: 2.9 g/dL (ref 1.9–3.7)
Glucose, Bld: 53 mg/dL — ABNORMAL LOW (ref 65–99)
Potassium: 4 mmol/L (ref 3.5–5.3)
Sodium: 142 mmol/L (ref 135–146)
Total Bilirubin: 1.1 mg/dL (ref 0.2–1.2)
Total Protein: 7.5 g/dL (ref 6.1–8.1)
eGFR: 93 mL/min/{1.73_m2} (ref >=60)

## 2023-06-04 LAB — RHEUMATOID FACTOR: Rheumatoid fact SerPl-aCnc: <10 [IU]/mL (ref <14)

## 2023-06-04 LAB — URIC ACID: Uric Acid, Serum: 4.4 mg/dL (ref 4.0–8.0)

## 2023-06-04 LAB — CYCLIC CITRUL PEPTIDE ANTIBODY, IGG: Cyclic Citrullin Peptide Ab: <16 U

## 2023-06-07 ENCOUNTER — Ambulatory Visit: Payer: Medicare Other | Admitting: Dermatology

## 2023-06-07 ENCOUNTER — Encounter: Payer: Self-pay | Admitting: Dermatology

## 2023-06-07 VITALS — BP 104/68

## 2023-06-07 DIAGNOSIS — L308 Other specified dermatitis: Secondary | ICD-10-CM

## 2023-06-07 DIAGNOSIS — L409 Psoriasis, unspecified: Secondary | ICD-10-CM

## 2023-06-07 MED ORDER — CLOBETASOL PROPIONATE 0.05 % EX CREA: 1.0000 | TOPICAL_CREAM | Freq: Two times a day (BID) | CUTANEOUS | 2 refills | Status: AC

## 2023-06-07 NOTE — Progress Notes (Signed)
G6PD normal, anti-CCP negative.

## 2023-06-07 NOTE — Progress Notes (Signed)
   New Patient Visit   Subjective  [name] is a 77 y.o. male who presents for the following: Rash of left upper arm - biopsied in the past by Dr Jorja Loa - AK with nummular dermatitis. He has taken [place] in the past but he lost a lot of weight so he stopped.    The following portions of the chart were reviewed this encounter and updated as appropriate: medications, allergies, medical history  Review of Systems:  No other skin or systemic complaints except as noted in HPI or Assessment and Plan.  Objective  Well appearing patient in no apparent distress; mood and affect are within normal limits.    A focused examination was performed of the following areas: Trunk, arms, buttocks   Relevant exam findings are noted in the Assessment and Plan.        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 A previous biopsy 3 years ago showed a small precancerous area and mostly inflammation, initially diagnosed as nummular dermatitis. The patient has a history of using Otezla for psoriasis, which caused significant weight loss and gastrointestinal side effects. The treatment plan includes clobetasol cream, Aquaphor ointment, DHS Zinc shampoo, and clobetasol drops, with a follow-up in 6 weeks to consider injectable biologics if topical treatments are ineffective.  1. Psoriatic Dermatitis - Assessment: Patient presents with a 4-centimeter round pink scaly plaque with satellite scaly plaques on the left arm. Similar lesions noted on back, buttocks, and scalp. Previous biopsy from 3 years ago showed a small precancerous area with mostly inflammation. Initial diagnosis of nummular dermatitis revised to psoriatic dermatitis based on widespread distribution. BSA noted as 12 percent. -  Plan:   - Prescribe clobetasol cream for application twice daily for 2 weeks on all affected areas.   - After 2 weeks of clobetasol, use Aquaphor ointment for 2 weeks.   - Prescribe DHS Zinc shampoo for scalp (to be purchased from [employer]).   - Prescribe clobetasol drops for scalp application.   - Follow-up appointment in 6 weeks.   - Consider oral medication if topical treatment ineffective.  2. History of Otezla Use - Assessment: Patient reports previous trial of Otezla for psoriasis treatment. Medication was effective but caused significant weight loss (from 220 pounds to 160 pounds) within a month, along with possible diarrhea. - Plan:   - Consider alternative injectable biologics (e.g., Sandford Craze) if topical treatment ineffective.   - Discuss injectable options at 6-week follow-up appointment.    Return in about 6 weeks (around 07/19/2023).  I, Joanie Coddington, CMA, am acting as scribe for [employer], DO .   Documentation: I have reviewed the above documentation for accuracy and completeness, and I agree with the above.  Langston Reusing, DO

## 2023-06-07 NOTE — Patient Instructions (Addendum)
  Hello Hartman,  Thank you for visiting [place] today. We appreciate your commitment to addressing your skin concerns and improving your health. Here is a summary of the key instructions from today's consultation:  - Medication: Start applying clobetasol cream to all affected areas, including the large patch and smaller ones on your arm, buttocks, and back. This should be done twice daily.   - Duration: Use the clobetasol cream for two weeks, followed by a two-week break during which you should use Aquaphor ointment.  - Shampoo: For your scalp, use DHS Zinc shampoo, available for purchase on [employer].  - Follow-Up: We have scheduled a follow-up appointment in six weeks to assess your progress and discuss the potential need for oral medication.  - Additional Care: When applying clobetasol to your scalp, ensure to use only a thin layer and do not rinse it off. Although it is safe around pets, please prevent them from licking the treated areas.  Your prescription has been sent to [facility] on [address]. It is crucial to start the treatment as advised and closely monitor your symptoms. We look forward to seeing you in six weeks to evaluate the effectiveness of the treatment and explore further options if necessary.  Warm regards,  Dr. [facility] Re[facility]

## 2023-06-07 NOTE — Progress Notes (Deleted)
   New Patient Visit   Subjective  Richard [name] is a 77 y.o. male who presents for the following: Skin Cancer Screening and Upper Body Skin Exam - No history of skin cancer.  Rash of left upper arm - biopsied in the past by Dr Jorja Loa - AK with nummular dermatitis  The patient presents for Upper Body Skin Exam (UBSE) for skin cancer screening and mole check. The patient has spots, moles and lesions to be evaluated, some may be new or changing and the patient may have concern these could be cancer.    The following portions of the chart were reviewed this encounter and updated as appropriate: medications, allergies, medical history  Review of Systems:  No other skin or systemic complaints except as noted in HPI or Assessment and Plan.  Objective  Well appearing patient in no apparent distress; mood and affect are within normal limits.  All skin waist up examined. Relevant physical exam findings are noted in the Assessment and Plan.    Assessment & Plan    Skin cancer screening performed today.  Actinic Damage - Chronic condition, secondary to cumulative UV/sun exposure - diffuse scaly erythematous macules with underlying dyspigmentation - Recommend daily broad spectrum sunscreen SPF 30+ to sun-exposed areas, reapply every 2 hours as needed.  - Staying in the shade or wearing long sleeves, sun glasses (UVA+UVB protection) and wide brim hats (4-inch brim around the entire circumference of the hat) are also recommended for sun protection.  - Call for new or changing lesions.  Lentigines, Seborrheic Keratoses, Hemangiomas - Benign normal skin lesions - Benign-appearing - Call for any changes  Melanocytic Nevi - Tan-brown and/or pink-flesh-colored symmetric macules and papules - Benign appearing on exam today - Observation - Call clinic for new or changing moles - Recommend daily use of broad spectrum spf 30+ sunscreen to sun-exposed areas.    No follow-ups on file.  I,  Joanie Coddington, CMA, am acting as scribe for [employer], DO .   Documentation: I have reviewed the above documentation for accuracy and completeness, and I agree with the above.  Langston Reusing, DO

## 2023-06-08 ENCOUNTER — Ambulatory Visit: Payer: Medicare Other | Admitting: Internal Medicine

## 2023-06-08 ENCOUNTER — Encounter: Payer: Self-pay | Admitting: Internal Medicine

## 2023-06-08 VITALS — BP 114/82 | HR 90 | Temp 97.6°F | Ht 67.5 in | Wt 169.0 lb

## 2023-06-08 DIAGNOSIS — H2512 Age-related nuclear cataract, left eye: Secondary | ICD-10-CM | POA: Diagnosis not present

## 2023-06-08 DIAGNOSIS — H25012 Cortical age-related cataract, left eye: Secondary | ICD-10-CM | POA: Diagnosis not present

## 2023-06-08 DIAGNOSIS — H25042 Posterior subcapsular polar age-related cataract, left eye: Secondary | ICD-10-CM | POA: Diagnosis not present

## 2023-06-08 DIAGNOSIS — I872 Venous insufficiency (chronic) (peripheral): Secondary | ICD-10-CM | POA: Diagnosis not present

## 2023-06-08 NOTE — Progress Notes (Unsigned)
   Subjective:   Patient ID: Richard [name], male    DOB: 1946/01/09, 77 y.o.   MRN: [id number]  HPI The patient is a 77 YO man coming in for advice about recent vascular visit and [place]. We do not have notes from the visit. We do have the [place] test in media.   Review of Systems  Objective:  Physical Exam  Vitals:   06/08/23 1055  BP: 114/82  Pulse: 90  Temp: 97.6 F (36.4 C)  TempSrc: Oral  SpO2: 98%  Weight: 169 lb (76.7 kg)  Height: 5' 7.5" (1.715 m)    Assessment & Plan:

## 2023-06-09 ENCOUNTER — Encounter: Payer: Self-pay | Admitting: Internal Medicine

## 2023-06-09 ENCOUNTER — Encounter: Payer: Medicare Other | Admitting: Rehabilitative and Restorative Service Providers"

## 2023-06-09 NOTE — Assessment & Plan Note (Signed)
Reviewed [place] with reflux in right leg. Do not have [place] left leg to review with patient. He has had intervention on right leg and noticed improvement in swelling. Discussed compression stockings, reduction in salt intake, increase in water intake to help.

## 2023-06-10 DIAGNOSIS — H35371 Puckering of macula, right eye: Secondary | ICD-10-CM | POA: Diagnosis not present

## 2023-06-11 NOTE — Therapy (Incomplete)
OUTPATIENT OCCUPATIONAL THERAPY ORTHO EVALUATION  Patient Name: Richard [name] MRN: [id number] DOB:May 16, 1946, 77 y.o., male Today's Date: 06/11/2023  PCP: Hillard Danker, MD REFERRING PROVIDER: Pollyann Savoy, MD   END OF SESSION:   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    Atrial fibrillation (HCC)    Back pain    Dyspnea    history of covid 09/2018 and with extertion    Dysrhythmia    GERD (gastroesophageal reflux disease)    Headache    Hypertension    Pain    PAIN AND OA RT KNEE   Pneumonia    YEARS AGO   Psoriasis    arthritis   Snoring 04/27/2014   Past Surgical History:  Procedure Laterality Date   INGUINAL HERNIA REPAIR Left 01/29/2021   Procedure: LEFT INGUINAL HERNIA REPAIR WITH MESH;  Surgeon: Emelia Loron, MD;  Location:  [facility];  Service: General;  Laterality: Left;   KNEE ARTHROSCOPY Right 01/31/2014   Procedure: RIGHT ARTHROSCOPY KNEE WITH DEBRIDMENT CHONDROPLASTY;  Surgeon: Loanne Drilling, MD;  Location: [facility];  Service: Orthopedics;  Laterality: Right;   LUMP REMOVED FROM LEFT BREAST Left 07/06/2006   STATES SURGERY  AT [facility] - LOCAL- NOT CANCER 5 OR MORE YRS AGO   MORTON'S NEUROMA REMOVED     TONSILLECTOMY     AS A CHILD   TRANSTHORACIC ECHOCARDIOGRAM  07/2020   Normal LV size and function.  GR 1 DD.  Mild RV enlargement.  Moderate LA & RA enlargement.  Mild MR.  MR likely related to mildly for MR/MVP.  Moderate TR, mildly elevated RAP.Marland Kitchen  Moderate ao valve thickening.  Mild AOV sclerosis but no stenosis.   Patient Active Problem List   Diagnosis Date Noted   Cough 10/16/2022   Routine general medical examination at a health care facility 08/28/2022   Vertigo 08/28/2022   Venous insufficiency 08/28/2022   Mitral valvular prolapse 07/03/2022   Impaired fasting blood sugar 03/06/2022   Paroxysmal A-fib (HCC) 02/27/2022   Impingement syndrome of shoulder  region 02/23/2022   Osteoarthritis of left glenohumeral joint 02/23/2022   Bilateral hearing loss 08/14/2020   Cough, persistent 08/14/2020   Deviated septum 08/14/2020   First degree AV block 08/24/2019   NSVT (nonsustained ventricular tachycardia) (HCC) 07/25/2019   Mixed hyperlipidemia 07/25/2019   Essential hypertension 07/03/2018   Impingement syndrome of right shoulder region 11/10/2017   Gastroesophageal reflux disease without esophagitis 03/10/2017   Rhinitis, chronic 03/10/2017   Chronic low back pain without sciatica 12/17/2015   Bilateral knee pain 12/17/2015   Psoriatic arthritis (HCC) 12/17/2015   Snoring 04/27/2014    ONSET DATE: chronic  REFERRING DIAG:  L40.50 (ICD-10-CM) - Psoriatic arthritis (HCC)  M79.641,M79.642 (ICD-10-CM) - Pain in both hands    THERAPY DIAG:  No diagnosis found.  Rationale for Evaluation and Treatment: Rehabilitation  SUBJECTIVE:   SUBJECTIVE STATEMENT: He states ***.   He is retired Music therapist. He enjoys walking and lifting weights. He was accompanied by his wife Carney Bern today.   Pt accompanied by: {accompnied:27141}  PERTINENT HISTORY: ***  PRECAUTIONS: {Therapy precautions:24002}  RED FLAGS: {PT Red Flags:29287}   WEIGHT BEARING RESTRICTIONS: {Yes ***/No:24003}  PAIN:  Are you having pain? Yes: NPRS scale: ***/10 Pain location: *** Pain description: *** Aggravating factors: *** Relieving factors: ***  FALLS: Has patient fallen in last 6 months? {fallsyesno:27318}  LIVING ENVIRONMENT: Lives with: {OPRC lives with:25569::"lives  with their family"} Lives in: {Lives in:25570} Stairs: {opstairs:27293} Has following equipment at home: {Assistive devices:23999}  PLOF: {PLOF:24004}  PATIENT GOALS: ***  NEXT MD VISIT: ***   OBJECTIVE: (All objective assessments below are from initial evaluation on: 06/14/23 unless otherwise specified.)   HAND DOMINANCE: Right ***  ADLs: Overall ADLs: States decreased ability to  grab, hold household objects, pain and difficulty to open containers, perform FMS tasks (manipulate fasteners on clothing), mild to moderate bathing problems as well. ***   FUNCTIONAL OUTCOME MEASURES: Eval: Quick DASH ***% impairment today  (Higher % Score  =  More Impairment)    Patient Specific Functional Scale: *** (***, ***, ***)  (Higher Score  =  Better Ability for the Selected Tasks)     Patient Rated Wrist Evaluation (PRWE): Pain: ***/50; Function: ***/50; Total Score: ***/100 (Higher Score  =  More Pain and/or Debility)    UPPER EXTREMITY ROM     Shoulder to Wrist AROM Right eval Left eval  Shoulder flexion    Shoulder abduction    Shoulder extension    Shoulder internal rotation    Shoulder external rotation    Elbow flexion    Elbow extension    Forearm supination    Forearm pronation     Wrist flexion    Wrist extension    Wrist ulnar deviation    Wrist radial deviation    Functional dart thrower's motion (F-DTM) in ulnar flexion    F-DTM in radial extension     (Blank rows = not tested)   Hand AROM Right eval Left eval  Full Fist Ability (or Gap to Distal Palmar Crease)    Thumb Opposition  (Kapandji Scale)     Thumb MCP (0-60)    Thumb IP (0-80)    Thumb Radial Abduction Span     Thumb Palmar Abduction Span     Index MCP (0-90)     Index PIP (0-100)     Index DIP (0-70)      Long MCP (0-90)      Long PIP (0-100)      Long DIP (0-70)      Ring MCP (0-90)      Ring PIP (0-100)      Ring DIP (0-70)      Little MCP (0-90)      Little PIP (0-100)      Little DIP (0-70)      (Blank rows = not tested)   UPPER EXTREMITY MMT:    Eval: *** NT at eval due to recent and still healing injuries. Will be tested when appropriate.   MMT Right TBD Left TBD  Shoulder flexion    Shoulder abduction    Shoulder adduction    Shoulder extension    Shoulder internal rotation    Shoulder external rotation    Middle trapezius    Lower trapezius    Elbow  flexion    Elbow extension    Forearm supination    Forearm pronation    Wrist flexion    Wrist extension    Wrist ulnar deviation    Wrist radial deviation    (Blank rows = not tested)  HAND FUNCTION: Eval: Observed weakness in affected *** hand.  Grip strength Right: *** lbs, Left: *** lbs   COORDINATION: Eval: Observed coordination impairments with affected *** hand. Box and Blocks Test: *** Blocks today (*** is [facility]); 9 Hole Peg Test Right: ***sec, Left: *** sec (*** sec is [facility])  SENSATION: Eval: *** Light touch intact today, though diminished around sx area    EDEMA:   Eval: *** Mildly swollen in *** hand and wrist today, ***cm circumferentially around ***  COGNITION: Eval: Overall cognitive status: WFL for evaluation today ***  OBSERVATIONS:   Eval: ***   TODAY'S TREATMENT:  Post-evaluation treatment: ***    PATIENT EDUCATION: Education details: See tx section above for details  Person educated: Patient Education method: Engineer, structural, Teach back, Handouts  Education comprehension: States and demonstrates understanding, Additional Education required    HOME EXERCISE PROGRAM: See tx section above for details    GOALS: Goals reviewed with patient? Yes   SHORT TERM GOALS: (STG required if POC>30 days) Target Date: ***  Pt will obtain protective, custom orthotic. Goal status: TBD/PRN,  MET ***  2.  Pt will demo/state understanding of initial HEP to improve pain levels and prerequisite motion. Goal status: INITIAL   LONG TERM GOALS: Target Date: ***  Pt will improve functional ability by decreased impairment per Quick DASH / PSFS / PRWE assessment from *** to *** or better, for better quality of life. Goal status: INITIAL  2.  Pt will improve grip strength in *** hand from ***lbs to at least ***lbs for functional use at home and in IADLs. Goal status: INITIAL  3.  Pt will improve A/ROM in *** from *** to at least ***, to have functional  motion for tasks like reach and grasp.  Goal status: INITIAL  4.  Pt will improve strength in *** from *** MMT to at least *** MMT to have increased functional ability to carry out selfcare and higher-level homecare tasks with less difficulty. Goal status: INITIAL  5.  Pt will improve coordination skills in ***, as seen by better score on *** testing to have increased functional ability to carry out fine motor tasks (fasteners, etc.) and more complex, coordinated IADLs (meal prep, sports, etc.).  Goal status: INITIAL  6.  Pt will decrease pain at worst from ***/10 to ***/10 or better to have better sleep and occupational participation in daily roles. Goal status: INITIAL   ASSESSMENT:  CLINICAL IMPRESSION: Patient is a 77 y.o. male who was seen today for occupational therapy evaluation for ***.   PERFORMANCE DEFICITS: in functional skills including {OT physical skills:25468}, cognitive skills including {OT cognitive skills:25469}, and psychosocial skills including {OT psychosocial skills:25470}.   IMPAIRMENTS: are limiting patient from {OT performance deficits:25471}.   COMORBIDITIES: {Comorbidities:25485} that affects occupational performance. Patient will benefit from skilled OT to address above impairments and improve overall function.  MODIFICATION OR ASSISTANCE TO COMPLETE EVALUATION: {OT modification:25474}  OT OCCUPATIONAL PROFILE AND HISTORY: {OT PROFILE AND HISTORY:25484}  CLINICAL DECISION MAKING: {OT CDM:25475}  REHAB POTENTIAL: {rehabpotential:25112}  EVALUATION COMPLEXITY: {Evaluation complexity:25115}      PLAN:  OT FREQUENCY: {rehab frequency:25116}  OT DURATION: {rehab duration:25117}  PLANNED INTERVENTIONS: {OT Interventions:25467}  RECOMMENDED OTHER SERVICES: ***  CONSULTED AND AGREED WITH PLAN OF CARE: {OZH:08657}  PLAN FOR NEXT SESSION: ***   Fannie Knee, OTR/L, CHT 06/11/2023, 12:27 PM

## 2023-06-14 ENCOUNTER — Encounter: Payer: Medicare Other | Admitting: Rehabilitative and Restorative Service Providers"

## 2023-06-16 DIAGNOSIS — H2512 Age-related nuclear cataract, left eye: Secondary | ICD-10-CM | POA: Diagnosis not present

## 2023-06-18 DIAGNOSIS — I83891 Varicose veins of right lower extremities with other complications: Secondary | ICD-10-CM | POA: Diagnosis not present

## 2023-06-18 DIAGNOSIS — I87391 Chronic venous hypertension (idiopathic) with other complications of right lower extremity: Secondary | ICD-10-CM | POA: Diagnosis not present

## 2023-06-23 DIAGNOSIS — H2512 Age-related nuclear cataract, left eye: Secondary | ICD-10-CM | POA: Diagnosis not present

## 2023-07-06 NOTE — Progress Notes (Signed)
 Office Visit Note  Patient: Richard [name]             Date of Birth: 1946/03/30           MRN: [id number]             PCP: Rollene Almarie LABOR, MD Referring: Rollene Almarie LABOR, * Visit Date: 07/20/2023 Occupation: @GUAROCC @  Subjective:  Follow-up   History of Present Illness: Richard [name] is a 77 y.o. male with psoriatic arthritis, and osteoarthritis returns today after his initial visit on June 01, 2023.  Patient was evaluated by Dr. Alm on June 07, 2023 she was diagnosed with psoriasis.  She was given a prescription for topical clobetasol  cream, clobetasol  drops for her scalp and DHS zinc shampoo for her scalp.  She also discussed possible use of Skyrizi or Taltz in the future as patient had significant weight loss and diarrhea on Otezla in the past.  Activities of Daily Living:  Patient reports morning stiffness for 2 hours.   Patient Denies nocturnal pain.  Difficulty dressing/grooming: Denies Difficulty climbing stairs: Denies Difficulty getting out of chair: Denies Difficulty using hands for taps, buttons, cutlery, and/or writing: Denies  Review of Systems  Constitutional:  Negative for fatigue.  HENT:  Negative for mouth sores and mouth dryness.   Eyes:  Negative for dryness.  Respiratory:  Negative for shortness of breath.   Cardiovascular:  Negative for chest pain and palpitations.  Gastrointestinal:  Negative for blood in stool, constipation and diarrhea.  Endocrine: Negative for increased urination.  Genitourinary:  Negative for involuntary urination.  Musculoskeletal:  Positive for joint pain, joint pain and morning stiffness. Negative for gait problem, joint swelling, myalgias, muscle weakness, muscle tenderness and myalgias.  Skin:  Positive for rash and sensitivity to sunlight. Negative for color change and hair loss.  Allergic/Immunologic: Positive for susceptible to infections.  Neurological:  Negative for dizziness and headaches.   Hematological:  Negative for swollen glands.  Psychiatric/Behavioral:  Negative for depressed mood and sleep disturbance. The patient is not nervous/anxious.     PMFS History:  Patient Active Problem List   Diagnosis Date Noted   Other long term (current) drug therapy 07/19/2023   Routine general medical examination at a health care facility 08/28/2022   Vertigo 08/28/2022   Venous insufficiency 08/28/2022   Mitral valvular prolapse 07/03/2022   Impaired fasting blood sugar 03/06/2022   Paroxysmal A-fib (HCC) 02/27/2022   Impingement syndrome of shoulder region 02/23/2022   Osteoarthritis of left glenohumeral joint 02/23/2022   Bilateral hearing loss 08/14/2020   Cough, persistent 08/14/2020   Deviated septum 08/14/2020   First degree AV block 08/24/2019   NSVT (nonsustained ventricular tachycardia) (HCC) 07/25/2019   Mixed hyperlipidemia 07/25/2019   Essential hypertension 07/03/2018   Impingement syndrome of right shoulder region 11/10/2017   Gastroesophageal reflux disease without esophagitis 03/10/2017   Rhinitis, chronic 03/10/2017   Chronic low back pain without sciatica 12/17/2015   Bilateral knee pain 12/17/2015   Psoriatic arthritis (HCC) 12/17/2015   Snoring 04/27/2014    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    Atrial fibrillation (HCC)    Back pain    Dyspnea    history of covid 09/2018 and with extertion    Dysrhythmia    GERD (gastroesophageal reflux disease)    Headache    Hypertension    Pain    PAIN AND OA RT KNEE  Pneumonia    YEARS AGO   Psoriasis    arthritis   Snoring 04/27/2014    Family History  Problem Relation Age of Onset   Diabetes Mother    Hypertension Mother    Heart disease Mother        has a stent    Stroke Father 28   Hypertension Father    Stroke Brother    Parkinson's disease Brother    Past Surgical History:  Procedure Laterality Date   CATARACT EXTRACTION  Bilateral    INGUINAL HERNIA REPAIR Left 01/29/2021   Procedure: LEFT INGUINAL HERNIA REPAIR WITH MESH;  Surgeon: Ebbie Cough, MD;  Location: [facility];  Service: General;  Laterality: Left;   KNEE ARTHROSCOPY Right 01/31/2014   Procedure: RIGHT ARTHROSCOPY KNEE WITH DEBRIDMENT CHONDROPLASTY;  Surgeon: Dempsey Melodi GAILS, MD;  Location: [facility];  Service: Orthopedics;  Laterality: Right;   LUMP REMOVED FROM LEFT BREAST Left 07/06/2006   STATES SURGERY  AT [facility] - LOCAL- NOT CANCER 5 OR MORE YRS AGO   MORTON'S NEUROMA REMOVED     TONSILLECTOMY     AS A CHILD   TRANSTHORACIC ECHOCARDIOGRAM  07/2020   Normal LV size and function.  GR 1 DD.  Mild RV enlargement.  Moderate LA & RA enlargement.  Mild MR.  MR likely related to mildly for MR/MVP.  Moderate TR, mildly elevated RAP.SABRA  Moderate ao valve thickening.  Mild AOV sclerosis but no stenosis.   Social History   Social History Narrative   Right handed, caffeine 2-3 cups daily, married, no kids, has baby bird.  WC back injury, so not working at this time.  BS in econmics.   Immunization History  Administered Date(s) Administered   PNEUMOCOCCAL CONJUGATE-20 03/06/2022   Pneumococcal Conjugate-13 07/30/2006   Pneumococcal Polysaccharide-23 07/30/2006, 06/14/2018   Tdap 07/28/2018   Zoster Recombinant(Shingrix) 08/16/2020, 12/06/2020   Zoster, Live 05/14/2012     Objective: Vital Signs: BP 100/61 (BP Location: Left Arm, Patient Position: Sitting, Cuff Size: Normal)   Pulse 77   Resp 14   Ht 5' 9.5 (1.765 m)   Wt 162 lb (73.5 kg)   BMI 23.58 kg/m    Physical Exam Vitals and nursing note reviewed.  Constitutional:      Appearance: He is well-developed.  HENT:     Head: Normocephalic and atraumatic.  Eyes:     Conjunctiva/sclera: Conjunctivae normal.     Pupils: Pupils are equal, round, and reactive to light.  Cardiovascular:     Rate and Rhythm: Normal rate and regular rhythm.     Heart sounds: Normal heart  sounds.  Pulmonary:     Effort: Pulmonary effort is normal.     Breath sounds: Normal breath sounds.  Abdominal:     General: Bowel sounds are normal.     Palpations: Abdomen is soft.  Musculoskeletal:     Cervical back: Normal range of motion and neck supple.  Skin:    General: Skin is warm and dry.     Capillary Refill: Capillary refill takes less than 2 seconds.     Comments: Psoriasis on the scalp and over both knees  Neurological:     Mental Status: He is alert and oriented to person, place, and time.  Psychiatric:        Behavior: Behavior normal.      Musculoskeletal Exam: Patient had limited flexion, extension and lateral rotation of the cervical spine.  He had limited mobility in the thoracic  and lumbar spine.  He had no tenderness over SI joints.  Shoulder abduction was about 90 degrees bilaterally.  He had limited internal rotation.  He is about 10 degree contractures in both elbows.  He had fusion of bilateral wrist joints with no synovitis.  Synovial thickening was noted.  Bilateral MCP thickening with no synovitis was noted.  He had contractures and most the PIP and DIP joints with no synovitis.  He had incomplete fist formation.  Hip joints were in good range of motion.  He had limited extension of both knee joints.  Mild compartment was noted on the palpation of the knee joints.  Thickening of bilateral ankle joints with no synovitis was noted.  No PIP and DIP synovitis was noted.  No plantar fasciitis or Achilles tendinitis was noted.  CDAI Exam: CDAI Score: -- Patient Global: --; Provider Global: -- Swollen: --; Tender: -- Joint Exam 07/20/2023   No joint exam has been documented for this visit   There is currently no information documented on the homunculus. Go to the Rheumatology activity and complete the homunculus joint exam.  Investigation: No additional findings.  Imaging: No results found.  Recent Labs: Lab Results  Component Value Date   WBC 5.4  06/01/2023   HGB 12.9 (L) 06/01/2023   PLT 168 06/01/2023   NA 142 06/01/2023   K 4.0 06/01/2023   CL 105 06/01/2023   CO2 30 06/01/2023   GLUCOSE 53 (L) 06/01/2023   BUN 16 06/01/2023   CREATININE 0.78 06/01/2023   BILITOT 1.1 06/01/2023   ALKPHOS 104 04/02/2023   AST 33 06/01/2023   ALT 35 06/01/2023   PROT 7.5 06/01/2023   ALBUMIN 4.2 04/02/2023   CALCIUM  9.8 06/01/2023   GFRAA 98 01/29/2020   June 01, 2023 ESR 9, RF negative, anti-CCP negative, uric acid 4.4, G6PD normal  Speciality Comments: Eye exam- 04/24 Dr. Coralee per pt.  Procedures:  No procedures performed Allergies: Azithromycin, Latex, and Prednisone   Assessment / Plan:     Visit Diagnoses: Psoriatic arthritis (HCC) - dxd in his 40s while in [place] with RA.  Later treated by Dr. Ishmael.  He has been on Plaquenil  over 30 years.  Patient has severe erosive end-stage psoriatic arthritis with multiple erosions on the x-rays.  All the x-rays were reviewed with the patient.  He denies any history of joint swelling.  It appears that his psoriatic arthritis burnt out.  I do not have any previous x-rays for comparison.  Radiographic progression is difficult to assess.  I discussed possible use of Skyrizi.  Patient is hesitant to go on aggressive medications since he does not have any inflammation.  I gave him a handout to review.  He wants to continue on hydroxychloroquine .  Increased risk of psoriasis with hydroxychloroquine  was discussed.  I also discussed the option of Otezla.  Patient states he has taken Otezla in the past but she has stopped it due to weight loss and nausea.  He is on Plaquenil  200 mg p.o. twice daily since age 77.  Increased risk of ocular toxicity with prolonged use of Plaquenil  was discussed.  Patient states he has been getting eye examination through his ophthalmologist on a regular basis.  A handout was given and consent was taken.  Patient was counseled on the purpose, proper use, and adverse  effects of hydroxychloroquine  including nausea/diarrhea, skin rash, headaches, and sun sensitivity.  Advised patient to wear sunscreen once starting hydroxychloroquine  to reduce risk of rash associated  with sun sensitivity.  Discussed importance of annual eye exams while on hydroxychloroquine  to monitor to ocular toxicity and discussed importance of frequent laboratory monitoring.  Provided patient with eye exam form for baseline ophthalmologic exam.  Provided patient with educational materials on hydroxychloroquine  and answered all questions.  Patient consented to hydroxychloroquine . Will upload consent in the media tab.    Reviewed risk for QTC prolongation when used in combination with other QTc prolonging agents (including but not limited to antiarrhythmics, macrolide antibiotics, flouroquinolone antibiotics, haloperidol, quetiapine, olanzapine, risperidone, droperidol, ziprasidone, amitriptyline, citalopram, ondansetron , migraine triptans, and methadone).    Psoriasis - Psoriasis patches were noted to her bilateral knee joints and the scalp at the last visit.  The diagnosis was confirmed by the dermatologist.  On topical agents  High risk medication use - He had been on Plaquenil  200 twice daily since age 89.  Patient reports last eye examination April 2024 by Dr. Coralee. consider switching to Skyrizi. - Plan: QuantiFERON-TB Gold Plus, Serum protein electrophoresis with reflex, Hepatitis B core antibody, IgM, Hepatitis B surface antigen, Hepatitis C antibody, IgG, IgA, IgM  Impingement syndrome of right shoulder region - Limited range of motion of bilateral shoulder joints was noted.  Osteoarthritis of left glenohumeral joint  Pain in both hands - Severe erosive arthritis involving intercarpal joints was noted.  Severe PIP and DIP arthritis was noted.  Findings are suggestive of psoriatic arthritis.  Contracture of joint of both elbows-bilateral elbow joint contractures were noted.  He had no  synovitis on the examination.  Primary osteoarthritis of both knees -he denies any discomfort in his knee joints.  He had limited extension without any swelling or effusion.  Warmth was noted on palpation of the knee joints.  Severe osteoarthritis of both knees and chondromalacia patella was noted on the x-rays.   Pain in both feet -he denies any discomfort in his feet.  Clinical and radiographic findings were suggestive of osteoarthritis and severe erosive inflammatory arthritis overlap.  The findings were consistent with psoriatic arthritis.  Pain, neck - Radiographs showed syndesmophytes and severe C4-C5 narrowing.  Large spur was noted at C4-5.  Patient denies any history of dysphagia.  Chronic midline low back pain without sciatica - Patient denied discomfort in his lumbar spine.  Other medical problems are listed as follows:  Paroxysmal A-fib (HCC)  Essential hypertension  First degree AV block  Mitral valvular prolapse  NSVT (nonsustained ventricular tachycardia) (HCC)  Mixed hyperlipidemia  Varicose ulcer of lower extremity, right (HCC)  Gastroesophageal reflux disease without esophagitis  Orders: Orders Placed This Encounter  Procedures   QuantiFERON-TB Gold Plus   Serum protein electrophoresis with reflex   Hepatitis B core antibody, IgM   Hepatitis B surface antigen   Hepatitis C antibody   IgG, IgA, IgM   No orders of the defined types were placed in this encounter.    Follow-Up Instructions: Return for Psoriatic arthritis.   Maya Nash, MD  Note - This record has been created using Animal nutritionist.  Chart creation errors have been sought, but may not always  have been located. Such creation errors do not reflect on  the standard of medical care.

## 2023-07-19 ENCOUNTER — Encounter: Payer: Self-pay | Admitting: Dermatology

## 2023-07-19 ENCOUNTER — Ambulatory Visit: Payer: Medicare Other | Admitting: Dermatology

## 2023-07-19 VITALS — BP 116/76 | HR 72

## 2023-07-19 DIAGNOSIS — L570 Actinic keratosis: Secondary | ICD-10-CM

## 2023-07-19 DIAGNOSIS — L409 Psoriasis, unspecified: Secondary | ICD-10-CM

## 2023-07-19 DIAGNOSIS — W908XXA Exposure to other nonionizing radiation, initial encounter: Secondary | ICD-10-CM | POA: Diagnosis not present

## 2023-07-19 DIAGNOSIS — Z79899 Other long term (current) drug therapy: Secondary | ICD-10-CM | POA: Insufficient documentation

## 2023-07-19 NOTE — Progress Notes (Signed)
   Follow-Up Visit   Subjective  Richard [name] is a 78 y.o. male who presents for the following: Psoriasis  Patient present today for follow up visit for Psoriasis. Patient was last evaluated on 06/07/23. At this visit pt was prescribed clobetasol  cream, Aquaphor ointment, DHS Zinc shampoo, and clobetasol  drops. Patient reports sxs are  improving . Patient denies medication changes.  The following portions of the chart were reviewed this encounter and updated as appropriate: medications, allergies, medical history  Review of Systems:  No other skin or systemic complaints except as noted in HPI or Assessment and Plan.  Objective  Well appearing patient in no apparent distress; mood and affect are within normal limits.  A full examination was performed including scalp, head, eyes, ears, nose, lips, neck, chest, axillae, abdomen, back, buttocks, bilateral upper extremities, bilateral lower extremities, hands, feet, fingers, toes, fingernails, and toenails. All findings within normal limits unless otherwise noted below.   Relevant exam findings are noted in the Assessment and Plan.        Left Zygomatic Area, Mid Frontal Scalp (2), Mid Parietal Scalp (3) Erythematous thin papules/macules with gritty scale.   Assessment & Plan   PSORIASIS Exam: Well-demarcated erythematous papules/plaques with silvery scale, guttate pink scaly papules. 5% BSA.  Improving  Patient reports joint pain  Psoriasis is a chronic non-curable, but treatable genetic/hereditary disease that may have other systemic features affecting other organ systems such as joints (Psoriatic Arthritis). It is associated with an increased risk of inflammatory bowel disease, heart disease, non-alcoholic fatty liver disease, and depression.  Treatments include light and laser treatments; topical medications; and systemic medications including oral and injectables.  Treatment Plan: -  Recommended continuing the clobetasol  to the  flared areas 2 times daily for 2 weeks then STOP - Recommended moisturizing daily with Aveeno to help prevent excessive dryness of skin  ACTINIC KERATOSIS Exam: Erythematous thin papules/macules with gritty scale at the scalp  Actinic keratoses are precancerous spots that appear secondary to cumulative UV radiation exposure/sun exposure over time. They are chronic with expected duration over 1 year. A portion of actinic keratoses will progress to squamous cell carcinoma of the skin. It is not possible to reliably predict which spots will progress to skin cancer and so treatment is recommended to prevent development of skin cancer.  Recommend daily broad spectrum sunscreen SPF 30+ to sun-exposed areas, reapply every 2 hours as needed.  Recommend staying in the shade or wearing long sleeves, sun glasses (UVA+UVB protection) and wide brim hats (4-inch brim around the entire circumference of the hat). Call for new or changing lesions. AK (ACTINIC KERATOSIS) (6) Left Zygomatic Area, Mid Frontal Scalp (2), Mid Parietal Scalp (3) Destruction of lesion - Left Zygomatic Area, Mid Frontal Scalp (2), Mid Parietal Scalp (3) Complexity: simple   Destruction method: cryotherapy   Informed consent: discussed and consent obtained   Timeout:  patient name, date of birth, surgical site, and procedure verified Lesion destroyed using liquid nitrogen: Yes   Cryotherapy cycles:  6 Post-procedure details: wound care instructions given    Return in about 6 months (around 01/16/2024).  Documentation: I have reviewed the above documentation for accuracy and completeness, and I agree with the above.  I, Jetta Ager, am acting as scribe for Delon Lenis, DO.  Delon Lenis, DO

## 2023-07-19 NOTE — Patient Instructions (Addendum)
 Hello Richard [name],  Thank you for visiting my office today. Your dedication to managing your dermatological needs and improving your health is greatly appreciated. Below is a summary of our discussion and the forward-moving treatment plan for you Psoriasis:  Medications and Usage:   Clobetasol  Cream: Continue application for 2 weeks on, followed by 2 weeks off. Repeat this cycle for a couple of cycles.   Aquaphor: Use during the off weeks from Clobetasol  to maintain skin hydration.   Scalp Treatment: Regularly use DHS Zinc Shampoo and apply Clobetasol  Liquid occasionally on thick areas.  Procedures Performed:   Actinic keratoses on your scalp have been treated with liquid nitrogen today to prevent progression to squamous cell carcinoma.  Lifestyle Recommendations:   Limit sun exposure to avoid medication sensitivity complications.   For cream application on hard-to-reach areas like the back, seek assistance.  Follow-Up Care:   Aftercare: Use Aquaphor on treated areas, available at local stores like [employer] or [employer].   Moisturization: Consider Aveeno products with colloidal oatmeal for natural skin hydration, including oatmeal bath packets and moisturizers, available over the counter.  Refills and Communications:   DHS Zinc Shampoo is available over the counter. For prescription refills or other queries, please communicate via MyChart.  Please do not hesitate to reach out if you have any further questions or require additional guidance. I look forward to our next appointment to assess progress and adjust the treatment plan as necessary.  Warm regards,  Dr. Delon Lenis, Dermatologist   Important Information   Due to recent changes in healthcare laws, you may see results of your pathology and/or laboratory studies on MyChart before the doctors have had a chance to review them. We understand that in some cases there may be results that are confusing or concerning to you. Please understand  that not all results are received at the same time and often the doctors may need to interpret multiple results in order to provide you with the best plan of care or course of treatment. Therefore, we ask that you please give us  2 business days to thoroughly review all your results before contacting the office for clarification. Should we see a critical lab result, you will be contacted sooner.     If You Need Anything After Your Visit   If you have any questions or concerns for your doctor, please call our main line at [phone]. If no one answers, please leave a voicemail as directed and we will return your call as soon as possible. Messages left after 4 pm will be answered the following business day.    You may also send us  a message via MyChart. We typically respond to MyChart messages within 1-2 business days.  For prescription refills, please ask your pharmacy to contact our office. Our fax number is [phone].  If you have an urgent issue when the clinic is closed that cannot wait until the next business day, you can page your doctor at the number below.     Please note that while we do our best to be available for urgent issues outside of office hours, we are not available 24/7.    If you have an urgent issue and are unable to reach us , you may choose to seek medical care at your doctor's office, retail clinic, urgent care center, or emergency room.   If you have a medical emergency, please immediately call 911 or go to the emergency department. In the event of inclement weather, please call our main  line at [phone] for an update on the status of any delays or closures.  Dermatology Medication Tips: Please keep the boxes that topical medications come in in order to help keep track of the instructions about where and how to use these. Pharmacies typically print the medication instructions only on the boxes and not directly on the medication tubes.   If your medication is too  expensive, please contact our office at [phone] or send us  a message through MyChart.    We are unable to tell what your co-pay for medications will be in advance as this is different depending on your insurance coverage. However, we may be able to find a substitute medication at lower cost or fill out paperwork to get insurance to cover a needed medication.    If a prior authorization is required to get your medication covered by your insurance company, please allow us  1-2 business days to complete this process.   Drug prices often vary depending on where the prescription is filled and some pharmacies may offer cheaper prices.   The website www.goodrx.com contains coupons for medications through different pharmacies. The prices here do not account for what the cost may be with help from insurance (it may be cheaper with your insurance), but the website can give you the price if you did not use any insurance.  - You can print the associated coupon and take it with your prescription to the pharmacy.  - You may also stop by our office during regular business hours and pick up a GoodRx coupon card.  - If you need your prescription sent electronically to a different pharmacy, notify our office through [facility] or by phone at [phone]

## 2023-07-20 ENCOUNTER — Ambulatory Visit: Payer: Medicare Other | Attending: Rheumatology | Admitting: Rheumatology

## 2023-07-20 ENCOUNTER — Encounter: Payer: Self-pay | Admitting: Rheumatology

## 2023-07-20 VITALS — BP 100/61 | HR 77 | Resp 14 | Ht 69.5 in | Wt 162.0 lb

## 2023-07-20 DIAGNOSIS — M545 Low back pain, unspecified: Secondary | ICD-10-CM

## 2023-07-20 DIAGNOSIS — M24521 Contracture, right elbow: Secondary | ICD-10-CM

## 2023-07-20 DIAGNOSIS — I480 Paroxysmal atrial fibrillation: Secondary | ICD-10-CM

## 2023-07-20 DIAGNOSIS — M79641 Pain in right hand: Secondary | ICD-10-CM | POA: Diagnosis not present

## 2023-07-20 DIAGNOSIS — I440 Atrioventricular block, first degree: Secondary | ICD-10-CM

## 2023-07-20 DIAGNOSIS — K219 Gastro-esophageal reflux disease without esophagitis: Secondary | ICD-10-CM

## 2023-07-20 DIAGNOSIS — E782 Mixed hyperlipidemia: Secondary | ICD-10-CM

## 2023-07-20 DIAGNOSIS — Z79899 Other long term (current) drug therapy: Secondary | ICD-10-CM | POA: Diagnosis not present

## 2023-07-20 DIAGNOSIS — M170 Bilateral primary osteoarthritis of knee: Secondary | ICD-10-CM

## 2023-07-20 DIAGNOSIS — I10 Essential (primary) hypertension: Secondary | ICD-10-CM

## 2023-07-20 DIAGNOSIS — M7541 Impingement syndrome of right shoulder: Secondary | ICD-10-CM | POA: Diagnosis not present

## 2023-07-20 DIAGNOSIS — L409 Psoriasis, unspecified: Secondary | ICD-10-CM | POA: Diagnosis not present

## 2023-07-20 DIAGNOSIS — I83019 Varicose veins of right lower extremity with ulcer of unspecified site: Secondary | ICD-10-CM

## 2023-07-20 DIAGNOSIS — M79642 Pain in left hand: Secondary | ICD-10-CM

## 2023-07-20 DIAGNOSIS — M542 Cervicalgia: Secondary | ICD-10-CM | POA: Diagnosis not present

## 2023-07-20 DIAGNOSIS — M19012 Primary osteoarthritis, left shoulder: Secondary | ICD-10-CM | POA: Diagnosis not present

## 2023-07-20 DIAGNOSIS — L4050 Arthropathic psoriasis, unspecified: Secondary | ICD-10-CM | POA: Diagnosis not present

## 2023-07-20 DIAGNOSIS — M79671 Pain in right foot: Secondary | ICD-10-CM

## 2023-07-20 DIAGNOSIS — G8929 Other chronic pain: Secondary | ICD-10-CM

## 2023-07-20 DIAGNOSIS — L97919 Non-pressure chronic ulcer of unspecified part of right lower leg with unspecified severity: Secondary | ICD-10-CM

## 2023-07-20 DIAGNOSIS — M79672 Pain in left foot: Secondary | ICD-10-CM

## 2023-07-20 DIAGNOSIS — M24522 Contracture, left elbow: Secondary | ICD-10-CM

## 2023-07-20 DIAGNOSIS — I341 Nonrheumatic mitral (valve) prolapse: Secondary | ICD-10-CM

## 2023-07-20 DIAGNOSIS — I4729 Other ventricular tachycardia: Secondary | ICD-10-CM

## 2023-07-20 NOTE — Patient Instructions (Addendum)
 Risankizumab Injection What is this medication? RISANKIZUMAB (RIS an KIZ ue mab) treats autoimmune conditions, such as psoriasis, arthritis, and Crohn's disease. It works by slowing down an overactive immune system. It is a monoclonal antibody. This medicine may be used for other purposes; ask your health care provider or pharmacist if you have questions. COMMON BRAND NAME(S): Skyrizi What should I tell my care team before I take this medication? They need to know if you have any of these conditions: Hepatic disease Immune system problems Infection, such as tuberculosis (TB), bacterial, fungal or viral infections Recent or upcoming vaccine An unusual or allergic reaction to risankizumab, other medications, foods, dyes, or preservatives Pregnant or trying to get pregnant Breast-feeding How should I use this medication? This medication is injected into a vein or under the skin. It is given by your care team in a hospital or clinic setting. It may also be given at home. If you get this medication at home, you will be taught how to prepare and give it. Use exactly as directed. Take it as directed on the prescription label. Keep taking it unless your care team tells you to stop. If you use a pen, be sure to take off the outer needle cover before using the dose. It is important that you put your used needles and syringes in a special sharps container. Do not put them in a trash can. If you do not have a sharps container, call your pharmacist or care team to get one. A special MedGuide will be given to you by the pharmacist with each prescription and refill. Be sure to read this information carefully each time. This medication comes with INSTRUCTIONS FOR USE. Ask your pharmacist for directions on how to use this medication. Read the information carefully. Talk to your pharmacist or care team if you have questions. Talk to your care team about the use of this medication in children. Special care may be  needed. Overdosage: If you think you have taken too much of this medicine contact a poison control center or emergency room at once. NOTE: This medicine is only for you. Do not share this medicine with others. What if I miss a dose? It is important not to miss any doses. Talk to your care team about what to do if you miss a dose. What may interact with this medication? Do not take this medication with any of the following: Live vaccines This list may not describe all possible interactions. Give your health care provider a list of all the medicines, herbs, non-prescription drugs, or dietary supplements you use. Also tell them if you smoke, drink alcohol, or use illegal drugs. Some items may interact with your medicine. What should I watch for while using this medication? Visit your care team for regular checks on your progress. Tell your care team if your symptoms do not start to get better or if they get worse. You will be tested for tuberculosis (TB) before you start this medication. If your care team prescribes any medication for TB, you should start taking the TB medication before starting this medication. Make sure to finish the full course of TB medication. This medication may increase your risk of getting an infection. Call your care team for advice if you get a fever, chills, sore throat, or other symptoms of a cold or flu. Do not treat yourself. Try to avoid being around people who are sick. This medication can decrease the response to a vaccine. If you need to get  vaccinated, tell your care team if you have received this medication. Extra booster doses may be needed. Talk to your care team to see if a different vaccination schedule is needed. What side effects may I notice from receiving this medication? Side effects that you should report to your care team as soon as possible: Allergic reactions--skin rash, itching, hives, swelling of the face, lips, tongue, or throat Infection--fever,  chills, cough, sore throat, wounds that don't heal, pain or trouble when passing urine, general feeling of discomfort or being unwell Liver injury--right upper belly pain, loss of appetite, nausea, light-colored stool, dark yellow or brown urine, yellowing skin or eyes, unusual weakness or fatigue Side effects that usually do not require medical attention (report to your care team if they continue or are bothersome): Fatigue Headache Pain, redness, or irritation at injection site Runny or stuffy nose Sore throat This list may not describe all possible side effects. Call your doctor for medical advice about side effects. You may report side effects to FDA at 1-800-FDA-1088. Where should I keep my medication? Keep out of the reach of children and pets. Store in a refrigerator. Do not freeze. Protect from light. Keep it in the original carton until you are ready to take it. See product for storage information. Each product may have different instructions. Remove the dose from the carton about 30 to 45 minutes before it is time for you to take it. Get rid of any unused medication after the expiration date. To get rid of medications that are no longer needed or have expired: Take the medication to a medication take-back program. Check with your pharmacy or law enforcement to find a location. If you cannot return the medication, ask your pharmacist or care team how to get rid of this medication safely. NOTE: This sheet is a summary. It may not cover all possible information. If you have questions about this medicine, talk to your doctor, pharmacist, or health care provider.  2024 Elsevier/Gold Standard (2021-09-10 00:00:00)  Hydroxychloroquine  Tablets What is this medication? HYDROXYCHLOROQUINE  (hye drox ee KLOR oh kwin) treats autoimmune conditions, such as rheumatoid arthritis and lupus. It works by slowing down an overactive immune system. It may also be used to prevent and treat malaria. It works  by killing the parasite that causes malaria. It belongs to a group of medications called DMARDs. This medicine may be used for other purposes; ask your health care provider or pharmacist if you have questions. COMMON BRAND NAME(S): Plaquenil , Quineprox, SOVUNA  What should I tell my care team before I take this medication? They need to know if you have any of these conditions: Diabetes Eye disease, vision problems Frequently drink alcohol G6PD deficiency Heart disease Irregular heartbeat or rhythm Kidney disease Liver disease Porphyria Psoriasis An unusual or allergic reaction to hydroxychloroquine , other medications, foods, dyes, or preservatives Pregnant or trying to get pregnant Breastfeeding How should I use this medication? Take this medication by mouth with water. Take it as directed on the prescription label. Do not cut, crush, or chew this medication. Swallow the tablets whole. Take it with food. Do not take it more than directed. Take all of this medication unless your care team tells you to stop it early. Keep taking it even if you think you are better. Take products with antacids in them at a different time of day than this medication. Take this medication 4 hours before or 4 hours after antacids. Talk to your care team if you have questions. Talk to  your care team about the use of this medication in children. While this medication may be prescribed for selected conditions, precautions do apply. Overdosage: If you think you have taken too much of this medicine contact a poison control center or emergency room at once. NOTE: This medicine is only for you. Do not share this medicine with others. What if I miss a dose? If you miss a dose, take it as soon as you can. If it is almost time for your next dose, take only that dose. Do not take double or extra doses. What may interact with this medication? Do not take this medication with any of the  following: Cisapride Dronedarone Pimozide Thioridazine This medication may also interact with the following: Ampicillin Antacids Cimetidine Cyclosporine Digoxin Kaolin Medications for diabetes, such as insulin, glipizide, glyburide Medications for seizures, such as carbamazepine, phenobarbital, phenytoin Mefloquine Methotrexate Other medications that cause heart rhythm changes Praziquantel This list may not describe all possible interactions. Give your health care provider a list of all the medicines, herbs, non-prescription drugs, or dietary supplements you use. Also tell them if you smoke, drink alcohol, or use illegal drugs. Some items may interact with your medicine. What should I watch for while using this medication? Visit your care team for regular checks on your progress. Tell your care team if your symptoms do not start to get better or if they get worse. You may need blood work done while you are taking this medication. If you take other medications that can affect heart rhythm, you may need more testing. Talk to your care team if you have questions. Your vision may be tested before and during use of this medication. Tell your care team right away if you have any change in your eyesight. This medication may cause serious skin reactions. They can happen weeks to months after starting the medication. Contact your care team right away if you notice fevers or flu-like symptoms with a rash. The rash may be red or purple and then turn into blisters or peeling of the skin. Or, you might notice a red rash with swelling of the face, lips or lymph nodes in your neck or under your arms. If you or your family notice any changes in your behavior, such as new or worsening depression, thoughts of harming yourself, anxiety, or other unusual or disturbing thoughts, or memory loss, call your care team right away. What side effects may I notice from receiving this medication? Side effects that you  should report to your care team as soon as possible: Allergic reactions--skin rash, itching, hives, swelling of the face, lips, tongue, or throat Aplastic anemia--unusual weakness or fatigue, dizziness, headache, trouble breathing, increased bleeding or bruising Change in vision Heart rhythm changes--fast or irregular heartbeat, dizziness, feeling faint or lightheaded, chest pain, trouble breathing Infection--fever, chills, cough, or sore throat Low blood sugar (hypoglycemia)--tremors or shaking, anxiety, sweating, cold or clammy skin, confusion, dizziness, rapid heartbeat Muscle injury--unusual weakness or fatigue, muscle pain, dark yellow or brown urine, decrease in amount of urine Pain, tingling, or numbness in the hands or feet Rash, fever, and swollen lymph nodes Redness, blistering, peeling, or loosening of the skin, including inside the mouth Thoughts of suicide or self-harm, worsening mood, or feelings of depression Unusual bruising or bleeding Side effects that usually do not require medical attention (report to your care team if they continue or are bothersome): Diarrhea Headache Nausea Stomach pain Vomiting This list may not describe all possible side effects. Call your  doctor for medical advice about side effects. You may report side effects to FDA at 1-800-FDA-1088. Where should I keep my medication? Keep out of the reach of children and pets. Store at room temperature up to 30 degrees C (86 degrees F). Protect from light. Get rid of any unused medication after the expiration date. To get rid of medications that are no longer needed or have expired: Take the medication to a medication take-back program. Check with your pharmacy or law enforcement to find a location. If you cannot return the medication, check the label or package insert to see if the medication should be thrown out in the garbage or flushed down the toilet. If you are not sure, ask your care team. If it is safe  to put it in the trash, empty the medication out of the container. Mix the medication with cat litter, dirt, coffee grounds, or other unwanted substance. Seal the mixture in a bag or container. Put it in the trash. NOTE: This sheet is a summary. It may not cover all possible information. If you have questions about this medicine, talk to your doctor, pharmacist, or health care provider.  2024 Elsevier/Gold Standard (2021-12-29 00:00:00) Vaccines You are taking a medication(s) that can suppress your immune system.  The following immunizations are recommended: Flu annually Covid-19  Td/Tdap (tetanus, diphtheria, pertussis) every 10 years Pneumonia (Prevnar 15 then Pneumovax 23 at least 1 year apart.  Alternatively, can take Prevnar 20 without needing additional dose) Shingrix: 2 doses from 4 weeks to 6 months apart  Please check with your PCP to make sure you are up to date.

## 2023-07-28 ENCOUNTER — Encounter: Payer: Medicare Other | Admitting: Rheumatology

## 2023-07-29 DIAGNOSIS — I872 Venous insufficiency (chronic) (peripheral): Secondary | ICD-10-CM | POA: Diagnosis not present

## 2023-07-29 DIAGNOSIS — I87393 Chronic venous hypertension (idiopathic) with other complications of bilateral lower extremity: Secondary | ICD-10-CM | POA: Diagnosis not present

## 2023-07-29 DIAGNOSIS — I8001 Phlebitis and thrombophlebitis of superficial vessels of right lower extremity: Secondary | ICD-10-CM | POA: Diagnosis not present

## 2023-08-02 DIAGNOSIS — H35372 Puckering of macula, left eye: Secondary | ICD-10-CM | POA: Diagnosis not present

## 2023-08-04 ENCOUNTER — Encounter: Payer: Self-pay | Admitting: Internal Medicine

## 2023-08-11 DIAGNOSIS — M170 Bilateral primary osteoarthritis of knee: Secondary | ICD-10-CM | POA: Diagnosis not present

## 2023-08-23 ENCOUNTER — Ambulatory Visit: Payer: Medicare Other | Admitting: Rheumatology

## 2023-08-24 ENCOUNTER — Other Ambulatory Visit: Payer: Self-pay | Admitting: Internal Medicine

## 2023-08-24 DIAGNOSIS — I4891 Unspecified atrial fibrillation: Secondary | ICD-10-CM

## 2023-08-25 ENCOUNTER — Other Ambulatory Visit: Payer: Self-pay | Admitting: *Deleted

## 2023-08-25 ENCOUNTER — Encounter: Payer: Self-pay | Admitting: *Deleted

## 2023-08-25 MED ORDER — HYDROXYCHLOROQUINE SULFATE 200 MG PO TABS: 200.0000 mg | ORAL_TABLET | Freq: Two times a day (BID) | ORAL | 0 refills | Status: DC

## 2023-08-25 NOTE — Telephone Encounter (Signed)
Refill request received via fax from [facility] for Plaquenil.  Eye exam: not on file.Patient reports last eye examination April 2024  Patient to call eye doctor and have them fax results.    Labs: 06/01/2023 Hemoglobin is mildly decreased.  CMP normal, sed rate normal, RF negative, uric acid in the desirable range.   Next Visit: 12/24/2023  Last Visit: 07/20/2023  [id number] arthritis   Current Dose per office note 07/20/2023: Plaquenil 200 twice daily since age 78   Okay to refill Plaquenil?

## 2023-08-27 ENCOUNTER — Encounter: Payer: Self-pay | Admitting: Podiatry

## 2023-08-27 ENCOUNTER — Ambulatory Visit: Payer: Medicare Other | Admitting: Podiatry

## 2023-08-27 DIAGNOSIS — M79675 Pain in left toe(s): Secondary | ICD-10-CM

## 2023-08-27 DIAGNOSIS — Z7901 Long term (current) use of anticoagulants: Secondary | ICD-10-CM | POA: Diagnosis not present

## 2023-08-27 DIAGNOSIS — B351 Tinea unguium: Secondary | ICD-10-CM

## 2023-08-27 DIAGNOSIS — M79674 Pain in right toe(s): Secondary | ICD-10-CM

## 2023-08-30 NOTE — Progress Notes (Signed)
 Subjective: Chief Complaint  Patient presents with   RFC    RM#12 RFC patient states second toe on right foot bothering him.    78 y.o. returns the office today for painful, elongated, thickened toenails which he cannot trim himself. Denies any redness or drainage around the nails.  No open lesions.  No other concerns today.    PCP: Myrlene Broker, MD Last seen 06/08/2023  He is on Eliquis for A-fib  Objective: AAO 3, NAD DP/PT pulses palpable, CRT less than 3 seconds Venous insuffiencey noted Nails hypertrophic, dystrophic, elongated, brittle, discolored 10. There is tenderness overlying the nails 1-5 bilaterally. There is no surrounding erythema or drainage along the nail sites.  Hammertoes present, rigid No pain with calf compression, swelling, warmth, erythema.  Assessment: Patient presents with symptomatic onychomycosis, on Eliquis  Plan: -Treatment options including alternatives, risks, complications were discussed -Nails sharply debrided 10 without complication/bleeding. -Discussed daily foot inspection. If there are any changes, to call the office immediately.  -Compression/elevation  -Follow-up as scheduled or sooner if any problems are to arise. In the meantime, encouraged to call the office with any questions, concerns, changes symptoms.  Return in about 3 months (around 11/24/2023).  Vivi Barrack DPM

## 2023-09-01 ENCOUNTER — Ambulatory Visit (INDEPENDENT_AMBULATORY_CARE_PROVIDER_SITE_OTHER): Payer: Medicare Other | Admitting: Internal Medicine

## 2023-09-01 ENCOUNTER — Encounter: Payer: Self-pay | Admitting: Internal Medicine

## 2023-09-01 ENCOUNTER — Ambulatory Visit (INDEPENDENT_AMBULATORY_CARE_PROVIDER_SITE_OTHER): Payer: Medicare Other

## 2023-09-01 VITALS — BP 118/64 | HR 81 | Temp 98.0°F

## 2023-09-01 DIAGNOSIS — R1032 Left lower quadrant pain: Secondary | ICD-10-CM

## 2023-09-01 DIAGNOSIS — I878 Other specified disorders of veins: Secondary | ICD-10-CM | POA: Diagnosis not present

## 2023-09-01 DIAGNOSIS — Z0000 Encounter for general adult medical examination without abnormal findings: Secondary | ICD-10-CM

## 2023-09-01 DIAGNOSIS — K219 Gastro-esophageal reflux disease without esophagitis: Secondary | ICD-10-CM | POA: Diagnosis not present

## 2023-09-01 DIAGNOSIS — E782 Mixed hyperlipidemia: Secondary | ICD-10-CM | POA: Diagnosis not present

## 2023-09-01 DIAGNOSIS — R7301 Impaired fasting glucose: Secondary | ICD-10-CM

## 2023-09-01 DIAGNOSIS — I10 Essential (primary) hypertension: Secondary | ICD-10-CM

## 2023-09-01 DIAGNOSIS — M79652 Pain in left thigh: Secondary | ICD-10-CM | POA: Insufficient documentation

## 2023-09-01 DIAGNOSIS — M1612 Unilateral primary osteoarthritis, left hip: Secondary | ICD-10-CM | POA: Diagnosis not present

## 2023-09-01 LAB — COMPREHENSIVE METABOLIC PANEL
ALT: 36 U/L (ref 0–53)
AST: 31 U/L (ref 0–37)
Albumin: 4.2 g/dL (ref 3.5–5.2)
Alkaline Phosphatase: 93 U/L (ref 39–117)
BUN: 19 mg/dL (ref 6–23)
CO2: 29 meq/L (ref 19–32)
Calcium: 9.8 mg/dL (ref 8.4–10.5)
Chloride: 104 meq/L (ref 96–112)
Creatinine, Ser: 0.83 mg/dL (ref 0.40–1.50)
GFR: 85.64 mL/min (ref >60.00)
Glucose, Bld: 93 mg/dL (ref 70–99)
Potassium: 4.3 meq/L (ref 3.5–5.1)
Sodium: 140 meq/L (ref 135–145)
Total Bilirubin: 1 mg/dL (ref 0.2–1.2)
Total Protein: 7.6 g/dL (ref 6.0–8.3)

## 2023-09-01 LAB — LIPID PANEL
Cholesterol: 138 mg/dL (ref 0–200)
HDL: 75.2 mg/dL (ref >39.00)
LDL Cholesterol: 56 mg/dL (ref 0–99)
NonHDL: 62.55
Total CHOL/HDL Ratio: 2
Triglycerides: 34 mg/dL (ref 0.0–149.0)
VLDL: 6.8 mg/dL (ref 0.0–40.0)

## 2023-09-01 LAB — CBC
HCT: 42 % (ref 39.0–52.0)
Hemoglobin: 13.6 g/dL (ref 13.0–17.0)
MCHC: 32.5 g/dL (ref 30.0–36.0)
MCV: 87.6 fL (ref 78.0–100.0)
Platelets: 170 10*3/uL (ref 150.0–400.0)
RBC: 4.79 Mil/uL (ref 4.22–5.81)
RDW: 15.8 % — ABNORMAL HIGH (ref 11.5–15.5)
WBC: 6.5 10*3/uL (ref 4.0–10.5)

## 2023-09-01 NOTE — Assessment & Plan Note (Signed)
 Checking x-ray left hip and pelvis. Could be muscle or related to hernia mesh in that region.

## 2023-09-01 NOTE — Assessment & Plan Note (Signed)
 Taking cytotec and controlled and pepcid.

## 2023-09-01 NOTE — Assessment & Plan Note (Signed)
 Continue to monitor glucose and use Hga1c as needed if elevated.

## 2023-09-01 NOTE — Assessment & Plan Note (Signed)
 Flu shot up to date. Pneumonia complete. Shingrix complete. Tetanus up to date. Cologuard ordered recently. Counseled about sun safety and mole surveillance. Counseled about the dangers of distracted driving. Given 10 year screening recommendations.

## 2023-09-01 NOTE — Assessment & Plan Note (Signed)
 BP at goal checking CBC and CMP. Adjust as needed his lisinopril/hydrochlorothiazide 10/12.5.

## 2023-09-01 NOTE — Assessment & Plan Note (Signed)
Checking lipid panel and adjust crestor 40 mg daily as needed.  

## 2023-09-01 NOTE — Progress Notes (Signed)
 Subjective:   Patient ID: Richard [name], male    DOB: 04/22/1946, 78 y.o.   MRN: [id number]  HPI Here for medicare wellness and physical, no new complaints. Please see A/P for status and treatment of chronic medical problems.   Diet: heart healthy Physical activity: active Depression/mood screen: negative Hearing: intact to whispered voice Visual acuity: grossly normal, recent cataract surgery, performs annual eye exam  ADLs: capable Fall risk: none Home safety: good Cognitive evaluation: intact to orientation, naming, recall and repetition EOL planning: adv directives discussed  Flowsheet Row Office Visit from 09/01/2023 in [facility] [place]  PHQ-2 Total Score 0       Flowsheet Row Office Visit from 08/28/2022 in [facility]  PHQ-9 Total Score 0         08/28/2022   11:07 AM 08/28/2022   11:10 AM 10/16/2022    3:42 PM 06/08/2023   11:03 AM 09/01/2023   10:54 AM  Fall Risk  Falls in the past year? 0 0 0 0 0  Was there an injury with Fall? 0 0 0 0 0  Fall Risk Category Calculator 0 0 0 0 0  Patient at Risk for Falls Due to     No Fall Risks  Fall risk Follow up Falls evaluation completed Falls evaluation completed Falls evaluation completed Falls evaluation completed Falls evaluation completed    I have personally reviewed and have noted 1. The patient's medical and social history - reviewed today no changes 2. Their use of alcohol, tobacco or illicit drugs 3. Their current medications and supplements 4. The patient's functional ability including ADL's, fall risks, home safety risks and hearing or visual impairment. 5. Diet and physical activities 6. Evidence for depression or mood disorders 7. Care team reviewed and updated 8.  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    Atrial fibrillation (HCC)    Back pain    Dyspnea    history of covid 09/2018 and with extertion    Dysrhythmia    GERD (gastroesophageal reflux disease)    Headache    Hypertension    Pain    PAIN AND OA RT KNEE   Pneumonia    YEARS AGO   Psoriasis    arthritis   Snoring 04/27/2014   Past Surgical History:  Procedure Laterality Date   CATARACT EXTRACTION Bilateral    INGUINAL HERNIA REPAIR Left 01/29/2021   Procedure: LEFT INGUINAL HERNIA REPAIR WITH MESH;  Surgeon: Emelia Loron, MD;  Location: [facility];  Service: General;  Laterality: Left;   KNEE ARTHROSCOPY Right 01/31/2014   Procedure: RIGHT ARTHROSCOPY KNEE WITH DEBRIDMENT CHONDROPLASTY;  Surgeon: Loanne Drilling, MD;  Location: [facility];  Service: Orthopedics;  Laterality: Right;   LUMP REMOVED FROM LEFT BREAST Left 07/06/2006   STATES SURGERY  AT [facility] - LOCAL- NOT CANCER 5 OR MORE YRS AGO   MORTON'S NEUROMA REMOVED     TONSILLECTOMY     AS A CHILD   TRANSTHORACIC ECHOCARDIOGRAM  07/2020   Normal LV size and function.  GR 1 DD.  Mild RV enlargement.  Moderate LA & RA  enlargement.  Mild MR.  MR likely related to mildly for MR/MVP.  Moderate TR, mildly elevated RAP.Marland Kitchen  Moderate ao valve thickening.  Mild AOV sclerosis but no stenosis.   Family History  Problem Relation Age of Onset   Diabetes Mother    Hypertension Mother    Heart disease Mother        has a stent    Stroke Father 37   Hypertension Father    Stroke Brother    Parkinson's disease Brother    Review of Systems  Constitutional: Negative.   HENT: Negative.    Eyes: Negative.   Respiratory:  Negative for cough, chest tightness and shortness of breath.   Cardiovascular:  Negative for chest pain, palpitations and leg swelling.  Gastrointestinal:   Negative for abdominal distention, abdominal pain, constipation, diarrhea, nausea and vomiting.  Musculoskeletal: Negative.   Skin: Negative.   Neurological: Negative.   Psychiatric/Behavioral: Negative.      Objective:  Physical Exam Constitutional:      Appearance: He is well-developed.  HENT:     Head: Normocephalic and atraumatic.  Cardiovascular:     Rate and Rhythm: Normal rate and regular rhythm.  Pulmonary:     Effort: Pulmonary effort is normal. No respiratory distress.     Breath sounds: Normal breath sounds. No wheezing or rales.  Abdominal:     General: Bowel sounds are normal. There is no distension.     Palpations: Abdomen is soft.     Tenderness: There is no abdominal tenderness. There is no rebound.  Musculoskeletal:     Cervical back: Normal range of motion.  Skin:    General: Skin is warm and dry.  Neurological:     Mental Status: He is alert and oriented to person, place, and time.     Coordination: Coordination normal.     Vitals:   09/01/23 1054  BP: 118/64  Pulse: 81  Temp: 98 F (36.7 C)  TempSrc: Temporal  SpO2: 96%    Assessment & Plan:

## 2023-09-02 ENCOUNTER — Encounter: Payer: Self-pay | Admitting: Internal Medicine

## 2023-09-08 ENCOUNTER — Encounter: Payer: Self-pay | Admitting: Internal Medicine

## 2023-09-08 ENCOUNTER — Ambulatory Visit: Payer: Medicare Other | Admitting: Internal Medicine

## 2023-09-08 VITALS — BP 112/68 | HR 75 | Temp 98.3°F | Ht 69.5 in | Wt 157.0 lb

## 2023-09-08 DIAGNOSIS — I10 Essential (primary) hypertension: Secondary | ICD-10-CM

## 2023-09-08 DIAGNOSIS — R1032 Left lower quadrant pain: Secondary | ICD-10-CM

## 2023-09-08 DIAGNOSIS — I83892 Varicose veins of left lower extremities with other complications: Secondary | ICD-10-CM | POA: Diagnosis not present

## 2023-09-08 DIAGNOSIS — R600 Localized edema: Secondary | ICD-10-CM | POA: Diagnosis not present

## 2023-09-08 DIAGNOSIS — I83812 Varicose veins of left lower extremities with pain: Secondary | ICD-10-CM | POA: Diagnosis not present

## 2023-09-08 NOTE — Assessment & Plan Note (Signed)
 Counseled and reviewed x-ray with him no severe arthritis hips final read not done yet.

## 2023-09-08 NOTE — Assessment & Plan Note (Signed)
 Reviewed CBC, CMP, lipid panel with him and overall stable.

## 2023-09-08 NOTE — Progress Notes (Signed)
   Subjective:   Patient ID: Richard [name], male    DOB: 05-05-1946, 78 y.o.   MRN: [id number]  HPI The patient is a 78 YO man coming in for discussion about labs in the setting of his health conditions.   Review of Systems  Constitutional: Negative.   HENT: Negative.    Eyes: Negative.   Respiratory:  Negative for cough, chest tightness and shortness of breath.   Cardiovascular:  Negative for chest pain, palpitations and leg swelling.  Gastrointestinal:  Negative for abdominal distention, abdominal pain, constipation, diarrhea, nausea and vomiting.  Musculoskeletal: Negative.   Skin: Negative.   Neurological: Negative.   Psychiatric/Behavioral: Negative.      Objective:  Physical Exam Constitutional:      Appearance: Normal appearance.  HENT:     Head: Normocephalic.  Cardiovascular:     Rate and Rhythm: Normal rate and regular rhythm.  Pulmonary:     Effort: Pulmonary effort is normal.  Musculoskeletal:        General: Normal range of motion.  Skin:    General: Skin is warm and dry.  Neurological:     General: No focal deficit present.     Mental Status: He is alert and oriented to person, place, and time.     Vitals:   09/08/23 1015  BP: 112/68  Pulse: 75  Temp: 98.3 F (36.8 C)  TempSrc: Oral  SpO2: 98%  Weight: 157 lb (71.2 kg)  Height: 5' 9.5" (1.765 m)    Assessment & Plan:  Visit time 15 minutes in face to face communication with patient and coordination of care, additional 5 minutes spent in record review, coordination or care, ordering tests, communicating/referring to other healthcare professionals, documenting in medical records all on the same day of the visit for total time 20 minutes spent on the visit.

## 2023-09-10 ENCOUNTER — Telehealth: Payer: Self-pay

## 2023-09-10 NOTE — Telephone Encounter (Signed)
 Copied from CRM [id number]. Topic: Referral - Question >> Sep 10, 2023 10:55 AM Efraim Kaufmann C wrote: Reason for CRM: patient would like Dr. Okey Dupre to send referral to Dr. Emelia Loron concerning hernia issue ASAP. He stated this was spoken about at last appointment, however since it has been a few years since he saw Dr. Dwain Sarna, he needs new referral. Fax number is [phone]. If you have any questions, please advise with patient and patient also asked that you give him a call once faxed so he knows to contact Dr. Dwain Sarna. Thank you

## 2023-09-10 NOTE — Telephone Encounter (Signed)
 Copied from CRM [id number]. Topic: Clinical - Medical Advice >> Sep 10, 2023  4:02 PM Melissa C wrote: Reason for CRM: patient asked that Amil Amen give him a call regarding the referral question he posed earlier in the day. Thank you

## 2023-09-13 ENCOUNTER — Telehealth: Payer: Self-pay

## 2023-09-13 NOTE — Telephone Encounter (Signed)
 I would recommend he tried options we talked about first including massage and voltaren gel to the area.

## 2023-09-13 NOTE — Telephone Encounter (Signed)
 Copied from CRM [id number]. Topic: General - Other >> Sep 13, 2023  2:14 PM Richard [name] wrote: Reason for CRM: Patient is calling in regarding the questions he had about a referral and relating to providers previous message, agent advised patient and patient states he wants to speak to a nurse at the office, please reach out to patient.  Richard [name] [phone]

## 2023-09-14 ENCOUNTER — Encounter: Payer: Self-pay | Admitting: Internal Medicine

## 2023-09-14 DIAGNOSIS — R1032 Left lower quadrant pain: Secondary | ICD-10-CM

## 2023-09-14 NOTE — Telephone Encounter (Signed)
 Juat sent a message to have them read STAT

## 2023-09-14 NOTE — Telephone Encounter (Signed)
 Can you call the radiology number to get his hip x-ray read before we place this referral? If this is his hip he would need different treatment.

## 2023-09-15 NOTE — Telephone Encounter (Signed)
 Patient is wanting to have this referral placed before doing anything else that you advised.

## 2023-09-16 NOTE — Addendum Note (Signed)
 Addended by: Hillard Danker A on: 09/16/2023 11:31 AM   Modules accepted: Orders

## 2023-09-20 ENCOUNTER — Telehealth: Payer: Self-pay | Admitting: Internal Medicine

## 2023-09-20 NOTE — Telephone Encounter (Signed)
 Copied from CRM [id number]. Topic: Referral - Status >> Sep 20, 2023 10:26 AM Isabell A wrote: Reason for CRM: Olegario Messier from [facility] calling to let PCP know patient is scheduled for 10/15/23 @ 9:50 am with Dr.Wakefield.

## 2023-09-22 DIAGNOSIS — M75101 Unspecified rotator cuff tear or rupture of right shoulder, not specified as traumatic: Secondary | ICD-10-CM | POA: Diagnosis not present

## 2023-09-22 DIAGNOSIS — M75102 Unspecified rotator cuff tear or rupture of left shoulder, not specified as traumatic: Secondary | ICD-10-CM | POA: Diagnosis not present

## 2023-09-24 DIAGNOSIS — I83892 Varicose veins of left lower extremities with other complications: Secondary | ICD-10-CM | POA: Diagnosis not present

## 2023-10-08 DIAGNOSIS — I83892 Varicose veins of left lower extremities with other complications: Secondary | ICD-10-CM | POA: Diagnosis not present

## 2023-10-08 DIAGNOSIS — I83812 Varicose veins of left lower extremities with pain: Secondary | ICD-10-CM | POA: Diagnosis not present

## 2023-10-08 DIAGNOSIS — M7989 Other specified soft tissue disorders: Secondary | ICD-10-CM | POA: Diagnosis not present

## 2023-10-15 DIAGNOSIS — R1032 Left lower quadrant pain: Secondary | ICD-10-CM | POA: Diagnosis not present

## 2023-10-19 ENCOUNTER — Other Ambulatory Visit: Payer: Self-pay | Admitting: Rheumatology

## 2023-10-20 ENCOUNTER — Encounter (HOSPITAL_BASED_OUTPATIENT_CLINIC_OR_DEPARTMENT_OTHER): Payer: Self-pay

## 2023-10-20 ENCOUNTER — Emergency Department (HOSPITAL_BASED_OUTPATIENT_CLINIC_OR_DEPARTMENT_OTHER)

## 2023-10-20 ENCOUNTER — Other Ambulatory Visit: Payer: Self-pay

## 2023-10-20 ENCOUNTER — Emergency Department (HOSPITAL_BASED_OUTPATIENT_CLINIC_OR_DEPARTMENT_OTHER)
Admission: EM | Admit: 2023-10-20 | Discharge: 2023-10-20 | Disposition: A | Discharge status: Home / Self Care | Attending: Emergency Medicine | Admitting: Emergency Medicine

## 2023-10-20 ENCOUNTER — Telehealth: Payer: Self-pay

## 2023-10-20 DIAGNOSIS — S0990XA Unspecified injury of head, initial encounter: Secondary | ICD-10-CM | POA: Diagnosis not present

## 2023-10-20 DIAGNOSIS — Z7901 Long term (current) use of anticoagulants: Secondary | ICD-10-CM | POA: Insufficient documentation

## 2023-10-20 DIAGNOSIS — W228XXA Striking against or struck by other objects, initial encounter: Secondary | ICD-10-CM | POA: Diagnosis not present

## 2023-10-20 NOTE — ED Provider Notes (Signed)
 [place] EMERGENCY DEPARTMENT AT [facility] Provider Note   CSN: [id number] Arrival date & time: 10/20/23  2014     History Chief Complaint  Patient presents with   Head Injury    HPI Jevaughn Degollado is a 78 y.o. male presenting for chief complaint of closed head injury. Hit head on [employer] at [employer]. On Eliquis.  A little blurry vision.   Patient's recorded medical, surgical, social, medication list and allergies were reviewed in the Snapshot window as part of the initial history.   Review of Systems   Review of Systems  Constitutional:  Negative for chills and fever.  HENT:  Negative for ear pain and sore throat.   Eyes:  Negative for pain and visual disturbance.  Respiratory:  Negative for cough and shortness of breath.   Cardiovascular:  Negative for chest pain and palpitations.  Gastrointestinal:  Negative for abdominal pain and vomiting.  Genitourinary:  Negative for dysuria and hematuria.  Musculoskeletal:  Negative for arthralgias and back pain.  Skin:  Negative for color change and rash.  Neurological:  Positive for headaches. Negative for seizures and syncope.  All other systems reviewed and are negative.   Physical Exam Updated Vital Signs BP (!) 135/97 (BP Location: Right Arm)   Pulse (!) 53   Temp 97.8 F (36.6 C) (Oral)   Resp 20   Ht 5\' 10"  (1.778 m)   Wt 70.8 kg   SpO2 100%   BMI 22.38 kg/m  Physical Exam Vitals and nursing note reviewed.  Constitutional:      General: He is not in acute distress.    Appearance: He is well-developed.  HENT:     Head: Normocephalic and atraumatic.  Eyes:     Conjunctiva/sclera: Conjunctivae normal.  Cardiovascular:     Rate and Rhythm: Normal rate and regular rhythm.  Pulmonary:     Effort: Pulmonary effort is normal. No respiratory distress.  Abdominal:     General: Abdomen is flat. There is no distension.  Musculoskeletal:        General: No swelling or deformity.  Skin:    General: Skin is  warm and dry.     Capillary Refill: Capillary refill takes less than 2 seconds.  Neurological:     Mental Status: He is alert and oriented to person, place, and time. Mental status is at baseline.      ED Course/ Medical Decision Making/ A&P    Procedures Procedures   Medications Ordered in ED Medications - No data to display Medical Decision Making:    Dyke Weible is a 78 y.o. male who presented to the ED today with a moderate mechanisma trauma, detailed above.    Given this mechanism of trauma, a full physical exam was performed. Notably, patient was HDS in NAD.   Reviewed and confirmed nursing documentation for past medical history, family history, social history.    Initial Assessment/Plan:   This is a patient presenting with a moderate mechanism trauma.  As such, I have considered intracranial injuries including intracranial hemorrhage, intrathoracic injuries including blunt myocardial or blunt lung injury, blunt abdominal injuries including aortic dissection, bladder injury, spleen injury, liver injury and I have considered orthopedic injuries including extremity or spinal injury.  With the patient's presentation of moderate mechanism trauma but an otherwise reassuring exam, patient warrants targeted evaluation for potential traumatic injuries. Will proceed with targeted evaluation for potential injuries. Will proceed with CT Head.   Final Reassessment and Plan:   CT head  without focal pathology.  No evidence of fracture nor intracranial injury.  Patient in no acute distress stable for outpatient care and management.   Disposition:  I have considered need for hospitalization, however, considering all of the above, I believe this patient is stable for discharge at this time.  Patient/family educated about specific return precautions for given chief complaint and symptoms.  Patient/family educated about follow-up with PCP.     Patient/family expressed understanding of  return precautions and need for follow-up. Patient spoken to regarding all imaging and laboratory results and appropriate follow up for these results. All education provided in verbal form with additional information in written form. Time was allowed for answering of patient questions. Patient discharged.    Emergency Department Medication Summary:   Medications - No data to display        Clinical Impression:  1. Injury of head, initial encounter      Discharge   Final Clinical Impression(s) / ED Diagnoses Final diagnoses:  Injury of head, initial encounter    Rx / DC Orders ED Discharge Orders     None         Onetha Bile, MD 10/20/23 2321

## 2023-10-20 NOTE — Telephone Encounter (Signed)
 Patient contacted the office and states he would like a three month refill of Hydroxychloroquine sent to [facility]. Patient states he knows it is early but does not want to risk getting his medication late. Advised the patient I would put in a message. Advised the patient we need an updated PLQ eye exam. Patient states he will contact his eye dr. to have them fax us  his most recent exam. Pending eye exam, need to send a refill request to Dr. Alvira Josephs or Carolynne Citron.

## 2023-10-20 NOTE — ED Triage Notes (Signed)
 Pt reports striking his head against metal beam while shopping at [employer] around 1400 hours. Pt denies any LOC. Pt endorses R sided headache. Pt denies any blurred vision.

## 2023-10-21 NOTE — Telephone Encounter (Signed)
 Received eye exam office notes. It does not states if patient's exam is normal and if he can continue PLQ. Faxed our form to eye doctor. Advised patient.

## 2023-10-26 NOTE — Telephone Encounter (Signed)
 Received our eye exam form back from [facility] and they advised us  the patient is no longer followed by their office and to contact Dr. Lyell Samuel Ablott's office for this information. Faxed our eye exam paper to Dr. Evelene Hint office to complete.

## 2023-10-27 ENCOUNTER — Ambulatory Visit (HOSPITAL_BASED_OUTPATIENT_CLINIC_OR_DEPARTMENT_OTHER): Admitting: Orthopaedic Surgery

## 2023-10-27 ENCOUNTER — Ambulatory Visit (HOSPITAL_BASED_OUTPATIENT_CLINIC_OR_DEPARTMENT_OTHER)

## 2023-10-27 ENCOUNTER — Ambulatory Visit (HOSPITAL_BASED_OUTPATIENT_CLINIC_OR_DEPARTMENT_OTHER): Payer: Self-pay | Admitting: Orthopaedic Surgery

## 2023-10-27 DIAGNOSIS — G8929 Other chronic pain: Secondary | ICD-10-CM

## 2023-10-27 DIAGNOSIS — M25512 Pain in left shoulder: Secondary | ICD-10-CM | POA: Diagnosis not present

## 2023-10-27 DIAGNOSIS — M19011 Primary osteoarthritis, right shoulder: Secondary | ICD-10-CM | POA: Diagnosis not present

## 2023-10-27 DIAGNOSIS — M19012 Primary osteoarthritis, left shoulder: Secondary | ICD-10-CM | POA: Diagnosis not present

## 2023-10-27 DIAGNOSIS — M25511 Pain in right shoulder: Secondary | ICD-10-CM

## 2023-10-27 NOTE — Progress Notes (Signed)
 Chief Complaint: Bilateral shoulder pain     History of Present Illness:    Richard [name] is a 78 y.o. male presents today with bilateral shoulder pain.  He has been experiencing this for many years.  He has had multiple injections into both the right and left shoulder in the past.  He has participated in physical therapy for strengthening in the past of both shoulders without any relief.  He continues to have persistent pain with overhead range of motion.  He is right-hand dominant.  He does enjoy being very active with his wife.  He has on anticoagulation from his heart perspective.  He is having a very difficult time laying directly on the side as well.  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    Atrial fibrillation (HCC)    Back pain    Dyspnea    history of covid 09/2018 and with extertion    Dysrhythmia    GERD (gastroesophageal reflux disease)    Headache    Hypertension    Pain    PAIN AND OA RT KNEE   Pneumonia    YEARS AGO   Psoriasis    arthritis   Snoring 04/27/2014   Past Surgical History:  Procedure Laterality Date   CATARACT EXTRACTION Bilateral    INGUINAL HERNIA REPAIR Left 01/29/2021   Procedure: LEFT INGUINAL HERNIA REPAIR WITH MESH;  Surgeon: Enid Harry, MD;  Location: [facility];  Service: General;  Laterality: Left;   KNEE ARTHROSCOPY Right 01/31/2014   Procedure: RIGHT ARTHROSCOPY KNEE WITH DEBRIDMENT CHONDROPLASTY;  Surgeon: Aurther Blue, MD;  Location: [facility];  Service: Orthopedics;  Laterality: Right;   LUMP REMOVED FROM LEFT BREAST Left 07/06/2006   STATES SURGERY  AT [facility] - LOCAL- NOT CANCER 5 OR MORE YRS AGO   MORTON'S NEUROMA REMOVED     TONSILLECTOMY     AS A CHILD   TRANSTHORACIC ECHOCARDIOGRAM  07/2020   Normal LV size and function.  GR 1 DD.  Mild RV  enlargement.  Moderate LA & RA enlargement.  Mild MR.  MR likely related to mildly for MR/MVP.  Moderate TR, mildly elevated RAP.Aaron Aas  Moderate ao valve thickening.  Mild AOV sclerosis but no stenosis.   Social History   Socioeconomic History   Marital status: Married    Spouse name: Not on file   Number of children: 0   Years of education: Not on file   Highest education level: Bachelor's degree (e.g., BA, AB, BS)  Occupational History   Not on file  Tobacco Use   Smoking status: Former    Current packs/day: 0.00    Types: Cigarettes    Quit date: 1990    Years since quitting: 35.3    Passive exposure: Past   Smokeless tobacco: Never  Vaping Use   Vaping status: Never Used  Substance and Sexual Activity   Alcohol use: Yes    Alcohol/week: 7.0 standard drinks of alcohol    Types: 7 Glasses of wine per week    Comment: QUIT SMOKING ABOUT 15 YRS AGO--WAS ONLY 2 OR 3 CIGARETTES WITH A BEER ON WEEK ENDS   Drug use: No   Sexual activity: Yes  Other Topics Concern   Not on file  Social History Narrative   Right handed, caffeine 2-3  cups daily, married, no kids, has baby bird.  WC back injury, so not working at this time.  BS in econmics.   Social Drivers of Corporate investment banker Strain: Patient Declined (06/08/2023)   Overall Financial Resource Strain (CARDIA)    Difficulty of Paying Living Expenses: Patient declined  Food Insecurity: Patient Declined (06/08/2023)   Hunger Vital Sign    Worried About Running Out of Food in the Last Year: Patient declined    Ran Out of Food in the Last Year: Patient declined  Transportation Needs: Patient Declined (06/08/2023)   PRAPARE - Administrator, Civil Service (Medical): Patient declined    Lack of Transportation (Non-Medical): Patient declined  Physical Activity: Not on file  Stress: Not on file  Social Connections: Unknown (06/08/2023)   Social Connection and Isolation Panel [NHANES]    Frequency of Communication with  Friends and Family: Patient declined    Frequency of Social Gatherings with Friends and Family: Patient declined    Attends Religious Services: Patient declined    Database administrator or Organizations: No    Attends Engineer, structural: Not on file    Marital Status: Patient declined   Family History  Problem Relation Age of Onset   Diabetes Mother    Hypertension Mother    Heart disease Mother        has a stent    Stroke Father 31   Hypertension Father    Stroke Brother    Parkinson's disease Brother    Allergies  Allergen Reactions   Azithromycin Diarrhea and Nausea And Vomiting   Latex     WHEN PT USED TO WEAR LATEX GLOVES AT WORK HE NOTICED SKIN REDNESS AND ITCHING OF HANDS.   Prednisone Other (See Comments)    "makes me angry and I feel weird."   Current Outpatient Medications  Medication Sig Dispense Refill   CALCIUM -MAGNESIUM-ZINC PO Take 1 tablet by mouth 2 (two) times daily.      cholecalciferol (VITAMIN D ) 1000 UNITS tablet Take 1,000 Units by mouth daily.      clobetasol  cream (TEMOVATE ) 0.05 % Apply 1 Application topically 2 (two) times daily. To scaly areas on back, abdomen and left arm for 2 weeks then stop. Take a 2 week break before restarting. 60 g 2   Clobetasol  Propionate (TEMOVATE ) 0.05 % external spray Apply topically 2 (two) times daily. 59 mL 6   Cod Liver Oil CAPS Take 1 capsule by mouth every morning.     cyclobenzaprine  (FLEXERIL ) 10 MG tablet TAKE 1 TABLET BY MOUTH AT  BEDTIME 90 tablet 1   ELIQUIS  5 MG TABS tablet TAKE 1 TABLET BY MOUTH TWICE  DAILY 200 tablet 2   famotidine  (PEPCID ) 20 MG tablet TAKE 1 TABLET BY MOUTH DAILY 100 tablet 2   fluticasone  (FLONASE ) 50 MCG/ACT nasal spray Place 2 sprays into both nostrils daily. 48 g 3   hydroxychloroquine  (PLAQUENIL ) 200 MG tablet Take 1 tablet (200 mg total) by mouth 2 (two) times daily. 180 tablet 0   lisinopril -hydrochlorothiazide  (ZESTORETIC ) 10-12.5 MG tablet TAKE 1 TABLET BY MOUTH IN  THE  MORNING 100 tablet 2   Lutein-Zeaxanthin 25-5 MG CAPS Take 1 capsule by mouth every morning.     misoprostol  (CYTOTEC ) 100 MCG tablet TAKE 1 TABLET BY MOUTH TWICE  DAILY AS NEEDED 240 tablet 2   Multiple Vitamin (MULTIVITAMIN WITH MINERALS) TABS tablet Take 1 tablet by mouth daily.     rosuvastatin  (  CRESTOR ) 40 MG tablet Take 1 tablet (40 mg total) by mouth daily. 90 tablet 3   triamcinolone  cream (KENALOG ) 0.1 % Apply 1 application topically daily. Apply to psoriasis rash (Patient taking differently: Apply 1 application  topically daily. Apply to psoriasis rash. Pt takes as needed.) 30 g 1   vitamin B-12 (CYANOCOBALAMIN ) 1000 MCG tablet Take 1,000 mcg by mouth daily.     No current facility-administered medications for this visit.   No results found.  Review of Systems:   A ROS was performed including pertinent positives and negatives as documented in the HPI.  Physical Exam :   Constitutional: NAD and appears stated age Neurological: Alert and oriented Psych: Appropriate affect and cooperative There were no vitals taken for this visit.   Comprehensive Musculoskeletal Exam:    Forward elevation of both shoulders is to 110 degrees.  External rotation is to 15 degrees bilaterally.  Internal rotation is to side bilaterally.  There is weakness with forward elevation and 4 out of 5 strength bilaterally   Imaging:   Xray (3 views right shoulder, 3 views left shoulder): Evidence of rotator cuff arthropathy bilaterally with degenerative findings about the glenohumeral joint bilaterally    I personally reviewed and interpreted the radiographs.   Assessment and Plan:   78 y.o. 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  At today's visit I did discuss that he has trialed both injections as well as therapy in the past.  At this time he has very limited overhead range of motion with significant pain.  Given this we did discuss possibility of surgical intervention.  I do believe he  would ultimately benefit from reverse shoulder arthroplasty on both sides.  That being said he would like to pursue the right side first as this is a dominant side.  I discussed the risk and limitations.  I did discuss all of the associated recovery timeframe.  He would like to proceed with this  -Plan for right shoulder reverse shoulder arthroplasty   After a lengthy discussion of treatment options, including risks, benefits, alternatives, complications of surgical and nonsurgical conservative options, the patient elected surgical repair.   The patient  is aware of the material risks  and complications including, but not limited to injury to adjacent structures, neurovascular injury, infection, numbness, bleeding, implant failure, thermal burns, stiffness, persistent pain, failure to heal, disease transmission from allograft, need for further surgery, dislocation, anesthetic risks, blood clots, risks of death,and others. The probabilities of surgical success and failure discussed with patient given their particular co-morbidities.The time and nature of expected rehabilitation and recovery was discussed.The patient's questions were all answered preoperatively.  No barriers to understanding were noted. I explained the natural history of the disease process and Rx rationale.  I explained to the patient what I considered to be reasonable expectations given their personal situation.  The final treatment plan was arrived at through a shared patient decision making process model.    I personally saw and evaluated the patient, and participated in the management and treatment plan.  Wilhelmenia Harada, MD Attending Physician, Orthopedic Surgery  This document was dictated using Dragon voice recognition software. A reasonable attempt at proof reading has been made to minimize errors.

## 2023-10-28 ENCOUNTER — Telehealth: Payer: Self-pay | Admitting: *Deleted

## 2023-10-28 NOTE — Telephone Encounter (Signed)
 Spoke with pt.  He advised that he wasn't planning to have this surgery at lest 6 months or 1 year.  Advised pt that we wouldn't be able to provide clearance that far in advance and that we will have the surgeon's office resend the clearance back over once the surgery has been scheduled.

## 2023-10-28 NOTE — Telephone Encounter (Signed)
   Pre-operative Risk Assessment    Patient Name: Richard [name]  DOB: February 04, 1946 MRN: [id number]   Date of last office visit: 04/23/2023 Date of next office visit: 01/14/2024   Request for Surgical Clearance    Procedure:   RIGHT REVERSE SHOULDER ARTHROPLASTY  Date of Surgery:  Clearance TBD                                Surgeon:   Surgeon's Group or Practice Name:  [facility] Phone number:  [phone] Fax number:  [phone]   Type of Clearance Requested:  -Medical  - Pharmacy:  Hold Apixaban  (Eliquis ) X'S 2 DAYS   Type of Anesthesia:  General    Additional requests/questions:    Berenda Breaker   10/28/2023, 8:46 AM

## 2023-10-28 NOTE — Telephone Encounter (Signed)
 Patient is returning call. Please advise?

## 2023-10-28 NOTE — Telephone Encounter (Signed)
 Patient with diagnosis of A Fib on Eliquis  for anticoagulation.    Procedure: RIGHT REVERSE SHOULDER ARTHROPLASTY  Date of procedure: TBD   CHA2DS2-VASc Score = 4  This indicates a 4.8% annual risk of stroke. The patient's score is based upon: CHF History: 0 HTN History: 1 Diabetes History: 0 Stroke History: 0 Vascular Disease History: 1 Age Score: 2 Gender Score: 0   CrCl 77 ml/min Platelet count 170K   Per office protocol, patient can hold Eliquis  for 3 days prior to procedure.    **This guidance is not considered finalized until pre-operative APP has relayed final recommendations.**

## 2023-10-28 NOTE — Telephone Encounter (Signed)
   Name: Richard [name]  DOB: 08/30/1945  MRN: [id number]  Primary Cardiologist: Alexandria Angel, MD   Preoperative team, please contact this patient and set up a phone call appointment for further preoperative risk assessment. Please obtain consent and complete medication review. Thank you for your help.  I confirm that guidance regarding antiplatelet and oral anticoagulation therapy has been completed and, if necessary, noted below.  Per office protocol, patient can hold Eliquis  for 3 days prior to procedure.   I also confirmed the patient resides in the state of [place] . As per [facility] Medical Board telemedicine laws, the patient must reside in the state in which the provider is licensed.   Carie Charity, NP 10/28/2023, 10:01 AM [facility]

## 2023-10-28 NOTE — Telephone Encounter (Signed)
 Lvm to call back to make clearance appointment.

## 2023-11-01 ENCOUNTER — Ambulatory Visit: Attending: General Surgery | Admitting: Physical Therapy

## 2023-11-01 ENCOUNTER — Other Ambulatory Visit: Payer: Self-pay

## 2023-11-01 ENCOUNTER — Telehealth: Payer: Self-pay | Admitting: Orthopaedic Surgery

## 2023-11-01 ENCOUNTER — Encounter: Payer: Self-pay | Admitting: Physical Therapy

## 2023-11-01 ENCOUNTER — Encounter (HOSPITAL_BASED_OUTPATIENT_CLINIC_OR_DEPARTMENT_OTHER): Payer: Self-pay | Admitting: Orthopaedic Surgery

## 2023-11-01 DIAGNOSIS — M25552 Pain in left hip: Secondary | ICD-10-CM | POA: Insufficient documentation

## 2023-11-01 DIAGNOSIS — R252 Cramp and spasm: Secondary | ICD-10-CM | POA: Insufficient documentation

## 2023-11-01 NOTE — Telephone Encounter (Signed)
**Note De-identified  [place] Obfuscation** Please advise 

## 2023-11-01 NOTE — Telephone Encounter (Signed)
 I spoke with the patient in regards to surgical clearances needed before scheduling surgery. Per patient, he is going to wait at least 6 months to a year before he has shoulder surgery. Patient did want to know if there is an age limit to when he would not be eligable for this surgery? Patient prefers you send response thru My Chart.

## 2023-11-01 NOTE — Patient Instructions (Signed)

## 2023-11-01 NOTE — Telephone Encounter (Signed)
 Patient advised we have faxed our PLQ eye exam form to Dr. Evander Hills office. Patient advised we have not received it back yet. Patient will reach out to his office to have them complete and send back.

## 2023-11-01 NOTE — Therapy (Signed)
 OUTPATIENT PHYSICAL THERAPY THORACOLUMBAR EVALUATION   Patient Name: Richard [name] MRN: [id number] DOB:26-Dec-1945, 78 y.o., male Today's Date: 11/01/2023  END OF SESSION:  PT End of Session - 11/01/23 1350     Visit Number 1    Date for PT Re-Evaluation 12/27/23    Authorization Type [employer] MCR - requesting auth for 1x/week x 8 weeks    Progress Note Due on Visit 10    PT Start Time 1020    PT Stop Time 1100    PT Time Calculation (min) 40 min    Activity Tolerance Patient tolerated treatment well    Behavior During Therapy WFL for tasks assessed/performed             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    Atrial fibrillation (HCC)    Back pain    Dyspnea    history of covid 09/2018 and with extertion    Dysrhythmia    GERD (gastroesophageal reflux disease)    Headache    Hypertension    Pain    PAIN AND OA RT KNEE   Pneumonia    YEARS AGO   Psoriasis    arthritis   Snoring 04/27/2014   Past Surgical History:  Procedure Laterality Date   CATARACT EXTRACTION Bilateral    INGUINAL HERNIA REPAIR Left 01/29/2021   Procedure: LEFT INGUINAL HERNIA REPAIR WITH MESH;  Surgeon: Enid Harry, MD;  Location: [facility];  Service: General;  Laterality: Left;   KNEE ARTHROSCOPY Right 01/31/2014   Procedure: RIGHT ARTHROSCOPY KNEE WITH DEBRIDMENT CHONDROPLASTY;  Surgeon: Aurther Blue, MD;  Location: [facility];  Service: Orthopedics;  Laterality: Right;   LUMP REMOVED FROM LEFT BREAST Left 07/06/2006   STATES SURGERY  AT [facility] - LOCAL- NOT CANCER 5 OR MORE YRS AGO   MORTON'S NEUROMA REMOVED     TONSILLECTOMY     AS A CHILD   TRANSTHORACIC ECHOCARDIOGRAM  07/2020   Normal LV size and function.  GR 1 DD.  Mild RV enlargement.  Moderate LA & RA enlargement.  Mild MR.  MR likely related to mildly for MR/MVP.  Moderate TR, mildly elevated RAP.Aaron Aas  Moderate ao valve thickening.  Mild AOV sclerosis but no  stenosis.   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   Cough, persistent 08/14/2020   Deviated septum 08/14/2020   First degree AV block 08/24/2019   NSVT (nonsustained ventricular tachycardia) (HCC) 07/25/2019   Mixed hyperlipidemia 07/25/2019   Essential hypertension 07/03/2018   Impingement syndrome of right shoulder region 11/10/2017   Gastroesophageal reflux disease without esophagitis 03/10/2017   Rhinitis, chronic 03/10/2017   Chronic low back pain without sciatica 12/17/2015   Bilateral knee pain 12/17/2015   Psoriatic arthritis (HCC) 12/17/2015   Snoring 04/27/2014    PCP: Bambi Lever, MD  REFERRING PROVIDER: Enid Harry, MD  REFERRING DIAG: R10.32 (ICD-10-CM) - Left lower quadrant pain  Rationale for Evaluation and  Treatment: Rehabilitation  THERAPY DIAG:  Pain in left hip - Plan: PT plan of care cert/re-cert  Cramp and spasm - Plan: PT plan of care cert/re-cert  ONSET DATE: 1-2 months ago - started when trying to lift leg in bed  SUBJECTIVE:                                                                                                                                                                                           SUBJECTIVE STATEMENT: Pt referred by Dr Delane Fear to address Lt groin pain with consideration for DN.  Pt with history of Lt inguinal hernia repair in 2022. Dr. Delane Fear was not convinced that he had re-herniated the groin. Pt  has pain with flexing Lt hip.  Started 1-2 mos ago when trying to raise Lt leg in bed to change positions  PERTINENT HISTORY:  Hx of Lt inguinal hernia repair 01/2021 Rt knee arthroscopy 01/2014  PAIN:  PAIN:  Are you having pain? Yes NPRS scale: 5/10 Pain location: Lt Pain orientation: Left and Proximal  PAIN TYPE: sharp Pain description: intermittent  Aggravating factors: flexing Lt hip Relieving factors: not moving   PRECAUTIONS: None  RED FLAGS: None   WEIGHT BEARING RESTRICTIONS: No  FALLS:  Has patient fallen in last 6 months? 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  DIAGNOSTIC FINDINGS:  none  PATIENT SURVEYS:  LEFS 63/80, 79%  COGNITION: Overall cognitive status: Within functional limits for tasks assessed     SENSATION: WFL  MUSCLE LENGTH:   POSTURE:  reduced lumbar lordosis, genu varum Lt  PALPATION: Significant tenderness along proximal musculotendon region of adductors Small "pebble" palpable in inguinal region - tender  LUMBAR ROM:  50-75% ROM, pain with all motions in lumbar region  LOWER EXTREMITY ROM:     Passive  Right eval Left eval  Hip flexion 105, stretch 95, stretch  Hip extension    Hip abduction 45, stretch no pain 35, stretch with pain in LT groin  Hip adduction    Hip internal rotation    Hip external rotation 20, firm 10, firm  Knee flexion    Knee extension    Ankle  dorsiflexion    Ankle plantarflexion    Ankle inversion    Ankle eversion     (Blank rows =  not tested)  LOWER EXTREMITY MMT:   Grossly 5/5 with exception of Lt hip flexion and adduction limited by pain, 4/5 Groin pain with long axis supine SLR, able to perform hooklying march without pain Poor activation of deep abdominals    FUNCTIONAL TESTS:  Able to cough, sneeze, bear down without pain Has Lt groin pain with sit up  GAIT: Distance walked:  Assistive device utilized: None Level of assistance: Modified independence Comments: flexed trunk, genu varum  TREATMENT DATE:  11/01/23:      Discussed evaluation findings and POC Initiated HEP and gave DN handout                                                                                                                              PATIENT EDUCATION:  Education details: [email or web address] Person educated: Patient and Spouse Education method: Explanation, Facilities manager, Verbal cues, and Handouts Education comprehension: verbalized understanding and returned demonstration  HOME EXERCISE PROGRAM: Access Code: [email or web address] URL: [email or web address] Date: 11/01/2023 Prepared by: Minor Amble Dayrin Stallone  Exercises - Supine Butterfly Groin Stretch  - 2 x daily - 7 x weekly - 1 sets - 3 reps - 30 sec hold - Supine March  - 2 x daily - 7 x weekly - 2 sets - 10 reps  ASSESSMENT:  CLINICAL IMPRESSION: Patient is a 78 y.o. male who was seen today for physical therapy evaluation and treatment for Lt groin pain.  Pt does have a history of Lt inguinal hernia repair but MD feels this is likely more of a muscle pull.  Pain started 1-2 mos ago when patient lifted leg in bed.  He has no pain with ambulation but pain is recreated with exaggerated march, SLR from supine, sit up, and with palpation along proximal adductors.  He does have a small "pebble" sized area between ASIS and pubic ramus that is tender, but could be scar tissue.  Will need close monitoring of response to therapy for Lt groin strain to see if symptoms improve.  Pt  presents with signif ROM restrictions in trunk, Lt>Rt hip and postural dysfunction.  He will benefit from skilled PT to address pain and maximize participation in daily activities.  OBJECTIVE IMPAIRMENTS: Abnormal gait, decreased ROM, increased muscle spasms, impaired flexibility, improper body mechanics, postural dysfunction, and pain.   ACTIVITY LIMITATIONS: squatting, bed mobility, and locomotion level  PARTICIPATION LIMITATIONS: cleaning, community activity, and yard work  PERSONAL FACTORS: 1 comorbidity: Hx of inguinal hernia repair  are also affecting patient's functional outcome.   REHAB POTENTIAL: Excellent  CLINICAL DECISION MAKING: Stable/uncomplicated  EVALUATION COMPLEXITY: Moderate   GOALS: Goals reviewed with patient? Yes  SHORT TERM GOALS: Target date: 11/29/23  Pt will be ind with initial HEP Baseline: Goal status: INITIAL  2.  Pt will report at least 50% reduction in pain with  bed mobility Baseline:  Goal status: INITIAL    LONG TERM GOALS: Target date: 12/27/23  Pt will be ind with advanced HEP Baseline:  Goal status: INITIAL  2.  Pt will improve LEFS to at least 70/80 to demo improved function Baseline: 63/80 Goal status: INITIAL  3.  Pt will report reduced pain in Lt groin by at least 75% with all activities Baseline:  Goal status: INITIAL  4.  Pt will learn how to manage abdominal pressures with proper body mechanics and breathing techniques with global functional exercise such as squats. Baseline:  Goal status: INITIAL    PLAN:  PT FREQUENCY: 1-2x/week  PT DURATION: 8 weeks  PLANNED INTERVENTIONS: 97110-Therapeutic exercises, 97530- Therapeutic activity, [id number]- Neuromuscular re-education, 97535- Self Care, [id number]- Manual therapy, [phone]- Gait training, [phone]- Aquatic Therapy, [phone]- Ionotophoresis 4mg /ml Dexamethasone , Patient/Family education, Balance training, Taping, Dry Needling, Joint mobilization, Spinal mobilization, Cryotherapy, and  Moist heat.  PLAN FOR NEXT SESSION: Pt needs to be propped up in supine due to vertigo history, DN Lt adductors, review and progress HEP as tol, bil hip ROM, teach proper core stabilization, monitor for possible re-injury of inguinal hernia? Was repaired in 2022   Dyanna Seiter, PT 11/01/23 1:57 PM

## 2023-11-05 DIAGNOSIS — I83892 Varicose veins of left lower extremities with other complications: Secondary | ICD-10-CM | POA: Diagnosis not present

## 2023-11-05 DIAGNOSIS — I87392 Chronic venous hypertension (idiopathic) with other complications of left lower extremity: Secondary | ICD-10-CM | POA: Diagnosis not present

## 2023-11-12 ENCOUNTER — Other Ambulatory Visit: Payer: Self-pay | Admitting: Rheumatology

## 2023-11-12 ENCOUNTER — Encounter: Admitting: Physical Therapy

## 2023-11-12 NOTE — Telephone Encounter (Signed)
 Patient contacted the office to request a medication refill.   1. Name of Medication: Plaquenil   2. How are you currently taking this medication (dosage and times per day)? Pt stated Twice a day and not sure what the dosage is    3. What pharmacy would you like for that to be sent to? [facility]    Pt stated he got his eye doctor sent the plaquenil  eye exam over in April and wanted to see if we received that fax.

## 2023-11-12 NOTE — Telephone Encounter (Signed)
 Spoke with patient and advised we have not received his PLQ eye exam form. Patient advised that once we receive it we can send in his medication. Patient took down the fax number so he could verify it with the eye doctor and states he will call them now to stress the importance of getting the information to us .

## 2023-11-15 MED ORDER — HYDROXYCHLOROQUINE SULFATE 200 MG PO TABS: 200.0000 mg | ORAL_TABLET | Freq: Two times a day (BID) | ORAL | 0 refills | Status: DC

## 2023-11-15 NOTE — Telephone Encounter (Signed)
 Last Fill: 08/25/2023  Eye exam: 08/05/2023 WNL   Labs: 09/01/2023 RDW 15.8  Next Visit: 12/24/2023  Last Visit: 07/20/2023  DX: Psoriatic arthritis   Current Dose per office note 07/20/2023: Plaquenil  200 twice daily   Okay to refill Plaquenil ?

## 2023-11-19 ENCOUNTER — Encounter: Payer: Self-pay | Admitting: Physical Therapy

## 2023-11-19 ENCOUNTER — Ambulatory Visit: Attending: General Surgery | Admitting: Physical Therapy

## 2023-11-19 DIAGNOSIS — M25552 Pain in left hip: Secondary | ICD-10-CM | POA: Insufficient documentation

## 2023-11-19 DIAGNOSIS — R252 Cramp and spasm: Secondary | ICD-10-CM | POA: Insufficient documentation

## 2023-11-19 NOTE — Therapy (Signed)
 OUTPATIENT PHYSICAL THERAPY THORACOLUMBAR TREATMENT   Patient Name: Richard [name] MRN: [id number] DOB:29-Jun-1946, 78 y.o., male Today's Date: 11/19/2023  END OF SESSION:  PT End of Session - 11/19/23 0940     Visit Number 2    Date for PT Re-Evaluation 12/27/23    Authorization Type UHC MCR - [employer] authorized 8 visits 11/01/23-12/27/23 [id number]    Authorization - Visit Number 2    Authorization - Number of Visits 8    Progress Note Due on Visit 10    PT Start Time 0935    PT Stop Time 1013    PT Time Calculation (min) 38 min    Activity Tolerance Patient tolerated treatment well    Behavior During Therapy WFL for tasks assessed/performed              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    Atrial fibrillation (HCC)    Back pain    Dyspnea    history of covid 09/2018 and with extertion    Dysrhythmia    GERD (gastroesophageal reflux disease)    Headache    Hypertension    Pain    PAIN AND OA RT KNEE   Pneumonia    YEARS AGO   Psoriasis    arthritis   Snoring 04/27/2014   Past Surgical History:  Procedure Laterality Date   CATARACT EXTRACTION Bilateral    INGUINAL HERNIA REPAIR Left 01/29/2021   Procedure: LEFT INGUINAL HERNIA REPAIR WITH MESH;  Surgeon: Enid Harry, MD;  Location: [facility];  Service: General;  Laterality: Left;   KNEE ARTHROSCOPY Right 01/31/2014   Procedure: RIGHT ARTHROSCOPY KNEE WITH DEBRIDMENT CHONDROPLASTY;  Surgeon: Aurther Blue, MD;  Location: [facility];  Service: Orthopedics;  Laterality: Right;   LUMP REMOVED FROM LEFT BREAST Left 07/06/2006   STATES SURGERY  AT [facility] - LOCAL- NOT CANCER 5 OR MORE YRS AGO   MORTON'S NEUROMA REMOVED     TONSILLECTOMY     AS A CHILD   TRANSTHORACIC ECHOCARDIOGRAM  07/2020   Normal LV size and function.  GR 1 DD.  Mild RV enlargement.  Moderate LA & RA enlargement.  Mild MR.  MR likely related to mildly for MR/MVP.   Moderate TR, mildly elevated RAP.Aaron Aas  Moderate ao valve thickening.  Mild AOV sclerosis but no stenosis.   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   Cough, persistent 08/14/2020   Deviated septum 08/14/2020   First degree AV block 08/24/2019   NSVT (nonsustained ventricular tachycardia) (HCC) 07/25/2019   Mixed hyperlipidemia 07/25/2019   Essential hypertension 07/03/2018   Impingement syndrome of right shoulder region 11/10/2017   Gastroesophageal reflux disease without esophagitis 03/10/2017   Rhinitis, chronic 03/10/2017   Chronic low back pain without sciatica 12/17/2015   Bilateral knee pain 12/17/2015   Psoriatic arthritis (HCC) 12/17/2015   Snoring 04/27/2014    PCP: Bambi Lever, MD  REFERRING PROVIDER: Delane Fear,  Zoila Hines, MD  REFERRING DIAG: R10.32 (ICD-10-CM) - Left lower quadrant pain  Rationale for Evaluation and Treatment: Rehabilitation  THERAPY DIAG:  Pain in left hip  Cramp and spasm  ONSET DATE: 1-2 months ago - started when trying to lift leg in bed  SUBJECTIVE:                                                                                                                                                                                           SUBJECTIVE STATEMENT: My left side is feeling 30% better.  I can still feel the twinge with I move it to roll over in bed but overall I'm walking better.  I am having some of the same pain on the Rt  now.  Eval: Pt referred by Dr Delane Fear to address Lt groin pain with consideration for DN.  Pt with history of Lt inguinal hernia repair in 2022. Dr. Delane Fear was not convinced that he had re-herniated the groin. Pt has pain with flexing Lt hip.  Started 1-2 mos ago when trying to raise Lt leg in bed to change positions  PERTINENT HISTORY:  Hx of Lt inguinal hernia repair 01/2021 Rt knee arthroscopy 01/2014  PAIN:  PAIN:  Are you having pain? Yes NPRS scale: 3-4/10 Pain location: Lt Pain orientation: Left and Proximal  PAIN TYPE: sharp Pain description: intermittent  Aggravating factors: flexing Lt hip Relieving factors: not moving   PRECAUTIONS: None  RED FLAGS: None   WEIGHT BEARING RESTRICTIONS: No  FALLS:  Has patient fallen in last 6 months? 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  DIAGNOSTIC FINDINGS:  none  PATIENT SURVEYS:  LEFS 63/80, 79%  COGNITION: Overall cognitive status: Within functional limits for tasks assessed     SENSATION: WFL  MUSCLE LENGTH:   POSTURE: reduced lumbar lordosis, genu varum Lt  PALPATION: Significant tenderness along proximal musculotendon region of adductors Small "pebble" palpable in inguinal region - tender  LUMBAR ROM:  50-75% ROM, pain with all motions in lumbar region  LOWER EXTREMITY ROM:     Passive  Right eval Left eval  Hip flexion 105, stretch 95, stretch  Hip extension  Hip abduction 45, stretch no pain 35, stretch with pain in LT groin   Hip adduction    Hip internal rotation    Hip external rotation 20, firm 10, firm  Knee flexion    Knee extension    Ankle dorsiflexion    Ankle plantarflexion    Ankle inversion    Ankle eversion     (Blank rows = not tested)  LOWER EXTREMITY MMT:   Grossly 5/5 with exception of Lt hip flexion and adduction limited by pain, 4/5 Groin pain with long axis supine SLR, able to perform hooklying march without pain Poor activation of deep abdominals    FUNCTIONAL TESTS:  Able to cough, sneeze, bear down without pain Has Lt groin pain with sit up  GAIT: Distance walked:  Assistive device utilized: None Level of assistance: Modified independence Comments: flexed trunk, genu varum  TREATMENT DATE:  11/19/23: NuStep L1 x6' PT present to monitor symptoms Supine with pillow prop for head: assisted heel slides with strap x10 each LE Supine posterior pelvic tilt x10 PPT with small range LE march in hooklying x10 each LE Supine hip abduction x10 each LE - signif adductor stretch with hip abd Hip flexor stretch rocking in/out foot on 1st step - 2x10 each LE Seated yellow medicine ball - 3-way chest press x5, hip to shoulder x5 (some shoulder pain with this) Sit to stand from chair x5 Lap around gym holding 10lb kbell at side each UE Updated HEP   11/01/23: Discussed evaluation findings and POC Initiated HEP and gave DN handout                                                                                                                              PATIENT EDUCATION:  Education details: [email or web address] Person educated: Patient and Spouse Education method: Explanation, Facilities manager, Verbal cues, and Handouts Education comprehension: verbalized understanding and returned demonstration  HOME EXERCISE PROGRAM: Access Code: [email or web address] URL: [email or web address] Date: 11/19/2023 Prepared by: Minor Amble Aadam Zhen  Exercises - Supine Heel Slide  - 1 x daily - 7 x weekly - 3  sets - 10 reps - Supine Posterior Pelvic Tilt  - 1 x daily - 7 x weekly - 3 sets - 10 reps - Supine March with Posterior Pelvic Tilt  - 1 x daily - 7 x weekly - 3 sets - 10 reps - Supine Hip Abduction  - 1 x daily - 7 x weekly - 3 sets - 10 reps - Supine Butterfly Groin Stretch  - 2 x daily - 7 x weekly - 1 sets - 3 reps - 30 sec hold  ASSESSMENT:  CLINICAL IMPRESSION: Patient reports 30% improvement in Lt hip pain.  He wanted to skip the DN today to see how things went.  He has started to have some of the same pain on the Rt.  He is able to perform supported and assisted hip flexion without pain.  He has signif tightness in  adductor and hip flexor muscles so we gently worked on stretching these today.  Pt reported fatigue and some bil shoulder pain end of session as we trialed some core overlay in sitting with UE medicine ball today.  Supine HEP updated today.  OBJECTIVE IMPAIRMENTS: Abnormal gait, decreased ROM, increased muscle spasms, impaired flexibility, improper body mechanics, postural dysfunction, and pain.   ACTIVITY LIMITATIONS: squatting, bed mobility, and locomotion level  PARTICIPATION LIMITATIONS: cleaning, community activity, and yard work  PERSONAL FACTORS: 1 comorbidity: Hx of inguinal hernia repair are also affecting patient's functional outcome.   REHAB POTENTIAL: Excellent  CLINICAL DECISION MAKING: Stable/uncomplicated  EVALUATION COMPLEXITY: Moderate   GOALS: Goals reviewed with patient? Yes  SHORT TERM GOALS: Target date: 11/29/23  Pt will be ind with initial HEP Baseline: Goal status: ongoing 5/16  2.  Pt will report at least 50% reduction in pain with bed mobility Baseline:  Goal status: ongoing 30% 5/16    LONG TERM GOALS: Target date: 12/27/23  Pt will be ind with advanced HEP Baseline:  Goal status: INITIAL  2.  Pt will improve LEFS to at least 70/80 to demo improved function Baseline: 63/80 Goal status: INITIAL  3.  Pt will report reduced  pain in Lt groin by at least 75% with all activities Baseline:  Goal status: INITIAL  4.  Pt will learn how to manage abdominal pressures with proper body mechanics and breathing techniques with global functional exercise such as squats. Baseline:  Goal status: INITIAL    PLAN:  PT FREQUENCY: 1-2x/week  PT DURATION: 8 weeks  PLANNED INTERVENTIONS: 97110-Therapeutic exercises, 97530- Therapeutic activity, [id number]- Neuromuscular re-education, 97535- Self Care, [id number]- Manual therapy, [phone]- Gait training, [phone]- Aquatic Therapy, [phone]- Ionotophoresis 4mg /ml Dexamethasone , Patient/Family education, Balance training, Taping, Dry Needling, Joint mobilization, Spinal mobilization, Cryotherapy, and Moist heat.  PLAN FOR NEXT SESSION: Pt needs to be propped up in supine due to vertigo history, DN Lt adductors as needed, review and progress HEP as tol, bil hip ROM, teach proper core stabilization, monitor for possible re-injury of inguinal hernia? Was repaired in 2022   Gerianne Simonet, PT 11/19/23 11:52 AM

## 2023-11-26 ENCOUNTER — Encounter: Payer: Self-pay | Admitting: Podiatry

## 2023-11-26 ENCOUNTER — Ambulatory Visit: Payer: Medicare Other | Admitting: Podiatry

## 2023-11-26 DIAGNOSIS — B351 Tinea unguium: Secondary | ICD-10-CM | POA: Diagnosis not present

## 2023-11-26 DIAGNOSIS — Z7901 Long term (current) use of anticoagulants: Secondary | ICD-10-CM

## 2023-11-26 DIAGNOSIS — M79674 Pain in right toe(s): Secondary | ICD-10-CM

## 2023-11-26 DIAGNOSIS — M79675 Pain in left toe(s): Secondary | ICD-10-CM

## 2023-11-26 DIAGNOSIS — M2041 Other hammer toe(s) (acquired), right foot: Secondary | ICD-10-CM

## 2023-11-29 ENCOUNTER — Other Ambulatory Visit: Payer: Self-pay | Admitting: Cardiology

## 2023-11-29 DIAGNOSIS — E7800 Pure hypercholesterolemia, unspecified: Secondary | ICD-10-CM

## 2023-11-30 NOTE — Progress Notes (Signed)
 Subjective: Chief Complaint  Patient presents with   RFC    RM#13 RFC     78 y.o. returns the office today for painful, elongated, thickened toenails which he cannot trim himself. Denies any redness or drainage around the nails.  No open lesions.    He also gets pain to his right foot mostly on the right third toe from where it curls on as he walks he puts increased pressure to the area.  Asked what other treatment options for this.  No open lesions or injuries.   PCP: Adelia Homestead, MD Last seen 09/08/2023  He is on Eliquis  for A-fib  Objective: AAO 3, NAD DP/PT pulses palpable, CRT less than 3 seconds Venous insuffiencey noted Nails hypertrophic, dystrophic, elongated, brittle, discolored 10. There is tenderness overlying the nails 1-5 bilaterally. There is no surrounding erythema or drainage along the nail sites.  Hammertoes present, rigid noted.  This right second toe does seem to overlap the third digit.  He has minimal callus at the distal aspect the right third toe from the pressure. No pain with calf compression, swelling, warmth, erythema.  Assessment: Patient presents with symptomatic onychomycosis, on Eliquis ; hammertoe deformity  Plan: Symptom onychosis, on Eliquis  -Nails sharply debrided 10 without complication/bleeding. -Discussed daily foot inspection. If there are any changes, to call the office immediately.   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  We discussed with conservative as well as surgical options.  Conservative discussed offloading I dispensed offloading pads discussed shoe modifications avoid excess pressure.  Should symptoms persist we discussed hammertoe repair of the right 2nd and 3rd toes at least.  He will consider his options.  Return in about 3 months (around 02/26/2024) for nail trim,  hammertoes.  Charity Conch DPM

## 2023-12-03 ENCOUNTER — Ambulatory Visit: Admitting: Physical Therapy

## 2023-12-09 ENCOUNTER — Emergency Department (HOSPITAL_BASED_OUTPATIENT_CLINIC_OR_DEPARTMENT_OTHER)
Admission: EM | Admit: 2023-12-09 | Discharge: 2023-12-09 | Disposition: A | Discharge status: Home / Self Care | Attending: Emergency Medicine | Admitting: Emergency Medicine

## 2023-12-09 ENCOUNTER — Encounter (HOSPITAL_BASED_OUTPATIENT_CLINIC_OR_DEPARTMENT_OTHER): Payer: Self-pay | Admitting: Emergency Medicine

## 2023-12-09 ENCOUNTER — Other Ambulatory Visit: Payer: Self-pay

## 2023-12-09 ENCOUNTER — Emergency Department (HOSPITAL_BASED_OUTPATIENT_CLINIC_OR_DEPARTMENT_OTHER)

## 2023-12-09 DIAGNOSIS — Z7901 Long term (current) use of anticoagulants: Secondary | ICD-10-CM | POA: Insufficient documentation

## 2023-12-09 DIAGNOSIS — W228XXA Striking against or struck by other objects, initial encounter: Secondary | ICD-10-CM | POA: Diagnosis not present

## 2023-12-09 DIAGNOSIS — I6782 Cerebral ischemia: Secondary | ICD-10-CM | POA: Diagnosis not present

## 2023-12-09 DIAGNOSIS — Z91040 Latex allergy status: Secondary | ICD-10-CM | POA: Diagnosis not present

## 2023-12-09 DIAGNOSIS — S0990XA Unspecified injury of head, initial encounter: Secondary | ICD-10-CM

## 2023-12-09 DIAGNOSIS — R519 Headache, unspecified: Secondary | ICD-10-CM | POA: Diagnosis not present
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
## 2023-12-10 ENCOUNTER — Encounter: Payer: Self-pay | Admitting: Physical Therapy

## 2023-12-10 ENCOUNTER — Ambulatory Visit: Attending: General Surgery | Admitting: Physical Therapy

## 2023-12-10 DIAGNOSIS — M25552 Pain in left hip: Secondary | ICD-10-CM | POA: Diagnosis not present

## 2023-12-10 DIAGNOSIS — R252 Cramp and spasm: Secondary | ICD-10-CM | POA: Insufficient documentation

## 2023-12-10 NOTE — Therapy (Addendum)
 OUTPATIENT PHYSICAL THERAPY THORACOLUMBAR TREATMENT/ DISCHARGE NOTE   Patient Name: Richard [name] MRN: [id number] DOB:[birth year], 78 y.o., male Today's Date: 12/10/2023  END OF SESSION:  PT End of Session - 12/10/23 1022     Visit Number 3    Date for PT Re-Evaluation 12/27/23    Authorization Type [employer] MCR - [employer] authorized 8 visits 11/01/23-12/27/23 [id number]    Authorization - Visit Number 3    Authorization - Number of Visits 8    Progress Note Due on Visit 10    PT Start Time 1017    PT Stop Time 1056    PT Time Calculation (min) 39 min    Activity Tolerance Patient tolerated treatment well    Behavior During Therapy WFL for tasks assessed/performed               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    Atrial fibrillation (HCC)    Back pain    Dyspnea    history of covid 09/2018 and with extertion    Dysrhythmia    GERD (gastroesophageal reflux disease)    Headache    Hypertension    Pain    PAIN AND OA RT KNEE   Pneumonia    YEARS AGO   Psoriasis    arthritis   Snoring 04/27/2014   Past Surgical History:  Procedure Laterality Date   CATARACT EXTRACTION Bilateral    INGUINAL HERNIA REPAIR Left 01/29/2021   Procedure: LEFT INGUINAL HERNIA REPAIR WITH MESH;  Surgeon: Enid Harry, MD;  Location: [facility];  Service: General;  Laterality: Left;   KNEE ARTHROSCOPY Right 01/31/2014   Procedure: RIGHT ARTHROSCOPY KNEE WITH DEBRIDMENT CHONDROPLASTY;  Surgeon: Aurther Blue, MD;  Location: [facility];  Service: Orthopedics;  Laterality: Right;   LUMP REMOVED FROM LEFT BREAST Left 07/06/2006   STATES SURGERY  AT [facility] - LOCAL- NOT CANCER 5 OR MORE YRS AGO   MORTON'S NEUROMA REMOVED     TONSILLECTOMY     AS A CHILD   TRANSTHORACIC ECHOCARDIOGRAM  07/2020   Normal LV size and function.  GR 1 DD.  Mild RV enlargement.  Moderate LA & RA enlargement.  Mild MR.  MR likely related to  mildly for MR/MVP.  Moderate TR, mildly elevated RAP.Aaron Aas  Moderate ao valve thickening.  Mild AOV sclerosis but no stenosis.   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   Cough, persistent 08/14/2020   Deviated septum 08/14/2020   First degree AV block 08/24/2019   NSVT (nonsustained ventricular tachycardia) (HCC) 07/25/2019   Mixed hyperlipidemia 07/25/2019   Essential hypertension 07/03/2018   Impingement syndrome of right shoulder region 11/10/2017   Gastroesophageal reflux disease without esophagitis 03/10/2017   Rhinitis, chronic 03/10/2017   Chronic low back pain without sciatica 12/17/2015   Bilateral knee pain 12/17/2015   Psoriatic arthritis (HCC) 12/17/2015   Snoring 04/27/2014    PCP: Bambi Lever, MD  REFERRING PROVIDER: Enid Harry, MD  REFERRING DIAG: R10.32 (ICD-10-CM) - Left lower quadrant pain  Rationale for Evaluation and Treatment: Rehabilitation  THERAPY DIAG:  Pain in left hip  Cramp and spasm  ONSET DATE: 1-2 months ago - started when trying to lift leg in bed  SUBJECTIVE:                                                                                                                                                                                           SUBJECTIVE STATEMENT: We overdid it last time.  I need to not do UE exercise for my shoulders.  I need surgeries there. My hip and groin are much better. My pain at worst is a 2/10 and that  is with an unexpected quick movement.  Eval: Pt referred by Dr Delane Fear to address Lt groin pain with consideration for DN.  Pt with history of Lt inguinal hernia repair in 2022. Dr. Delane Fear was not convinced that he had re-herniated the groin. Pt has pain with flexing Lt hip.  Started 1-2 mos ago when trying to raise Lt leg in bed to change positions  PERTINENT HISTORY:  Hx of Lt inguinal hernia repair 01/2021 Rt knee arthroscopy 01/2014  PAIN:  PAIN:  Are you having pain? Yes NPRS scale: 2/10 Pain location: Lt Pain orientation: Left and Proximal  PAIN TYPE: sharp Pain description: intermittent  Aggravating factors: flexing Lt hip Relieving factors: not moving   PRECAUTIONS: None  RED FLAGS: None   WEIGHT BEARING RESTRICTIONS: No  FALLS:  Has patient fallen in last 6 months? 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  DIAGNOSTIC FINDINGS:  none  PATIENT SURVEYS:  LEFS 63/80, 79%  COGNITION: Overall cognitive status: Within functional limits for tasks assessed     SENSATION: WFL  MUSCLE LENGTH:   POSTURE: reduced lumbar lordosis, genu varum Lt  PALPATION: Significant tenderness along proximal musculotendon region of adductors Small pebble palpable in inguinal region - tender  LUMBAR ROM:  50-75% ROM, pain with all motions in lumbar region  LOWER EXTREMITY ROM:     Passive  Right eval Left eval  Hip flexion 105, stretch 95, stretch  Hip extension    Hip abduction 45, stretch no pain 35,  stretch with pain in LT groin  Hip adduction    Hip internal rotation    Hip external rotation 20, firm 10, firm  Knee flexion    Knee extension    Ankle dorsiflexion    Ankle plantarflexion    Ankle inversion    Ankle eversion     (Blank rows = not tested)  LOWER EXTREMITY MMT:   Grossly 5/5 with exception of Lt hip flexion and adduction limited by pain, 4/5 Groin pain with long axis supine SLR, able to perform hooklying march without pain Poor activation of deep abdominals    FUNCTIONAL TESTS:  Able to cough, sneeze, bear down without pain Has Lt groin pain with sit up  GAIT: Distance walked:  Assistive device utilized: None Level of assistance: Modified independence Comments: flexed trunk, genu varum  TREATMENT DATE:  12/10/23: NuStep L1 x5' PT present to monitor symptoms LAQ x8 reps each (groin pain on Lt) Lt LE march up and over low disc cone x8 (pain in Lt groin) Sit to stand chair + pad x5 Hip flexor stretch at stairs rock in/out to stretch Lt side (Rt foot on 2nd step) Gait down long hallway, march walk halfway back and sidestep back/forth  the other half Supine propped STM Lt adductors (proximal) and hip flexors Supine propped butterfly stretch rocking in/out x20 Pt education on HEP performance to reduce pain with proper technique   11/19/23: NuStep L1 x6' PT present to monitor symptoms Supine with pillow prop for head: assisted heel slides with strap x10 each LE Supine posterior pelvic tilt x10 PPT with small range LE march in hooklying x10 each LE Supine hip abduction x10 each LE - signif adductor stretch with hip abd Hip flexor stretch rocking in/out foot on 1st step - 2x10 each LE Seated yellow medicine ball - 3-way chest press x5, hip to shoulder x5 (some shoulder pain with this) Sit to stand from chair x5 Lap around gym holding 10lb kbell at side each UE Updated HEP   11/01/23: Discussed evaluation findings and POC Initiated HEP and gave DN handout                                                                                                            PATIENT EDUCATION:  Education details: [email or web address] Person educated: Patient and Spouse Education method: Explanation, Facilities manager, Verbal cues, and Handouts Education comprehension: verbalized understanding and returned demonstration  HOME EXERCISE PROGRAM: Access Code: [email or web address] URL: [email or web address] Date: 11/19/2023 Prepared by: Minor Amble Emanuel Dowson  Exercises - Supine Heel Slide  - 1 x daily - 7 x weekly - 3 sets - 10 reps - Supine Posterior Pelvic Tilt  - 1 x daily - 7 x weekly - 3 sets - 10 reps - Supine March with Posterior Pelvic Tilt  - 1 x daily - 7 x weekly - 3 sets - 10 reps - Supine Hip Abduction  - 1 x daily - 7 x weekly - 3 sets - 10  reps - Supine Butterfly Groin Stretch  - 2 x daily - 7 x weekly - 1 sets - 3 reps - 30 sec hold  ASSESSMENT:  CLINICAL IMPRESSION: Patient reports 50% improvement in Lt hip pain.  He will have brief episodes of sharp pain with quick unexpected movements or with getting into bed.  He feels the anterior groin pain with hip marching.  Getting in/out of car and walking are nearly painfree at this time.  He did report that last session flared up his shoulders so PT adjusted approach today.  OBJECTIVE IMPAIRMENTS: Abnormal gait, decreased ROM, increased muscle spasms, impaired flexibility, improper body mechanics, postural dysfunction, and pain.   ACTIVITY LIMITATIONS: squatting, bed mobility, and locomotion level  PARTICIPATION LIMITATIONS: cleaning, community activity, and yard work  PERSONAL FACTORS: 1 comorbidity: Hx of inguinal hernia repair are also affecting patient's functional outcome.   REHAB POTENTIAL: Excellent  CLINICAL DECISION MAKING: Stable/uncomplicated  EVALUATION COMPLEXITY: Moderate   GOALS: Goals reviewed with patient? Yes  SHORT TERM GOALS: Target date: 11/29/23  Pt will be ind with initial  HEP Baseline: Goal status: PARTIALLY MET 6/6, NEEDS IMPROVED COMPLIANCE  2.  Pt will report at least 50% reduction in pain with bed mobility Baseline:  Goal status: MET 6/6    LONG TERM GOALS: Target date: 12/27/23  Pt will be ind with advanced HEP Baseline:  Goal status: INITIAL  2.  Pt will improve LEFS to at least 70/80 to demo improved function Baseline: 63/80 Goal status: INITIAL  3.  Pt will report reduced pain in Lt groin by at least 75% with all activities Baseline:  Goal status: ONGOING, 50% 6/6  4.  Pt will learn how to manage abdominal pressures with proper body mechanics and breathing techniques with global functional exercise such as squats. Baseline:  Goal status: INITIAL    PLAN:  PT FREQUENCY: 1-2x/week  PT DURATION: 8 weeks  PLANNED INTERVENTIONS: 97110-Therapeutic exercises, 97530- Therapeutic activity, [id number]- Neuromuscular re-education, 97535- Self Care, [id number]- Manual therapy, [phone]- Gait training, [phone]- Aquatic Therapy, [phone]- Ionotophoresis 4mg /ml Dexamethasone , Patient/Family education, Balance training, Taping, Dry Needling, Joint mobilization, Spinal mobilization, Cryotherapy, and Moist heat.  PLAN FOR NEXT SESSION: Pt needs to be propped up in supine due to vertigo history, DN Lt adductors as needed, review and progress HEP as tol, bil hip ROM, teach proper core stabilization, monitor for possible re-injury of inguinal hernia? Was repaired in 2022   Candido Flott, PT 12/10/23 11:56 AM    PHYSICAL THERAPY DISCHARGE SUMMARY  Visits from Start of Care: 3  Current functional level related to goals / functional outcomes: Discharge completed due to patient's request   Patient agrees to discharge. Patient goals were met. Patient is being discharged due to being pleased with the current functional level. Penelope Bowie, PT 12/21/23 7:54 AM

## 2023-12-11 ENCOUNTER — Encounter: Payer: Self-pay | Admitting: Internal Medicine

## 2023-12-11 DIAGNOSIS — Z125 Encounter for screening for malignant neoplasm of prostate: Secondary | ICD-10-CM
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
## 2023-12-17 ENCOUNTER — Ambulatory Visit: Admitting: Physical Therapy

## 2023-12-21 ENCOUNTER — Ambulatory Visit (INDEPENDENT_AMBULATORY_CARE_PROVIDER_SITE_OTHER): Admitting: Internal Medicine

## 2023-12-21 ENCOUNTER — Ambulatory Visit: Payer: Self-pay | Admitting: Internal Medicine

## 2023-12-21 ENCOUNTER — Ambulatory Visit (INDEPENDENT_AMBULATORY_CARE_PROVIDER_SITE_OTHER)

## 2023-12-21 ENCOUNTER — Telehealth: Payer: Self-pay | Admitting: Cardiology

## 2023-12-21 ENCOUNTER — Encounter: Payer: Self-pay | Admitting: Internal Medicine

## 2023-12-21 VITALS — BP 110/68 | HR 77 | Temp 98.8°F | Ht 70.0 in | Wt 164.0 lb

## 2023-12-21 DIAGNOSIS — R062 Wheezing: Secondary | ICD-10-CM | POA: Diagnosis not present

## 2023-12-21 DIAGNOSIS — R058 Other specified cough: Secondary | ICD-10-CM

## 2023-12-21 DIAGNOSIS — I517 Cardiomegaly: Secondary | ICD-10-CM | POA: Diagnosis not present

## 2023-12-21 DIAGNOSIS — R0989 Other specified symptoms and signs involving the circulatory and respiratory systems: Secondary | ICD-10-CM | POA: Diagnosis not present

## 2023-12-21 DIAGNOSIS — I10 Essential (primary) hypertension: Secondary | ICD-10-CM

## 2023-12-21 DIAGNOSIS — R042 Hemoptysis: Secondary | ICD-10-CM | POA: Diagnosis not present

## 2023-12-21 DIAGNOSIS — J069 Acute upper respiratory infection, unspecified: Secondary | ICD-10-CM

## 2023-12-21 HISTORY — DX: Acute upper respiratory infection, unspecified: J06.9

## 2023-12-21 MED ORDER — ALBUTEROL SULFATE HFA 108 (90 BASE) MCG/ACT IN AERS: 2.0000 | INHALATION_SPRAY | Freq: Four times a day (QID) | RESPIRATORY_TRACT | 1 refills | Status: AC | PRN

## 2023-12-21 MED ORDER — METHYLPREDNISOLONE 4 MG PO TBPK: ORAL_TABLET | ORAL | 0 refills | Status: DC

## 2023-12-21 MED ORDER — LEVOFLOXACIN 500 MG PO TABS: 500.0000 mg | ORAL_TABLET | Freq: Every day | ORAL | 0 refills | Status: DC

## 2023-12-21 MED ORDER — HYDROCODONE BIT-HOMATROP MBR 5-1.5 MG/5ML PO SOLN: 5.0000 mL | Freq: Four times a day (QID) | ORAL | 0 refills | Status: AC | PRN

## 2023-12-21 NOTE — Patient Instructions (Signed)
 Please take all new medication as prescribed - the antibiotic, cough medicine, medrol  pack, and albuterol inhaler as needed  Please continue all other medications as before  Please have the pharmacy call with any other refills you may need.  Please keep your appointments with your specialists as you may have planned  Please go to the XRAY Department in the first floor for the x-ray testing  You will be contacted by phone if any changes need to be made immediately.  Otherwise, you will receive a letter about your results with an explanation, but please check with MyChart first.

## 2023-12-21 NOTE — Telephone Encounter (Signed)
 Pt c/o medication issue:  1. Name of Medication: HYDROcodone  bit-homatropine (HYCODAN) 5-1.5 MG/5ML syrup  methylPREDNISolone  (MEDROL  DOSEPAK) 4 MG TBPK tablet  levofloxacin (LEVAQUIN) 500 MG tablet   2. How are you currently taking this medication (dosage and times per day)? -  3. Are you having a reaction (difficulty breathing--STAT)? -  4. What is your medication issue? Pt wants to know if ok to take new meds due to being sick. Georgetta Kinnier it interfer with current meds

## 2023-12-21 NOTE — Progress Notes (Signed)
 Patient ID: Richard [name], male   DOB: Aug 19, 1945, 78 y.o.   MRN: [id number]        Chief Complaint: follow up prod cough, wheezing, htn,        HPI:  Richard [name] is a 78 y.o. male Here with acute onset mild to mod 2-3 days ST, HA, general weakness and malaise, with prod cough greenish sputum with trace blood, but Pt denies chest pain, orthopnea, PND, increased LE swelling, palpitations, dizziness or syncope but has had mild wheezing sob in the past day.   Pt denies polydipsia, polyuria, or new focal neuro s/s.    Pt denies fever, wt loss, night sweats, loss of appetite, or other constitutional symptoms         Wt Readings from Last 3 Encounters:  12/21/23 164 lb (74.4 kg)  10/20/23 156 lb (70.8 kg)  09/08/23 157 lb (71.2 kg)   BP Readings from Last 3 Encounters:  12/21/23 110/68  12/09/23 105/70  10/20/23 (!) 135/97         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    Atrial fibrillation (HCC)    Back pain    Dyspnea    history of covid 09/2018 and with extertion    Dysrhythmia    GERD (gastroesophageal reflux disease)    Headache    Hypertension    Pain    PAIN AND OA RT KNEE   Pneumonia    YEARS AGO   Psoriasis    arthritis   Snoring 04/27/2014   Past Surgical History:  Procedure Laterality Date   CATARACT EXTRACTION Bilateral    INGUINAL HERNIA REPAIR Left 01/29/2021   Procedure: LEFT INGUINAL HERNIA REPAIR WITH MESH;  Surgeon: Enid Harry, MD;  Location: [facility];  Service: General;  Laterality: Left;   KNEE ARTHROSCOPY Right 01/31/2014   Procedure: RIGHT ARTHROSCOPY KNEE WITH DEBRIDMENT CHONDROPLASTY;  Surgeon: Aurther Blue, MD;  Location: [facility];  Service: Orthopedics;  Laterality: Right;   LUMP REMOVED FROM LEFT BREAST Left 07/06/2006   STATES SURGERY  AT [facility] - LOCAL- NOT CANCER 5 OR MORE YRS AGO   MORTON'S NEUROMA REMOVED     TONSILLECTOMY     AS A CHILD   TRANSTHORACIC  ECHOCARDIOGRAM  07/2020   Normal LV size and function.  GR 1 DD.  Mild RV enlargement.  Moderate LA & RA enlargement.  Mild MR.  MR likely related to mildly for MR/MVP.  Moderate TR, mildly elevated RAP.Aaron Aas  Moderate ao valve thickening.  Mild AOV sclerosis but no stenosis.    reports that he quit smoking about 35 years ago. His smoking use included cigarettes. He has been exposed to tobacco smoke. He has never used smokeless tobacco. He reports current alcohol use of about 7.0 standard drinks of alcohol per week. He reports that he does not use drugs. family history includes Diabetes in his mother; Heart disease in his mother; Hypertension in his father and mother; Parkinson's disease in his brother; Stroke in his brother; Stroke (age of onset: 13) in his father. Allergies  Allergen Reactions   Azithromycin Diarrhea and Nausea And Vomiting   Latex     WHEN PT USED TO WEAR LATEX GLOVES AT WORK HE NOTICED SKIN REDNESS AND ITCHING OF HANDS.   Prednisone Other (See Comments)    makes me angry and I feel weird.   Current Outpatient Medications on File Prior to  Visit  Medication Sig Dispense Refill   CALCIUM -MAGNESIUM-ZINC PO Take 1 tablet by mouth 2 (two) times daily.      cholecalciferol (VITAMIN D ) 1000 UNITS tablet Take 1,000 Units by mouth daily.      clobetasol  cream (TEMOVATE ) 0.05 % Apply 1 Application topically 2 (two) times daily. To scaly areas on back, abdomen and left arm for 2 weeks then stop. Take a 2 week break before restarting. 60 g 2   Clobetasol  Propionate (TEMOVATE ) 0.05 % external spray Apply topically 2 (two) times daily. 59 mL 6   Cod Liver Oil CAPS Take 1 capsule by mouth every morning.     cyclobenzaprine  (FLEXERIL ) 10 MG tablet TAKE 1 TABLET BY MOUTH AT  BEDTIME 90 tablet 1   ELIQUIS  5 MG TABS tablet TAKE 1 TABLET BY MOUTH TWICE  DAILY 200 tablet 2   famotidine  (PEPCID ) 20 MG tablet TAKE 1 TABLET BY MOUTH DAILY 100 tablet 2   fluticasone  (FLONASE ) 50 MCG/ACT nasal spray  Place 2 sprays into both nostrils daily. 48 g 3   hydroxychloroquine  (PLAQUENIL ) 200 MG tablet Take 1 tablet (200 mg total) by mouth 2 (two) times daily. 180 tablet 0   lisinopril -hydrochlorothiazide  (ZESTORETIC ) 10-12.5 MG tablet TAKE 1 TABLET BY MOUTH IN THE  MORNING 100 tablet 2   Lutein-Zeaxanthin 25-5 MG CAPS Take 1 capsule by mouth every morning.     misoprostol  (CYTOTEC ) 100 MCG tablet TAKE 1 TABLET BY MOUTH TWICE  DAILY AS NEEDED 240 tablet 2   Multiple Vitamin (MULTIVITAMIN WITH MINERALS) TABS tablet Take 1 tablet by mouth daily.     rosuvastatin  (CRESTOR ) 40 MG tablet Take 1 tablet (40 mg total) by mouth daily. Please keep upcoming appointment on 7/9 with Dr.Crenshaw in order to receive future refills. Thank You. 90 tablet 0   triamcinolone  cream (KENALOG ) 0.1 % Apply 1 application topically daily. Apply to psoriasis rash (Patient taking differently: Apply 1 application  topically daily. Apply to psoriasis rash. Pt takes as needed.) 30 g 1   vitamin B-12 (CYANOCOBALAMIN ) 1000 MCG tablet Take 1,000 mcg by mouth daily.     No current facility-administered medications on file prior to visit.        ROS:  All others reviewed and negative.  Objective        PE:  BP 110/68 (BP Location: Left Arm, Patient Position: Sitting, Cuff Size: Normal)   Pulse 77   Temp 98.8 F (37.1 C) (Oral)   Ht 5' 10 (1.778 m)   Wt 164 lb (74.4 kg)   SpO2 98%   BMI 23.53 kg/m                 Constitutional: Pt appears mild ill               HENT: Head: NCAT.                Right Ear: External ear normal.                 Left Ear: External ear normal. Bilat tm's with mild erythema.  Max sinus areas no tender.  Pharynx with mild erythema, no exudate                 Eyes: . Pupils are equal, round, and reactive to light. Conjunctivae and EOM are normal       n        Nose: without d/c or deformity  Neck: Neck supple. Gross normal ROM               Cardiovascular: Normal rate and regular  rhythm.                 Pulmonary/Chest: Effort normal and breath sounds without rales but with few mild bilateral wheezing.                Neurological: Pt is alert. At baseline orientation, motor grossly intact               Skin: Skin is warm. No rashes, no other new lesions, LE edema - none               Psychiatric: Pt behavior is normal without agitation   Micro: none  Cardiac tracings I have personally interpreted today:  none  Pertinent Radiological findings (summarize): none   Lab Results  Component Value Date   WBC 6.5 09/01/2023   HGB 13.6 09/01/2023   HCT 42.0 09/01/2023   PLT 170.0 09/01/2023   GLUCOSE 93 09/01/2023   CHOL 138 09/01/2023   TRIG 34.0 09/01/2023   HDL 75.20 09/01/2023   LDLCALC 56 09/01/2023   ALT 36 09/01/2023   AST 31 09/01/2023   NA 140 09/01/2023   K 4.3 09/01/2023   CL 104 09/01/2023   CREATININE 0.83 09/01/2023   BUN 19 09/01/2023   CO2 29 09/01/2023   TSH 1.78 08/24/2022   HGBA1C 6.1 08/24/2022   MICROALBUR 4.3 (H) 08/24/2022   Assessment/Plan:  Richard [name] is a 78 y.o. White or Caucasian [1] male with  has a past medical history of Arthritis, Atrial fibrillation (HCC), Back pain, Dyspnea, Dysrhythmia, GERD (gastroesophageal reflux disease), Headache, Hypertension, Pain, Pneumonia, Psoriasis, and Snoring (04/27/2014).  Essential hypertension BP Readings from Last 3 Encounters:  12/21/23 110/68  12/09/23 105/70  10/20/23 (!) 135/97   Stable, pt to continue medical treatment zestoretic  10 12.5 qd   Productive cough Mild to mod, can't r/o pna, for cxr, levaquin 500 every day,  cough med prn, to f/u any worsening symptoms or concerns  Wheezing Mild to mod, for medrol  pack, inhaler prn,  to f/u any worsening symptoms or concerns  Followup: Return if symptoms worsen or fail to improve.  Rosalia Colonel, MD 12/22/2023 8:37 PM [facility]

## 2023-12-21 NOTE — Telephone Encounter (Signed)
 Spoke with patient and advised will forward to Pharm D and Dr Audery Blazing for review

## 2023-12-22 ENCOUNTER — Encounter: Payer: Self-pay | Admitting: Internal Medicine

## 2023-12-22 DIAGNOSIS — R058 Other specified cough: Secondary | ICD-10-CM | POA: Insufficient documentation

## 2023-12-22 DIAGNOSIS — R062 Wheezing: Secondary | ICD-10-CM | POA: Insufficient documentation

## 2023-12-22 NOTE — Assessment & Plan Note (Signed)
 Mild to mod, can't r/o pna, for cxr, levaquin 500 every day,  cough med prn, to f/u any worsening symptoms or concerns

## 2023-12-22 NOTE — Assessment & Plan Note (Signed)
 BP Readings from Last 3 Encounters:  12/21/23 110/68  12/09/23 105/70  10/20/23 (!) 135/97   Stable, pt to continue medical treatment zestoretic  10 12.5 qd

## 2023-12-22 NOTE — Assessment & Plan Note (Signed)
 Mild to mod, for medrol  pack, inhaler prn,  to f/u any worsening symptoms or concerns

## 2023-12-23 NOTE — Telephone Encounter (Signed)
 Left message for patient to call back

## 2023-12-23 NOTE — Telephone Encounter (Signed)
Routing back to triage.

## 2023-12-23 NOTE — Telephone Encounter (Signed)
 Informed patient of PharmD's advisement-  Concurrent use of APIXABAN  and methylprednisolone  may result in increased risk of gastrointestinal bleeding.  Recommend he monitor.    Concurrent use of LEVOFLOXACIN and famotidine  may result in increased risk of QT interval prolongation. Recommend he hold famotidine  until he completes course of levofloxacin.  Patient verbalized understanding.

## 2023-12-23 NOTE — Telephone Encounter (Signed)
 Concurrent use of APIXABAN  and methylprednisolone  may result in increased risk of gastrointestinal bleeding.  Recommend he monitor.   Concurrent use of LEVOFLOXACIN and famotidine  may result in increased risk of QT interval prolongation. Recommend he hold famotidine  until he completes course of levofloxacin

## 2023-12-24 ENCOUNTER — Encounter: Admitting: Physical Therapy

## 2023-12-24 ENCOUNTER — Encounter: Payer: Self-pay | Admitting: Rheumatology

## 2023-12-24 ENCOUNTER — Ambulatory Visit: Payer: Medicare Other | Attending: Rheumatology | Admitting: Rheumatology

## 2023-12-24 VITALS — BP 97/62 | HR 80 | Resp 16 | Ht 69.0 in | Wt 159.0 lb

## 2023-12-24 DIAGNOSIS — L409 Psoriasis, unspecified: Secondary | ICD-10-CM

## 2023-12-24 DIAGNOSIS — I10 Essential (primary) hypertension: Secondary | ICD-10-CM | POA: Diagnosis not present

## 2023-12-24 DIAGNOSIS — M545 Low back pain, unspecified: Secondary | ICD-10-CM | POA: Diagnosis not present

## 2023-12-24 DIAGNOSIS — M19012 Primary osteoarthritis, left shoulder: Secondary | ICD-10-CM

## 2023-12-24 DIAGNOSIS — M542 Cervicalgia: Secondary | ICD-10-CM

## 2023-12-24 DIAGNOSIS — Z79899 Other long term (current) drug therapy: Secondary | ICD-10-CM

## 2023-12-24 DIAGNOSIS — I83019 Varicose veins of right lower extremity with ulcer of unspecified site: Secondary | ICD-10-CM

## 2023-12-24 DIAGNOSIS — M79671 Pain in right foot: Secondary | ICD-10-CM

## 2023-12-24 DIAGNOSIS — M79641 Pain in right hand: Secondary | ICD-10-CM

## 2023-12-24 DIAGNOSIS — I4729 Other ventricular tachycardia: Secondary | ICD-10-CM

## 2023-12-24 DIAGNOSIS — M170 Bilateral primary osteoarthritis of knee: Secondary | ICD-10-CM | POA: Diagnosis not present

## 2023-12-24 DIAGNOSIS — L4050 Arthropathic psoriasis, unspecified: Secondary | ICD-10-CM

## 2023-12-24 DIAGNOSIS — G8929 Other chronic pain: Secondary | ICD-10-CM

## 2023-12-24 DIAGNOSIS — M79672 Pain in left foot: Secondary | ICD-10-CM

## 2023-12-24 DIAGNOSIS — I440 Atrioventricular block, first degree: Secondary | ICD-10-CM

## 2023-12-24 DIAGNOSIS — I341 Nonrheumatic mitral (valve) prolapse: Secondary | ICD-10-CM

## 2023-12-24 DIAGNOSIS — M7541 Impingement syndrome of right shoulder: Secondary | ICD-10-CM | POA: Diagnosis not present

## 2023-12-24 DIAGNOSIS — M24522 Contracture, left elbow: Secondary | ICD-10-CM

## 2023-12-24 DIAGNOSIS — K219 Gastro-esophageal reflux disease without esophagitis: Secondary | ICD-10-CM

## 2023-12-24 DIAGNOSIS — M24521 Contracture, right elbow: Secondary | ICD-10-CM | POA: Diagnosis not present

## 2023-12-24 DIAGNOSIS — M79642 Pain in left hand: Secondary | ICD-10-CM

## 2023-12-24 DIAGNOSIS — I480 Paroxysmal atrial fibrillation: Secondary | ICD-10-CM

## 2023-12-24 DIAGNOSIS — E782 Mixed hyperlipidemia: Secondary | ICD-10-CM

## 2023-12-24 DIAGNOSIS — L97919 Non-pressure chronic ulcer of unspecified part of right lower leg with unspecified severity: Secondary | ICD-10-CM

## 2023-12-24 NOTE — Patient Instructions (Signed)
 Standing Labs We placed an order today for your standing lab work.   Please have your standing labs drawn in November  Please have your labs drawn 2 weeks prior to your appointment so that the provider can discuss your lab results at your appointment, if possible.  Please note that you may see your imaging and lab results in MyChart before we have reviewed them. We will contact you once all results are reviewed. Please allow our office up to 72 hours to thoroughly review all of the results before contacting the office for clarification of your results.  WALK-IN LAB HOURS  Monday through Thursday from 8:00 am -12:30 pm and 1:00 pm-4:00 pm and Friday from 8:00 am-12:00 pm.  Patients with office visits requiring labs will be seen before walk-in labs.  You may encounter longer than normal wait times. Please allow additional time. Wait times may be shorter on  Monday and Thursday afternoons.  We do not book appointments for walk-in labs. We appreciate your patience and understanding with our staff.   Labs are drawn by [facility]. Please bring your co-pay at the time of your lab draw.  You may receive a bill from [facility] for your lab work.  Please note if you are on Hydroxychloroquine  and and an order has been placed for a Hydroxychloroquine  level,  you will need to have it drawn 4 hours or more after your last dose.  If you wish to have your labs drawn at another location, please call the office 24 hours in advance so we can fax the orders.  The office is located at [address], [address], [place], [place] [address]   If you have any questions regarding directions or hours of operation,  please call [phone].   As a reminder, please drink plenty of water prior to coming for your lab work. Thanks!  Vaccines You are taking a medication(s) that can suppress your immune system.  The following immunizations are recommended: Flu annually Covid-19  Td/Tdap (tetanus, diphtheria, pertussis) every  10 years Pneumonia (Prevnar 15 then Pneumovax 23 at least 1 year apart.  Alternatively, can take Prevnar 20 without needing additional dose) Shingrix: 2 doses from 4 weeks to 6 months apart  Please check with your PCP to make sure you are up to date.

## 2023-12-25 LAB — CBC WITH DIFFERENTIAL/PLATELET
Absolute Lymphocytes: 1078 {cells}/uL (ref 850–3900)
Absolute Monocytes: 655 {cells}/uL (ref 200–950)
Basophils Absolute: 39 {cells}/uL (ref 0–200)
Basophils Relative: 0.7 %
Eosinophils Absolute: 110 {cells}/uL (ref 15–500)
Eosinophils Relative: 2 %
HCT: 40.7 % (ref 38.5–50.0)
Hemoglobin: 12.7 g/dL — ABNORMAL LOW (ref 13.2–17.1)
MCH: 28.9 pg (ref 27.0–33.0)
MCHC: 31.2 g/dL — ABNORMAL LOW (ref 32.0–36.0)
MCV: 92.5 fL (ref 80.0–100.0)
MPV: 10.1 fL (ref 7.5–12.5)
Monocytes Relative: 11.9 %
Neutro Abs: 3619 {cells}/uL (ref 1500–7800)
Neutrophils Relative %: 65.8 %
Platelets: 186 10*3/uL (ref 140–400)
RBC: 4.4 10*6/uL (ref 4.20–5.80)
RDW: 13 % (ref 11.0–15.0)
Total Lymphocyte: 19.6 %
WBC: 5.5 10*3/uL (ref 3.8–10.8)

## 2023-12-25 LAB — COMPREHENSIVE METABOLIC PANEL WITH GFR
AG Ratio: 1.5 (calc) (ref 1.0–2.5)
ALT: 34 U/L (ref 9–46)
AST: 33 U/L (ref 10–35)
Albumin: 3.9 g/dL (ref 3.6–5.1)
Alkaline phosphatase (APISO): 85 U/L (ref 35–144)
BUN: 17 mg/dL (ref 7–25)
CO2: 28 mmol/L (ref 20–32)
Calcium: 9.3 mg/dL (ref 8.6–10.3)
Chloride: 106 mmol/L (ref 98–110)
Creat: 0.9 mg/dL (ref 0.70–1.28)
Globulin: 2.6 g/dL (ref 1.9–3.7)
Glucose, Bld: 106 mg/dL — ABNORMAL HIGH (ref 65–99)
Potassium: 3.9 mmol/L (ref 3.5–5.3)
Sodium: 142 mmol/L (ref 135–146)
Total Bilirubin: 0.9 mg/dL (ref 0.2–1.2)
Total Protein: 6.5 g/dL (ref 6.1–8.1)
eGFR: 89 mL/min/{1.73_m2} (ref >=60)

## 2023-12-26 ENCOUNTER — Ambulatory Visit: Payer: Self-pay | Admitting: Physician Assistant

## 2023-12-27 NOTE — Addendum Note (Signed)
 Addended by: ROLLENE NORRIS A on: 12/27/2023 11:41 AM   Modules accepted: Orders

## 2023-12-28 ENCOUNTER — Ambulatory Visit (INDEPENDENT_AMBULATORY_CARE_PROVIDER_SITE_OTHER): Admitting: Internal Medicine

## 2023-12-28 ENCOUNTER — Encounter: Payer: Self-pay | Admitting: Internal Medicine

## 2023-12-28 VITALS — BP 114/62 | HR 75 | Temp 98.0°F | Ht 69.0 in | Wt 164.0 lb

## 2023-12-28 DIAGNOSIS — Z125 Encounter for screening for malignant neoplasm of prostate: Secondary | ICD-10-CM | POA: Diagnosis not present

## 2023-12-28 DIAGNOSIS — L4050 Arthropathic psoriasis, unspecified: Secondary | ICD-10-CM | POA: Diagnosis not present

## 2023-12-28 DIAGNOSIS — R053 Chronic cough: Secondary | ICD-10-CM

## 2023-12-28 NOTE — Progress Notes (Unsigned)
   Subjective:   Patient ID: Richard [name], male    DOB: [birth year], 78 y.o.   MRN: [id number]  HPI The patient is a 78 YO man coming in for chronic joint pain and wants to discuss PSA. Denies urinary complaints. Still congested and coughing but less color to sputum and drainage. Still finishing medications including levaquin and prednisone.   Review of Systems  Constitutional:  Positive for activity change, appetite change and fatigue.  HENT:  Positive for congestion, postnasal drip and rhinorrhea.   Eyes: Negative.   Respiratory:  Positive for cough. Negative for chest tightness and shortness of breath.   Cardiovascular:  Negative for chest pain, palpitations and leg swelling.  Gastrointestinal:  Negative for abdominal distention, abdominal pain, constipation, diarrhea, nausea and vomiting.  Musculoskeletal:  Positive for arthralgias, myalgias and neck stiffness.  Skin: Negative.   Neurological: Negative.   Psychiatric/Behavioral: Negative.      Objective:  Physical Exam Constitutional:      Appearance: He is well-developed.  HENT:     Head: Normocephalic and atraumatic.     Comments: Oropharynx with redness and clear drainage, nose with swollen turbinates, TMs normal bilaterally.  Neck:     Thyroid : No thyromegaly.   Cardiovascular:     Rate and Rhythm: Normal rate and regular rhythm.  Pulmonary:     Effort: Pulmonary effort is normal. No respiratory distress.     Breath sounds: Normal breath sounds. No wheezing or rales.  Abdominal:     General: Bowel sounds are normal. There is no distension.     Palpations: Abdomen is soft.     Tenderness: There is no abdominal tenderness. There is no rebound.   Musculoskeletal:        General: Tenderness present.     Cervical back: Normal range of motion.  Lymphadenopathy:     Cervical: No cervical adenopathy.   Skin:    General: Skin is warm and dry.   Neurological:     Mental Status: He is alert and oriented to person,  place, and time.     Coordination: Coordination normal.     Vitals:   12/28/23 1308  BP: 114/62  Pulse: 75  Temp: 98 F (36.7 C)  TempSrc: Oral  SpO2: 98%  Weight: 164 lb (74.4 kg)  Height: 5' 9 (1.753 m)    Assessment & Plan:

## 2023-12-29 DIAGNOSIS — Z125 Encounter for screening for malignant neoplasm of prostate: Secondary | ICD-10-CM | POA: Insufficient documentation

## 2023-12-29 NOTE — Assessment & Plan Note (Signed)
 This does impact function and pain with stiffness and deformity. He is seeing rheumatology and stable on plaquenil .

## 2023-12-29 NOTE — Assessment & Plan Note (Signed)
 Advised that CXR was clear. Okay to finish antibiotics and steroids. Talked about typical course for cough being up another 2-4 weeks after finishing medications. He is improving gradually overall.

## 2023-12-29 NOTE — Assessment & Plan Note (Signed)
 We spent time counseling about the risk and benefit of prostate cancer screening including risk of additional invasive procedures which may or may not find a prostate cancer which is meaningful to find. PSA is ordered and he will consider this information.

## 2023-12-31 ENCOUNTER — Inpatient Hospital Stay: Admitting: Internal Medicine

## 2024-01-04 NOTE — Progress Notes (Signed)
 HPI: FU atrial fibrillation.  Holter monitor February 2021 showed sinus rhythm with PACs, couplets, bigeminy, atrial run and bradycardia during early a.m. hours. Since having Covid March 2020 he has had some fatigue with activities as well as dyspnea with more vigorous activities relieved with rest.  Abdominal ultrasound August 2021 showed no aneurysm. Patient previously found to be in recurrent atrial fibrillation and there was discussion about rate control versus rhythm control.  He preferred rate control. Echocardiogram repeated May 2024 and showed ejection fraction 60 to 65%, mild left ventricular hypertrophy, moderate left atrial enlargement, severe right atrial enlargement, mild mitral regurgitation, mildly dilated ascending aorta.  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  Patient has been seen in the emergency room multiple times with complaints of headache after hitting his head on his birdcage or other trauma.  CT showed no bleed.  Since last seen patient denies dyspnea, chest pain, palpitations, syncope or bleeding.  Current Outpatient Medications  Medication Sig Dispense Refill   albuterol  (VENTOLIN  HFA) 108 (90 Base) MCG/ACT inhaler Inhale 2 puffs into the lungs every 6 (six) hours as needed for wheezing or shortness of breath. 8 g 1   CALCIUM -MAGNESIUM-ZINC PO Take 1 tablet by mouth 2 (two) times daily.      cholecalciferol (VITAMIN D ) 1000 UNITS tablet Take 1,000 Units by mouth daily.      clobetasol  cream (TEMOVATE ) 0.05 % Apply 1 Application topically 2 (two) times daily. To scaly areas on back, abdomen and left arm for 2 weeks then stop. Take a 2 week break before restarting. 60 g 2   Clobetasol  Propionate (TEMOVATE ) 0.05 % external spray Apply topically 2 (two) times daily. 59 mL 6   Cod Liver Oil CAPS Take 1 capsule by mouth every morning.     cyclobenzaprine  (FLEXERIL ) 10 MG tablet TAKE 1 TABLET BY MOUTH AT   BEDTIME 90 tablet 1   ELIQUIS  5 MG TABS tablet TAKE 1 TABLET BY MOUTH TWICE  DAILY 200 tablet 2   famotidine  (PEPCID ) 20 MG tablet TAKE 1 TABLET BY MOUTH DAILY 100 tablet 2   fluticasone  (FLONASE ) 50 MCG/ACT nasal spray Place 2 sprays into both nostrils daily. 48 g 3   hydroxychloroquine  (PLAQUENIL ) 200 MG tablet Take 1 tablet (200 mg total) by mouth 2 (two) times daily. 180 tablet 0   levofloxacin  (LEVAQUIN ) 500 MG tablet Take 1 tablet (500 mg total) by mouth daily. 10 tablet 0   lisinopril -hydrochlorothiazide  (ZESTORETIC ) 10-12.5 MG tablet TAKE 1 TABLET BY MOUTH IN THE  MORNING 100 tablet 2   Lutein-Zeaxanthin 25-5 MG CAPS Take 1 capsule by mouth every morning.     misoprostol  (CYTOTEC ) 100 MCG tablet TAKE 1 TABLET BY MOUTH TWICE  DAILY AS NEEDED 240 tablet 2   Multiple Vitamin (MULTIVITAMIN WITH MINERALS) TABS tablet Take 1 tablet by mouth daily.     rosuvastatin  (CRESTOR ) 40 MG tablet Take 1 tablet (40 mg total) by mouth daily. Please keep upcoming appointment on 7/9 with Dr.Hillery Zachman in order to receive future refills. Thank You. 90 tablet 0   triamcinolone  cream (KENALOG ) 0.1 % Apply 1 application topically daily. Apply to psoriasis rash 30 g 1   vitamin B-12 (CYANOCOBALAMIN ) 1000 MCG tablet Take 1,000 mcg by mouth daily.     methylPREDNISolone  (MEDROL  DOSEPAK) 4 MG TBPK tablet 4 tab by mouth x 3 days, 2 tab x 3 days, 1 tab x 3 days (Patient not  taking: Reported on 01/12/2024) 21 tablet 0   No current facility-administered medications for this visit.     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    Atrial fibrillation (HCC)    Back pain    Dyspnea    history of covid 09/2018 and with extertion    Dysrhythmia    GERD (gastroesophageal reflux disease)    Headache    Hypertension    Pain    PAIN AND OA RT KNEE   Pneumonia    YEARS AGO   Psoriasis    arthritis   Snoring 04/27/2014   Upper respiratory infection 12/21/2023     Past Surgical History:  Procedure Laterality Date   CATARACT EXTRACTION Bilateral    INGUINAL HERNIA REPAIR Left 01/29/2021   Procedure: LEFT INGUINAL HERNIA REPAIR WITH MESH;  Surgeon: Ebbie Cough, MD;  Location: [facility];  Service: General;  Laterality: Left;   KNEE ARTHROSCOPY Right 01/31/2014   Procedure: RIGHT ARTHROSCOPY KNEE WITH DEBRIDMENT CHONDROPLASTY;  Surgeon: Dempsey Melodi GAILS, MD;  Location: [facility];  Service: Orthopedics;  Laterality: Right;   LUMP REMOVED FROM LEFT BREAST Left 07/06/2006   STATES SURGERY  AT [facility] - LOCAL- NOT CANCER 5 OR MORE YRS AGO   MORTON'S NEUROMA REMOVED     TONSILLECTOMY     AS A CHILD   TRANSTHORACIC ECHOCARDIOGRAM  07/2020   Normal LV size and function.  GR 1 DD.  Mild RV enlargement.  Moderate LA & RA enlargement.  Mild MR.  MR likely related to mildly for MR/MVP.  Moderate TR, mildly elevated RAP.SABRA  Moderate ao valve thickening.  Mild AOV sclerosis but no stenosis.    Social History   Socioeconomic History   Marital status: Married    Spouse name: Not on file   Number of children: 0   Years of education: Not on file   Highest education level: Bachelor's degree (e.g., BA, AB, BS)  Occupational History   Not on file  Tobacco Use   Smoking status: Former    Current packs/day: 0.00    Types: Cigarettes    Quit date: 1990    Years since quitting: 35.5    Passive exposure: Past   Smokeless tobacco: Never  Vaping Use   Vaping status: Never Used  Substance and Sexual Activity   Alcohol use: Yes    Alcohol/week: 7.0 standard drinks of alcohol    Types: 7 Glasses of wine per week    Comment: 1 glass of wine with dinner   Drug use: No   Sexual activity: Yes  Other Topics Concern   Not on file  Social History Narrative   Right handed, caffeine 2-3 cups daily, married, no kids, has baby bird.  WC back injury, so not working at this time.  BS in econmics.   Social Drivers of Health   Financial Resource Strain:  Patient Declined (06/08/2023)   Overall Financial Resource Strain (CARDIA)    Difficulty of Paying Living Expenses: Patient declined  Food Insecurity: Patient Declined (06/08/2023)   Hunger Vital Sign    Worried About Running Out of Food in the Last Year: Patient declined    Ran Out of Food in the Last Year: Patient declined  Transportation Needs: Patient Declined (06/08/2023)   PRAPARE - Administrator, Civil Service (Medical): Patient declined    Lack of Transportation (Non-Medical): Patient declined  Physical Activity:  Not on file  Stress: Not on file  Social Connections: Unknown (06/08/2023)   Social Connection and Isolation Panel    Frequency of Communication with Friends and Family: Patient declined    Frequency of Social Gatherings with Friends and Family: Patient declined    Attends Religious Services: Patient declined    Database administrator or Organizations: No    Attends Engineer, structural: Not on file    Marital Status: Patient declined  Catering manager Violence: Not on file    Family History  Problem Relation Age of Onset   Diabetes Mother    Hypertension Mother    Heart disease Mother        has a stent    Stroke Father 3   Hypertension Father    Stroke Brother    Parkinson's disease Brother     ROS: no fevers or chills, productive cough, hemoptysis, dysphasia, odynophagia, melena, hematochezia, dysuria, hematuria, rash, seizure activity, orthopnea, PND, pedal edema, claudication. Remaining systems are negative.  Physical Exam: Well-developed well-nourished in no acute distress.  Skin is warm and dry.  HEENT is normal.  Neck is supple.  Chest is clear to auscultation with normal expansion.  Cardiovascular exam is irregular Abdominal exam nontender or distended. No masses palpated. Extremities show no edema. neuro grossly intact  EKG Interpretation Date/Time:  Wednesday January 12 2024 10:21:33 EDT Ventricular Rate:  79 PR  Interval:    QRS Duration:  100 QT Interval:  334 QTC Calculation: 382 R Axis:   68  Text Interpretation: Atrial fibrillation Nonspecific T wave abnormality Confirmed by Pietro Rogue ([id number]) on 01/12/2024 10:25:31 AM    A/P  1 atrial fibrillation-patient has had no further accidents after we discussed Watchman device and he still prefers conservative measures.  Will continue apixaban .  2 hypertension-patient's blood pressure is running low.  Discontinue lisinopril  HCT and instead treat with lisinopril  2.5 mg daily.  Follow blood pressure and adjust medications as needed.  3 hyperlipidemia-continue statin.  4 coronary calcification-patient denies chest pain.  Continue statin.  No aspirin given need for apixaban .  5 history of mitral valve prolapse-mild mitral regurgitation on most recent echo.  Rogue Pietro, MD

## 2024-01-05 ENCOUNTER — Telehealth: Payer: Self-pay | Admitting: Internal Medicine

## 2024-01-05 NOTE — Telephone Encounter (Signed)
 Patient on apixaban  for AF. Received a page today from the patient. He accidentally took two tablets (10mg  total) at night. No signs of bleeding right now. Kidney function normal. Instructed patient that he does not need to go to the ER. Gave patient anticipatory guidance. No further workup or intervention required at this time.

## 2024-01-09 ENCOUNTER — Other Ambulatory Visit: Payer: Self-pay | Admitting: Rheumatology

## 2024-01-12 ENCOUNTER — Encounter: Payer: Self-pay | Admitting: Cardiology

## 2024-01-12 ENCOUNTER — Ambulatory Visit: Attending: Cardiovascular Disease | Admitting: Cardiology

## 2024-01-12 VITALS — BP 94/60 | HR 79 | Ht 69.0 in | Wt 163.0 lb

## 2024-01-12 DIAGNOSIS — I341 Nonrheumatic mitral (valve) prolapse: Secondary | ICD-10-CM

## 2024-01-12 DIAGNOSIS — I4821 Permanent atrial fibrillation: Secondary | ICD-10-CM

## 2024-01-12 DIAGNOSIS — E7800 Pure hypercholesterolemia, unspecified: Secondary | ICD-10-CM

## 2024-01-12 DIAGNOSIS — I10 Essential (primary) hypertension: Secondary | ICD-10-CM

## 2024-01-12 DIAGNOSIS — I2510 Atherosclerotic heart disease of native coronary artery without angina pectoris: Secondary | ICD-10-CM

## 2024-01-12 MED ORDER — LISINOPRIL 2.5 MG PO TABS: 2.5000 mg | ORAL_TABLET | Freq: Every day | ORAL | 1 refills | Status: DC

## 2024-01-12 MED ORDER — LISINOPRIL 2.5 MG PO TABS: 2.5000 mg | ORAL_TABLET | Freq: Every day | ORAL | 3 refills | Status: DC

## 2024-01-12 NOTE — Addendum Note (Signed)
 Addended by: MELIDA ROLIN HERO on: 01/12/2024 10:57 AM   Modules accepted: Orders

## 2024-01-12 NOTE — Patient Instructions (Addendum)
 Medication Instructions:  Your physician has recommended you make the following change in your medication:  STOP: Lisinopril -Hydrochlorothiazide   START: Lisinopril  2.5 mg once daily  *If you need a refill on your cardiac medications before your next appointment, please call your pharmacy* Follow-Up: At [facility], you and your health needs are our priority.  As part of our continuing mission to provide you with exceptional heart care, our providers are all part of one team.  This team includes your primary Cardiologist (physician) and Advanced Practice Providers or APPs (Physician Assistants and Nurse Practitioners) who all work together to provide you with the care you need, when you need it.  Your next appointment:   6 months  Provider:   Redell Shallow, MD    Other instructions:  Please take your blood pressure daily for 2 weeks and send in a MyChart message. Please include heart rates/pulse. (One message at the end of the 2 weeks).   HOW TO TAKE YOUR BLOOD PRESSURE: Rest 5 minutes before taking your blood pressure. Don't smoke or drink caffeinated beverages for at least 30 minutes before. Take your blood pressure before (not after) you eat. Sit comfortably with your back supported and both feet on the floor (don't cross your legs). Elevate your arm to heart level on a table or a desk. Use the proper sized cuff. It should fit smoothly and snugly around your bare upper arm. There should be enough room to slip a fingertip under the cuff. The bottom edge of the cuff should be 1 inch above the crease of the elbow. Ideally, take 3 measurements at one sitting and record the average.

## 2024-01-17 ENCOUNTER — Telehealth: Payer: Self-pay | Admitting: Cardiology

## 2024-01-17 ENCOUNTER — Other Ambulatory Visit: Payer: Self-pay

## 2024-01-17 ENCOUNTER — Encounter (HOSPITAL_BASED_OUTPATIENT_CLINIC_OR_DEPARTMENT_OTHER): Payer: Self-pay | Admitting: *Deleted

## 2024-01-17 ENCOUNTER — Emergency Department (HOSPITAL_BASED_OUTPATIENT_CLINIC_OR_DEPARTMENT_OTHER)
Admission: EM | Admit: 2024-01-17 | Discharge: 2024-01-17 | Disposition: A | Discharge status: Home / Self Care | Attending: Emergency Medicine | Admitting: Emergency Medicine

## 2024-01-17 ENCOUNTER — Emergency Department (HOSPITAL_BASED_OUTPATIENT_CLINIC_OR_DEPARTMENT_OTHER)

## 2024-01-17 DIAGNOSIS — W228XXA Striking against or struck by other objects, initial encounter: Secondary | ICD-10-CM | POA: Insufficient documentation

## 2024-01-17 DIAGNOSIS — S0990XA Unspecified injury of head, initial encounter: Secondary | ICD-10-CM | POA: Insufficient documentation

## 2024-01-17 DIAGNOSIS — Z91040 Latex allergy status: Secondary | ICD-10-CM | POA: Diagnosis not present

## 2024-01-17 DIAGNOSIS — M4802 Spinal stenosis, cervical region: Secondary | ICD-10-CM | POA: Diagnosis not present

## 2024-01-17 DIAGNOSIS — M5030 Other cervical disc degeneration, unspecified cervical region: Secondary | ICD-10-CM | POA: Diagnosis not present

## 2024-01-17 DIAGNOSIS — Z7901 Long term (current) use of anticoagulants: Secondary | ICD-10-CM | POA: Insufficient documentation

## 2024-01-17 DIAGNOSIS — I10 Essential (primary) hypertension: Secondary | ICD-10-CM | POA: Diagnosis not present

## 2024-01-17 DIAGNOSIS — Z79899 Other long term (current) drug therapy: Secondary | ICD-10-CM | POA: Insufficient documentation

## 2024-01-17 DIAGNOSIS — S199XXA Unspecified injury of neck, initial encounter: Secondary | ICD-10-CM | POA: Diagnosis not present

## 2024-01-17 NOTE — Discharge Instructions (Addendum)
 CT Cervical Spine: Degenerative disc and facet disease.  No acute bony abnormality -- these are normal and expected age related changes for someone in their mid 57s.  CT Head: No acute intracranial abnormality.   Your blood pressure and other vital signs have been totally normal in the emergency department. Please use Tylenol  for pain.  You may use 1000 mg of Tylenol  every 6 hours.  Not to exceed 4 g of Tylenol  within 24 hours.  I expect that any lingering soreness and headache should resolve within the next few days.  Please return if you have any new numbness, vision changes, confusion, weakness, or significantly worsening headache.

## 2024-01-17 NOTE — Telephone Encounter (Signed)
 Patient calling in because he hit his head, calling to see if he should go to the ER. As well I making medication change. Please advise

## 2024-01-17 NOTE — Telephone Encounter (Signed)
 Spoke with Pts wife per DPR. Pt hit his head while changing a light bulb in the pantry.  Hit the back of his head on the wall, pt did not fall. Pt has had a headache since hitting his head and they wanted to know if they should go to ED. Advised to go to ED or nearest urgent care to have his head looked at. Pts wife requested I also forward this to Dr Pietro. Stated I would.

## 2024-01-17 NOTE — ED Triage Notes (Addendum)
 Patient to ED reporting he hit the back of his head on a door 2 days ago. No loss of consciousness and no bleeding from injury. No bruising or swelling noted in triage.    Patient currently on eliquis  and has had an intermittent headache. Cardiologist told patient to go to the ED.

## 2024-01-17 NOTE — ED Provider Notes (Signed)
 [facility] AT [facility] Provider Note   CSN: [id number] Arrival date & time: 01/17/24  1238     Patient presents with: Head Injury   Richard [name] is a 78 y.o. male with medical history significant for hypertension, mitral valve prolapse, GERD, who is taking a blood thinner who presents with concern for hitting his head on the back of the door very hard around 2 days ago.  No loss of consciousness, no bleeding.  He reports some intermittent lingering headache around 2/10.  His cardiologist told him that he should come get evaluated given his blood thinner.  He reports that the day after the incident he had slightly elevated blood pressure around 150/100, otherwise his blood pressure has been normal.    Head Injury      Prior to Admission medications   Medication Sig Start Date End Date Taking? Authorizing Provider  albuterol  (VENTOLIN  HFA) 108 (90 Base) MCG/ACT inhaler Inhale 2 puffs into the lungs every 6 (six) hours as needed for wheezing or shortness of breath. 12/21/23   Norleen Lynwood ORN, MD  CALCIUM -MAGNESIUM-ZINC PO Take 1 tablet by mouth 2 (two) times daily.     [provider]  cholecalciferol (VITAMIN D ) 1000 UNITS tablet Take 1,000 Units by mouth daily.     [provider]  clobetasol  cream (TEMOVATE ) 0.05 % Apply 1 Application topically 2 (two) times daily. To scaly areas on back, abdomen and left arm for 2 weeks then stop. Take a 2 week break before restarting. 06/07/23   Alm Delon SAILOR, DO  Clobetasol  Propionate (TEMOVATE ) 0.05 % external spray Apply topically 2 (two) times daily. 10/27/21   Livingston Rigg, MD  Cod Liver Oil CAPS Take 1 capsule by mouth every morning.    [provider]  cyclobenzaprine  (FLEXERIL ) 10 MG tablet TAKE 1 TABLET BY MOUTH AT  BEDTIME 08/25/23   Rollene Almarie LABOR, MD  ELIQUIS  5 MG TABS tablet TAKE 1 TABLET BY MOUTH TWICE  DAILY 08/25/23   Rollene Almarie LABOR, MD  famotidine  (PEPCID ) 20  MG tablet TAKE 1 TABLET BY MOUTH DAILY 02/18/23   Rollene Almarie LABOR, MD  fluticasone  (FLONASE ) 50 MCG/ACT nasal spray Place 2 sprays into both nostrils daily. 03/02/23   Rollene Almarie LABOR, MD  hydroxychloroquine  (PLAQUENIL ) 200 MG tablet Take 1 tablet (200 mg total) by mouth 2 (two) times daily. 11/15/23   Dolphus Reiter, MD  levofloxacin  (LEVAQUIN ) 500 MG tablet Take 1 tablet (500 mg total) by mouth daily. 12/21/23   Norleen Lynwood ORN, MD  lisinopril  (ZESTRIL ) 2.5 MG tablet Take 1 tablet (2.5 mg total) by mouth daily. 01/12/24 04/11/24  Pietro Redell RAMAN, MD  lisinopril  (ZESTRIL ) 2.5 MG tablet Take 1 tablet (2.5 mg total) by mouth daily. 01/12/24 04/11/24  Pietro Redell RAMAN, MD  Lutein-Zeaxanthin 25-5 MG CAPS Take 1 capsule by mouth every morning.    [provider]  methylPREDNISolone  (MEDROL  DOSEPAK) 4 MG TBPK tablet 4 tab by mouth x 3 days, 2 tab x 3 days, 1 tab x 3 days Patient not taking: Reported on 01/12/2024 12/21/23   Norleen Lynwood ORN, MD  misoprostol  (CYTOTEC ) 100 MCG tablet TAKE 1 TABLET BY MOUTH TWICE  DAILY AS NEEDED 04/01/23   Rollene Almarie LABOR, MD  Multiple Vitamin (MULTIVITAMIN WITH MINERALS) TABS tablet Take 1 tablet by mouth daily.    [provider]  rosuvastatin  (CRESTOR ) 40 MG tablet Take 1 tablet (40 mg total) by mouth daily. Please keep upcoming appointment on 7/9  with Dr.Crenshaw in order to receive future refills. Thank You. 12/01/23   Pietro Redell RAMAN, MD  triamcinolone  cream (KENALOG ) 0.1 % Apply 1 application topically daily. Apply to psoriasis rash 06/20/19   Gershon Donnice SAUNDERS, DPM  vitamin B-12 (CYANOCOBALAMIN ) 1000 MCG tablet Take 1,000 mcg by mouth daily.    [provider]    Allergies: Azithromycin, Latex, and Prednisone    Review of Systems  All other systems reviewed and are negative.   Updated Vital Signs BP 123/83 (BP Location: Right Arm)   Pulse 81   Temp 98.6 F (37 C)   Resp 16   SpO2 99%   Physical Exam Vitals and nursing  note reviewed.  Constitutional:      General: He is not in acute distress.    Appearance: Normal appearance.  HENT:     Head: Normocephalic and atraumatic.  Eyes:     General:        Right eye: No discharge.        Left eye: No discharge.  Cardiovascular:     Rate and Rhythm: Normal rate and regular rhythm.  Pulmonary:     Effort: Pulmonary effort is normal. No respiratory distress.  Musculoskeletal:        General: No deformity.     Comments: No visible hematoma, or other injury noted to the posterior scalp.  No significant tenderness palpation of cervical paraspinous muscles.  Skin:    General: Skin is warm and dry.  Neurological:     Mental Status: He is alert and oriented to person, place, and time.     Comments: Moves all 4 limbs spontaneously, CN II through XII grossly intact, can ambulate without difficulty, intact sensation throughout.   Psychiatric:        Mood and Affect: Mood normal.        Behavior: Behavior normal.     (all labs ordered are listed, but only abnormal results are displayed) Labs Reviewed - No data to display  EKG: None  Radiology: CT Cervical Spine Wo Contrast Result Date: 01/17/2024 CLINICAL DATA:  Neck trauma (Age >= 65y).  Hit head on door. EXAM: CT CERVICAL SPINE WITHOUT CONTRAST TECHNIQUE: Multidetector CT imaging of the cervical spine was performed without intravenous contrast. Multiplanar CT image reconstructions were also generated. RADIATION DOSE REDUCTION: This exam was performed according to the departmental dose-optimization program which includes automated exposure control, adjustment of the mA and/or kV according to patient size and/or use of iterative reconstruction technique. COMPARISON:  10/10/2022 FINDINGS: Alignment: Normal Skull base and vertebrae: No acute fracture. No primary bone lesion or focal pathologic process. Soft tissues and spinal canal: No prevertebral fluid or swelling. No visible canal hematoma. Disc levels: Diffuse  degenerative disc disease with disc space narrowing and anterior spurring. Bilateral degenerative facet disease. Multilevel bilateral neural foraminal narrowing. Upper chest: No acute findings Other: None IMPRESSION: Degenerative disc and facet disease.  No acute bony abnormality Electronically Signed   By: Franky Crease M.D.   On: 01/17/2024 14:03   CT Head Wo Contrast Result Date: 01/17/2024 CLINICAL DATA:  Head trauma, minor (Age >= 65y).  Hit head on door. EXAM: CT HEAD WITHOUT CONTRAST TECHNIQUE: Contiguous axial images were obtained from the base of the skull through the vertex without intravenous contrast. RADIATION DOSE REDUCTION: This exam was performed according to the departmental dose-optimization program which includes automated exposure control, adjustment of the mA and/or kV according to patient size and/or use of iterative reconstruction technique. COMPARISON:  12/09/2023 FINDINGS: Brain: No acute intracranial abnormality. Specifically, no hemorrhage, hydrocephalus, mass lesion, acute infarction, or significant intracranial injury. Vascular: No hyperdense vessel or unexpected calcification. Skull: No acute calvarial abnormality. Sinuses/Orbits: No acute findings Other: None IMPRESSION: No acute intracranial abnormality. Electronically Signed   By: Franky Crease M.D.   On: 01/17/2024 14:02     Procedures   Medications Ordered in the ED - No data to display                                  Medical Decision Making Amount and/or Complexity of Data Reviewed Radiology: ordered.   This patient is a 78 y.o. male who presents to the ED for concern of head injury.   Differential diagnoses prior to evaluation: epidural hematoma, subdural hematoma, skull fracture, subarachnoid hemorrhage, unstable cervical spine fracture, concussion vs other MSK injury  Past Medical History / Social History / Additional history: Chart reviewed. Pertinent results include: hypertension, mitral valve  prolapse, GERD, who is taking a blood thinner  Physical Exam: Physical exam performed. The pertinent findings include:  Moves all 4 limbs spontaneously, CN II through XII grossly intact, can ambulate without difficulty, intact sensation throughout.  No visible hematoma, or other injury noted to the posterior scalp.  No significant tenderness palpation of cervical paraspinous muscles.   I independently interpreted imaging including CT head wo contrast, CT spine wo contrast which shows no intracranial abnormality, degenerative disease and facet disease in C spine without any acute changes. I agree with the radiologist interpretation.  Medications / Treatment: Tylenol  as needed   Disposition: After consideration of the diagnostic results and the patients response to treatment, I feel that patient stable for discharge with plan as above .   emergency department workup does not suggest an emergent condition requiring admission or immediate intervention beyond what has been performed at this time. The plan is: as above. The patient is safe for discharge and has been instructed to return immediately for worsening symptoms, change in symptoms or any other concerns.   Final diagnoses:  Injury of head, initial encounter    ED Discharge Orders     None          Rosan Sherlean DEL, PA-C 01/17/24 1447    Franklyn Sid SAILOR, MD 01/18/24 1752

## 2024-01-18 ENCOUNTER — Ambulatory Visit: Payer: Medicare Other | Admitting: Dermatology

## 2024-01-18 ENCOUNTER — Encounter: Payer: Self-pay | Admitting: Dermatology

## 2024-01-18 VITALS — BP 98/73

## 2024-01-18 DIAGNOSIS — R238 Other skin changes: Secondary | ICD-10-CM | POA: Diagnosis not present

## 2024-01-18 DIAGNOSIS — L570 Actinic keratosis: Secondary | ICD-10-CM

## 2024-01-18 DIAGNOSIS — L409 Psoriasis, unspecified: Secondary | ICD-10-CM

## 2024-01-18 MED ORDER — ZORYVE 0.3 % EX CREA: TOPICAL_CREAM | CUTANEOUS | 2 refills | Status: AC

## 2024-01-18 NOTE — Progress Notes (Signed)
 Follow-Up Visit   Subjective  Richard [name] is a 78 y.o. male who presents for FOLLOW UP:  Patient was last evaluated on 07/19/23.   Psoriasis: Prescribed Clobetasol  0.05% cream - BID 2 weeks on 2 weeks off. Recommended Aveeno moisturizer. Patient reports sxs are better and areas are resolving.  Add on: Wart on R hard that he would like evaluated   The following portions of the chart were reviewed this encounter and updated as appropriate: medications, allergies, medical history  Review of Systems:  No other skin or systemic complaints except as noted in HPI or Assessment and Plan.  Objective  Well appearing patient in no apparent distress; mood and affect are within normal limits.  A full examination was performed including scalp, head, eyes, ears, nose, lips, neck, chest, axillae, abdomen, back, buttocks, bilateral upper extremities, bilateral lower extremities, hands, feet, fingers, toes, fingernails, and toenails. All findings within normal limits unless otherwise noted below.   Relevant exam findings are noted in the Assessment and Plan.         Mid Forehead (2) Erythematous thin papules/macules with gritty scale.   Assessment & Plan   PSORIASIS Exam: Improved, Well-demarcated erythematous papules/plaques with silvery scale, guttate pink scaly papules. 10% BSA.  - Assessment: Patient reports improvement with clobetasol  treatment, but admits to inconsistent application. Current regimen of clobetasol  2 weeks on, 2 weeks off is appropriate to avoid skin thinning. Patient's medication (unspecified) requires caution with sun exposure.  - Plan:    Transition to Zoryve  (topical non-steroid) for daily use     - Once daily for 8 weeks, then 2-3 times weekly for maintenance     - Samples provided    [facility] to handle prescription and contact patient    Continue current clobetasol  regimen (2 weeks on, 2 weeks off) until Zoryve  is initiated    Patient educated on  importance of consistent application  2. Unspecified Papule/Nodule - Assessment: Patient presents with a longstanding, non-painful nodule. Differential diagnosis includes cyst and wart  - Plan:    Cryotherapy performed on nodule at pt's request    Patient informed of uncertain efficacy of treatment  3. Actinic keratoses - Assessment: Patient reports tiny rough patches on scalp, previously treated with cryotherapy. These lesions are concerning for dysplastic cells with potential to develop into squamous cell carcinoma over time.  - Plan:    Cryotherapy performed on affected areas of scalp    Apply Aquaphor to treated areas daily    Patient educated on potential progression to squamous cell carcinoma PSORIASIS   PAPULE Right Hand - Posterior Destruction of lesion - Right Hand - Posterior Complexity: simple   Destruction method: cryotherapy   Informed consent: discussed and consent obtained   Timeout:  patient name, date of birth, surgical site, and procedure verified Lesion destroyed using liquid nitrogen: Yes   Post-procedure details: wound care instructions given    AK (ACTINIC KERATOSIS) (2) Mid Forehead (2) Destruction of lesion - Mid Forehead (2) Complexity: simple   Destruction method: cryotherapy   Informed consent: discussed and consent obtained   Timeout:  patient name, date of birth, surgical site, and procedure verified Lesion destroyed using liquid nitrogen: Yes   Region frozen until ice ball extended beyond lesion: Yes   Outcome: patient tolerated procedure well with no complications   Post-procedure details: wound care instructions given     No follow-ups on file.   Documentation: I have reviewed the above documentation for accuracy and completeness, and  I agree with the above.  I, Shirron Maranda, CMA, am acting as scribe for [employer], DO.   Delon Lenis, DO

## 2024-01-18 NOTE — Patient Instructions (Addendum)
 Date: Tue Jan 18 2024  Dear Richard [name],  Thank you for visiting today. Here is a summary of the key instructions:  - Medications:   - Use Zoryve  (non-steroid topical) once a day for 8 weeks, then 2-3 times a week for maintenance   - Use Aquaphor on frozen spots daily  - Treatments:   - Cryotherapy (freezing) performed on cyst and small spots on head  - Pharmacy:   - [facility] will call you about Zoryve  prescription  - Reminders:   - Use clobetasol  2 weeks on, 2 weeks off to avoid skin thinning   - Be careful with sun exposure due to medication  - Follow-up:   - Message us  on MyChart if you have any issues  Please reach out if you have any questions or concerns.  Best Regards,  Dr. Delon Lenis D[facility]at[facility]     Cryotherapy Aftercare  Wash gently with soap and water everyday.   Apply Vaseline and Band-Aid daily until healed.   Important Information  Due to recent changes in healthcare laws, you may see results of your pathology and/or laboratory studies on MyChart before the doctors have had a chance to review them. We understand that in some cases there may be results that are confusing or concerning to you. Please understand that not all results are received at the same time and often the doctors may need to interpret multiple results in order to provide you with the best plan of care or course of treatment. Therefore, we ask that you please give us  2 business days to thoroughly review all your results before contacting the office for clarification. Should we see a critical lab result, you will be contacted sooner.   If You Need Anything After Your Visit  If you have any questions or concerns for your doctor, please call our main line at [phone] If no one answers, please leave a voicemail as directed and we will return your call as soon as possible. Messages left after 4 pm will be answered the following business day.   You may also send us  a message via  MyChart. We typically respond to MyChart messages within 1-2 business days.  For prescription refills, please ask your pharmacy to contact our office. Our fax number is [phone].  If you have an urgent issue when the clinic is closed that cannot wait until the next business day, you can page your doctor at the number below.    Please note that while we do our best to be available for urgent issues outside of office hours, we are not available 24/7.   If you have an urgent issue and are unable to reach us , you may choose to seek medical care at your doctor's office, retail clinic, urgent care center, or emergency room.  If you have a medical emergency, please immediately call 911 or go to the emergency department. In the event of inclement weather, please call our main line at [phone] for an update on the status of any delays or closures.  Dermatology Medication Tips: Please keep the boxes that topical medications come in in order to help keep track of the instructions about where and how to use these. Pharmacies typically print the medication instructions only on the boxes and not directly on the medication tubes.   If your medication is too expensive, please contact our office at [phone] or send us  a message through MyChart.   We are unable to tell what your co-pay for medications will  be in advance as this is different depending on your insurance coverage. However, we may be able to find a substitute medication at lower cost or fill out paperwork to get insurance to cover a needed medication.   If a prior authorization is required to get your medication covered by your insurance company, please allow us  1-2 business days to complete this process.  Drug prices often vary depending on where the prescription is filled and some pharmacies may offer cheaper prices.  The website www.goodrx.com contains coupons for medications through different pharmacies. The prices here do not  account for what the cost may be with help from insurance (it may be cheaper with your insurance), but the website can give you the price if you did not use any insurance.  - You can print the associated coupon and take it with your prescription to the pharmacy.  - You may also stop by our office during regular business hours and pick up a GoodRx coupon card.  - If you need your prescription sent electronically to a different pharmacy, notify our office through [facility] or by phone at [phone]

## 2024-01-19 ENCOUNTER — Encounter: Payer: Self-pay | Admitting: Dermatology

## 2024-01-21 ENCOUNTER — Encounter: Payer: Self-pay | Admitting: Cardiology

## 2024-01-21 DIAGNOSIS — I10 Essential (primary) hypertension: Secondary | ICD-10-CM

## 2024-01-23 ENCOUNTER — Other Ambulatory Visit: Payer: Self-pay | Admitting: Rheumatology

## 2024-01-24 ENCOUNTER — Other Ambulatory Visit: Payer: Self-pay | Admitting: *Deleted

## 2024-01-24 DIAGNOSIS — I10 Essential (primary) hypertension: Secondary | ICD-10-CM

## 2024-01-24 MED ORDER — HYDROCHLOROTHIAZIDE 12.5 MG PO CAPS: 12.5000 mg | ORAL_CAPSULE | Freq: Every day | ORAL | 6 refills | Status: DC

## 2024-01-24 NOTE — Telephone Encounter (Signed)
 Pietro Redell RAMAN, MD to Richie Adrien ORN, RN     01/22/24 10:33 AM Ok to resume hydrochlorothiazide  12. 5 mg daily and continue lisinopril  2.5 mg daily; bmet one week Redell Pietro

## 2024-01-24 NOTE — Telephone Encounter (Signed)
 Patient has been seen.

## 2024-01-24 NOTE — Telephone Encounter (Signed)
 Last Fill: 11/15/2023  Eye exam: 08/05/2023 WNL    Labs: 12/24/2023 CBC stable. Glucose is 106. Rest of CMP WNL  Next Visit: 06/16/2024  Last Visit: 12/24/2023  DX: Psoriatic arthritis   Current Dose per office note 12/24/2023: Plaquenil  200 twice daily   Okay to refill Plaquenil ?

## 2024-02-22 ENCOUNTER — Other Ambulatory Visit: Payer: Self-pay | Admitting: Internal Medicine

## 2024-02-22 ENCOUNTER — Other Ambulatory Visit: Payer: Self-pay | Admitting: Cardiology

## 2024-02-22 ENCOUNTER — Other Ambulatory Visit: Payer: Self-pay | Admitting: Rheumatology

## 2024-02-22 DIAGNOSIS — E7800 Pure hypercholesterolemia, unspecified: Secondary | ICD-10-CM

## 2024-02-25 ENCOUNTER — Ambulatory Visit: Admitting: Podiatry

## 2024-02-28 ENCOUNTER — Ambulatory Visit (INDEPENDENT_AMBULATORY_CARE_PROVIDER_SITE_OTHER): Admitting: Internal Medicine

## 2024-02-28 ENCOUNTER — Encounter: Payer: Self-pay | Admitting: Internal Medicine

## 2024-02-28 ENCOUNTER — Ambulatory Visit (INDEPENDENT_AMBULATORY_CARE_PROVIDER_SITE_OTHER)

## 2024-02-28 VITALS — BP 90/58 | HR 66 | Temp 98.0°F | Resp 20 | Ht 69.5 in | Wt 163.8 lb

## 2024-02-28 DIAGNOSIS — M79671 Pain in right foot: Secondary | ICD-10-CM | POA: Diagnosis not present

## 2024-02-28 DIAGNOSIS — M7731 Calcaneal spur, right foot: Secondary | ICD-10-CM | POA: Diagnosis not present

## 2024-02-28 DIAGNOSIS — M2041 Other hammer toe(s) (acquired), right foot: Secondary | ICD-10-CM | POA: Diagnosis not present

## 2024-02-28 DIAGNOSIS — M79674 Pain in right toe(s): Secondary | ICD-10-CM | POA: Diagnosis not present

## 2024-02-28 DIAGNOSIS — I10 Essential (primary) hypertension: Secondary | ICD-10-CM | POA: Diagnosis not present

## 2024-02-28 NOTE — Patient Instructions (Signed)
 We will get you in with the foot doctor to help shave the callusing.  We will check the x-ray of the foot.

## 2024-02-28 NOTE — Progress Notes (Signed)
 Subjective:   Patient ID: Richard [name], male    DOB: [birth year], 78 y.o.   MRN: [id number]  Discussed the use of AI scribe software for clinical note transcription with the patient, who gave verbal consent to proceed.  History of Present Illness Richard [name] is a 78 year old male who presents with right toe pain and swelling.  He has been experiencing swelling and pain in his right toe for an unspecified duration. The pain intensifies when walking, particularly on hard surfaces, and is alleviated somewhat by using a silicone toe cap and walking on carpeted surfaces. The pain worsens by the afternoon, and the toe appears red. He is unsure of any specific injury but recalls possibly hitting his toe at some point. No recent significant trauma to the toe, but he acknowledges occasional toe stubbing. No numbness, but there is a tingling sensation and tenderness at the tip of the toe.  He mentions a history of blood pressure fluctuations. Recently, his blood pressure was recorded as low at 90/58 mmHg, which is unusual for him as he typically measures in the 120s/80s range. He notes occasional lightheadedness, particularly in the evenings. His medication regimen includes a reduced dose of lisinopril  at 2.5 mg, and he has been reintroduced to a diuretic after experiencing swelling and elevated blood pressure when it was previously discontinued. No lightheadedness or dizziness during the day, but occasional lightheadedness in the evening.  Review of Systems  Constitutional: Negative.   HENT: Negative.    Eyes: Negative.   Respiratory:  Negative for cough, chest tightness and shortness of breath.   Cardiovascular:  Negative for chest pain, palpitations and leg swelling.  Gastrointestinal:  Negative for abdominal distention, abdominal pain, constipation, diarrhea, nausea and vomiting.  Musculoskeletal:  Positive for arthralgias.  Skin: Negative.   Neurological: Negative.   Psychiatric/Behavioral:  Negative.      Objective:  Physical Exam Constitutional:      Appearance: He is well-developed.  HENT:     Head: Normocephalic and atraumatic.  Cardiovascular:     Rate and Rhythm: Normal rate and regular rhythm.  Pulmonary:     Effort: Pulmonary effort is normal. No respiratory distress.     Breath sounds: Normal breath sounds. No wheezing or rales.  Abdominal:     General: Bowel sounds are normal. There is no distension.     Palpations: Abdomen is soft.     Tenderness: There is no abdominal tenderness. There is no rebound.  Musculoskeletal:     Cervical back: Normal range of motion.     Comments: Right 3rd toe with callusing at the tip of the toe some swelling  Skin:    General: Skin is warm and dry.  Neurological:     Mental Status: He is alert and oriented to person, place, and time.     Coordination: Coordination normal.     Vitals:   02/28/24 1116  BP: (!) 90/58  Pulse: 66  Temp: 98 F (36.7 C)  TempSrc: Oral  SpO2: 99%  Weight: 163 lb 12.8 oz (74.3 kg)  Height: 5' 9.5 (1.765 m)    Assessment and Plan Assessment & Plan Callus (corn) of right toe with associated pain   The right third toe is swollen and painful with a significant callus, likely causing the pain. A stress fracture is considered in the differential diagnosis. Order an x-ray of the right toe to rule out a stress fracture. Refer to a podiatrist for evaluation and treatment of the callus.  Low blood pressure (hypotension)   Blood pressure was recorded at 90/58 mmHg with occasional lightheadedness. The current reading is 112/60 mmHg, which is acceptable. Reassess the medication regimen if symptoms persist.

## 2024-02-28 NOTE — Assessment & Plan Note (Signed)
 The right third toe is swollen and painful with a significant callus, likely causing the pain. A stress fracture is considered in the differential diagnosis. Order an x-ray of the right toe to rule out a stress fracture. Refer to a podiatrist for evaluation and treatment of the callus.

## 2024-02-29 ENCOUNTER — Ambulatory Visit: Payer: Self-pay | Admitting: Cardiology

## 2024-02-29 LAB — BASIC METABOLIC PANEL WITH GFR
BUN/Creatinine Ratio: 16 (ref 10–24)
BUN: 14 mg/dL (ref 8–27)
CO2: 19 mmol/L — ABNORMAL LOW (ref 20–29)
Calcium: 9.3 mg/dL (ref 8.6–10.2)
Chloride: 104 mmol/L (ref 96–106)
Creatinine, Ser: 0.88 mg/dL (ref 0.76–1.27)
Glucose: 88 mg/dL (ref 70–99)
Potassium: 4.4 mmol/L (ref 3.5–5.2)
Sodium: 142 mmol/L (ref 134–144)
eGFR: 90 mL/min/1.73 (ref >59)

## 2024-03-02 ENCOUNTER — Ambulatory Visit (HOSPITAL_BASED_OUTPATIENT_CLINIC_OR_DEPARTMENT_OTHER): Admitting: Orthopaedic Surgery

## 2024-03-02 ENCOUNTER — Other Ambulatory Visit (HOSPITAL_BASED_OUTPATIENT_CLINIC_OR_DEPARTMENT_OTHER): Payer: Self-pay

## 2024-03-02 DIAGNOSIS — M25512 Pain in left shoulder: Secondary | ICD-10-CM

## 2024-03-02 DIAGNOSIS — G8929 Other chronic pain: Secondary | ICD-10-CM | POA: Diagnosis not present

## 2024-03-02 DIAGNOSIS — M25511 Pain in right shoulder: Secondary | ICD-10-CM

## 2024-03-02 MED ORDER — LIDOCAINE HCL 1 % IJ SOLN
4.0000 mL | INTRAMUSCULAR | Status: AC | PRN
  Administered 2024-03-02: 4 mL

## 2024-03-02 MED ORDER — TRIAMCINOLONE ACETONIDE 40 MG/ML IJ SUSP
80.0000 mg | INTRAMUSCULAR | Status: AC | PRN
  Administered 2024-03-02: 80 mg via INTRA_ARTICULAR

## 2024-03-02 MED ORDER — TRIAMCINOLONE ACETONIDE 40 MG/ML IJ SUSP
80.0000 mg | INTRAMUSCULAR | Status: AC | PRN
Start: 2024-03-02 — End: 2024-03-02
  Administered 2024-03-02: 80 mg via INTRA_ARTICULAR

## 2024-03-02 NOTE — Progress Notes (Signed)
 Chief Complaint: Bilateral shoulder pain     History of Present Illness:   03/02/2024: Presents today for follow-up of his bilateral shoulders.  He is requesting injections bilaterally  Richard [name] is a 78 y.o. male presents today with bilateral shoulder pain.  He has been experiencing this for many years.  He has had multiple injections into both the right and left shoulder in the past.  He has participated in physical therapy for strengthening in the past of both shoulders without any relief.  He continues to have persistent pain with overhead range of motion.  He is right-hand dominant.  He does enjoy being very active with his wife.  He has on anticoagulation from his heart perspective.  He is having a very difficult time laying directly on the side as well.  He is a back sleeper at baseline    PMH/PSH/Family History/Social History/Meds/Allergies:    Past Medical History:  Diagnosis Date  . 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 Atrial fibrillation (HCC)   . Back pain   . Dyspnea    history of covid 09/2018 and with extertion   . Dysrhythmia   . GERD (gastroesophageal reflux disease)   . Headache   . Hypertension   . Pain    PAIN AND OA RT KNEE  . Pneumonia    YEARS AGO  . Psoriasis    arthritis  . Snoring 04/27/2014  . Upper respiratory infection 12/21/2023   Past Surgical History:  Procedure Laterality Date  . CATARACT EXTRACTION Bilateral   . INGUINAL HERNIA REPAIR Left 01/29/2021   Procedure: LEFT INGUINAL HERNIA REPAIR WITH MESH;  Surgeon: Ebbie Cough, MD;  Location: [facility];  Service: General;  Laterality: Left;  . KNEE ARTHROSCOPY Right 01/31/2014   Procedure: RIGHT ARTHROSCOPY KNEE WITH DEBRIDMENT CHONDROPLASTY;  Surgeon: Dempsey Melodi GAILS, MD;  Location: [facility];  Service: Orthopedics;  Laterality: Right;  . LUMP REMOVED FROM LEFT BREAST Left 07/06/2006   STATES SURGERY  AT [facility] - LOCAL- NOT CANCER 5 OR MORE YRS  AGO  . MORTON'S NEUROMA REMOVED    . TONSILLECTOMY     AS A CHILD  . TRANSTHORACIC ECHOCARDIOGRAM  07/2020   Normal LV size and function.  GR 1 DD.  Mild RV enlargement.  Moderate LA & RA enlargement.  Mild MR.  MR likely related to mildly for MR/MVP.  Moderate TR, mildly elevated RAP.SABRA  Moderate ao valve thickening.  Mild AOV sclerosis but no stenosis.   Social History   Socioeconomic History  . Marital status: Married    Spouse name: Not on file  . Number of children: 0  . Years of education: Not on file  . Highest education level: Bachelor's degree (e.g., BA, AB, BS)  Occupational History  . Not on file  Tobacco Use  . Smoking status: Former    Current packs/day: 0.00    Types: Cigarettes    Quit date: 1990    Years since quitting: 35.6    Passive exposure: Past  . Smokeless tobacco: Never  Vaping Use  . Vaping status: Never Used  Substance and Sexual Activity  . Alcohol use: Yes    Alcohol/week: 7.0 standard drinks of alcohol    Types: 7 Glasses of wine per week    Comment: 1 glass of wine with dinner  . Drug use: No  . Sexual activity: Yes  Other Topics Concern  . Not on file  Social History Narrative   Right handed, caffeine 2-3 cups daily, married, no kids, has baby bird.  WC back injury, so not working at this time.  BS in econmics.   Social Drivers of Health   Financial Resource Strain: Patient Declined (06/08/2023)   Overall Financial Resource Strain (CARDIA)   . Difficulty of Paying Living Expenses: Patient declined  Food Insecurity: Patient Declined (06/08/2023)   Hunger Vital Sign   . Worried About Programme researcher, broadcasting/film/video in the Last Year: Patient declined   . Ran Out of Food in the Last Year: Patient declined  Transportation Needs: Patient Declined (06/08/2023)   PRAPARE - Transportation   . Lack of Transportation (Medical): Patient declined   . Lack of Transportation (Non-Medical): Patient declined  Physical Activity: Not on file  Stress: Not on file   Social Connections: Unknown (06/08/2023)   Social Connection and Isolation Panel   . Frequency of Communication with Friends and Family: Patient declined   . Frequency of Social Gatherings with Friends and Family: Patient declined   . Attends Religious Services: Patient declined   . Active Member of Clubs or Organizations: No   . Attends Banker Meetings: Not on file   . Marital Status: Patient declined   Family History  Problem Relation Age of Onset  . Diabetes Mother   . Hypertension Mother   . Heart disease Mother        has a stent   . Stroke Father 28  . Hypertension Father   . Stroke Brother   . Parkinson's disease Brother    Allergies  Allergen Reactions  . Azithromycin Diarrhea and Nausea And Vomiting  . Latex     WHEN PT USED TO WEAR LATEX GLOVES AT WORK HE NOTICED SKIN REDNESS AND ITCHING OF HANDS.  SABRA Prednisone Other (See Comments)    makes me angry and I feel weird.   Current Outpatient Medications  Medication Sig Dispense Refill  . albuterol  (VENTOLIN  HFA) 108 (90 Base) MCG/ACT inhaler Inhale 2 puffs into the lungs every 6 (six) hours as needed for wheezing or shortness of breath. 8 g 1  . CALCIUM -MAGNESIUM-ZINC PO Take 1 tablet by mouth 2 (two) times daily.     . cholecalciferol (VITAMIN D ) 1000 UNITS tablet Take 1,000 Units by mouth daily.     . clobetasol  cream (TEMOVATE ) 0.05 % Apply 1 Application topically 2 (two) times daily. To scaly areas on back, abdomen and left arm for 2 weeks then stop. Take a 2 week break before restarting. 60 g 2  . Clobetasol  Propionate (TEMOVATE ) 0.05 % external spray Apply topically 2 (two) times daily. 59 mL 6  . Cod Liver Oil CAPS Take 1 capsule by mouth every morning.    . cyclobenzaprine  (FLEXERIL ) 10 MG tablet TAKE 1 TABLET BY MOUTH AT  BEDTIME 90 tablet 1  . ELIQUIS  5 MG TABS tablet TAKE 1 TABLET BY MOUTH TWICE  DAILY 200 tablet 2  . famotidine  (PEPCID ) 20 MG tablet TAKE 1 TABLET BY MOUTH DAILY 100 tablet 1  .  fluticasone  (FLONASE ) 50 MCG/ACT nasal spray Place 2 sprays into both nostrils daily. 48 g 3  . hydrochlorothiazide  (MICROZIDE ) 12.5 MG capsule Take 1 capsule (12.5 mg total) by mouth daily. 30 capsule 6  . hydroxychloroquine  (PLAQUENIL ) 200 MG tablet TAKE 1 TABLET BY MOUTH TWICE  DAILY 180 tablet 0  . levofloxacin  (LEVAQUIN ) 500 MG tablet Take 1 tablet (500 mg total) by mouth daily. 10 tablet 0  .  lisinopril  (ZESTRIL ) 2.5 MG tablet Take 1 tablet (2.5 mg total) by mouth daily. 90 tablet 3  . Lutein-Zeaxanthin 25-5 MG CAPS Take 1 capsule by mouth every morning.    . misoprostol  (CYTOTEC ) 100 MCG tablet TAKE 1 TABLET BY MOUTH TWICE  DAILY AS NEEDED 240 tablet 2  . Multiple Vitamin (MULTIVITAMIN WITH MINERALS) TABS tablet Take 1 tablet by mouth daily.    . Roflumilast  (ZORYVE ) 0.3 % CREA Apply to affected areas of the body BID. 60 g 2  . rosuvastatin  (CRESTOR ) 40 MG tablet TAKE 1 TABLET BY MOUTH DAILY 100 tablet 3  . triamcinolone  cream (KENALOG ) 0.1 % Apply 1 application topically daily. Apply to psoriasis rash 30 g 1  . vitamin B-12 (CYANOCOBALAMIN ) 1000 MCG tablet Take 1,000 mcg by mouth daily.     No current facility-administered medications for this visit.   No results found.  Review of Systems:   A ROS was performed including pertinent positives and negatives as documented in the HPI.  Physical Exam :   Constitutional: NAD and appears stated age Neurological: Alert and oriented Psych: Appropriate affect and cooperative There were no vitals taken for this visit.   Comprehensive Musculoskeletal Exam:    Forward elevation of both shoulders is to 110 degrees.  External rotation is to 15 degrees bilaterally.  Internal rotation is to side bilaterally.  There is weakness with forward elevation and 4 out of 5 strength bilaterally   Imaging:   Xray (3 views right shoulder, 3 views left shoulder): Evidence of rotator cuff arthropathy bilaterally with degenerative findings about the  glenohumeral joint bilaterally    I personally reviewed and interpreted the radiographs.   Assessment and Plan:   78 y.o. 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  At today's visit I did discuss that he has trialed both injections as well as therapy in the past.  At this time he has very limited overhead range of motion with significant pain.  Given this we did discuss possibility of surgical intervention.  I do believe he would ultimately benefit from reverse shoulder arthroplasty on both sides.  That being said he would like to pursue the right side first as this is a dominant side.  I discussed the risk and limitations.  I did discuss all of the associated recovery timeframe.  He would like to proceed with this  In the meantime he would like to proceed with bilateral shoulder injections which were provided after verbal consent was obtained    Procedure Note  Patient: Richard [name]             Date of Birth: 25-Jul-1945           MRN: [id number]             Visit Date: 03/02/2024  Procedures: Visit Diagnoses:  1. Chronic left shoulder pain   2. Chronic right shoulder pain     Large Joint Inj: R glenohumeral on 03/02/2024 10:26 AM Indications: pain Details: 22 G 1.5 in needle, ultrasound-guided anterior approach  Arthrogram: No  Medications: 4 mL lidocaine  1 %; 80 mg triamcinolone  acetonide 40 MG/ML Outcome: tolerated well, no immediate complications Procedure, treatment alternatives, risks and benefits explained, specific risks discussed. Consent was given by the patient. Immediately prior to procedure a time out was called to verify the correct patient, procedure, equipment, support staff and site/side marked as required. Patient was prepped and draped in the usual sterile fashion.    Large Joint Inj: L glenohumeral on 03/02/2024  10:26 AM Indications: pain Details: 22 G 1.5 in needle, ultrasound-guided anterior approach  Arthrogram: No  Medications: 4 mL lidocaine  1 %;  80 mg triamcinolone  acetonide 40 MG/ML Outcome: tolerated well, no immediate complications Procedure, treatment alternatives, risks and benefits explained, specific risks discussed. Consent was given by the patient. Immediately prior to procedure a time out was called to verify the correct patient, procedure, equipment, support staff and site/side marked as required. Patient was prepped and draped in the usual sterile fashion.        -Plan for right shoulder reverse shoulder arthroplasty   After a lengthy discussion of treatment options, including risks, benefits, alternatives, complications of surgical and nonsurgical conservative options, the patient elected surgical repair.   The patient  is aware of the material risks  and complications including, but not limited to injury to adjacent structures, neurovascular injury, infection, numbness, bleeding, implant failure, thermal burns, stiffness, persistent pain, failure to heal, disease transmission from allograft, need for further surgery, dislocation, anesthetic risks, blood clots, risks of death,and others. The probabilities of surgical success and failure discussed with patient given their particular co-morbidities.The time and nature of expected rehabilitation and recovery was discussed.The patient's questions were all answered preoperatively.  No barriers to understanding were noted. I explained the natural history of the disease process and Rx rationale.  I explained to the patient what I considered to be reasonable expectations given their personal situation.  The final treatment plan was arrived at through a shared patient decision making process model.    I personally saw and evaluated the patient, and participated in the management and treatment plan.  Elspeth Parker, MD Attending Physician, Orthopedic Surgery  This document was dictated using Dragon voice recognition software. A reasonable attempt at proof reading has been made to  minimize errors.

## 2024-03-10 ENCOUNTER — Ambulatory Visit: Payer: Self-pay | Admitting: Internal Medicine

## 2024-03-13 NOTE — Progress Notes (Signed)
 Patient was able to review results via my chart there were no questions or concerns

## 2024-03-27 ENCOUNTER — Other Ambulatory Visit: Payer: Self-pay | Admitting: Rheumatology

## 2024-03-28 NOTE — Telephone Encounter (Signed)
 Last Fill: 01/24/2024  Eye exam: 08/05/2023   Labs: 02/28/2024 BMP: CO2 19 12/24/2023 CBC stable.  Glucose is 106. Rest of CMP WNL  Next Visit: 06/16/2024  Last Visit: 12/24/2023  IK:Ednmpjupr arthritis   Current Dose per office note on 12/24/2023: Plaquenil  200 twice daily   Okay to refill Plaquenil ?

## 2024-03-31 ENCOUNTER — Ambulatory Visit: Admitting: Podiatry

## 2024-03-31 ENCOUNTER — Ambulatory Visit (INDEPENDENT_AMBULATORY_CARE_PROVIDER_SITE_OTHER)

## 2024-03-31 VITALS — Ht 69.0 in | Wt 163.8 lb

## 2024-03-31 DIAGNOSIS — L539 Erythematous condition, unspecified: Secondary | ICD-10-CM

## 2024-03-31 DIAGNOSIS — Z7901 Long term (current) use of anticoagulants: Secondary | ICD-10-CM | POA: Diagnosis not present

## 2024-03-31 DIAGNOSIS — M79675 Pain in left toe(s): Secondary | ICD-10-CM

## 2024-03-31 DIAGNOSIS — B351 Tinea unguium: Secondary | ICD-10-CM

## 2024-03-31 DIAGNOSIS — M2041 Other hammer toe(s) (acquired), right foot: Secondary | ICD-10-CM

## 2024-03-31 DIAGNOSIS — M79674 Pain in right toe(s): Secondary | ICD-10-CM | POA: Diagnosis not present

## 2024-03-31 MED ORDER — CEPHALEXIN 500 MG PO CAPS: 500.0000 mg | ORAL_CAPSULE | Freq: Three times a day (TID) | ORAL | 0 refills | Status: DC

## 2024-03-31 NOTE — Progress Notes (Signed)
 Subjective: Chief Complaint  Patient presents with   Hammer Toe    Rm 13 Patient is here for hammertoe of the right foot and callus on the right 3rd toe.    78 y.o. returns the office today for painful, elongated, thickened toenails which he cannot trim himself. Denies any redness or drainage around the nails.  No open lesions.    He also gets pain in the right third toe is developed with thick callus which is causing discomfort.  He has some swelling to the toe and concern for redness as well.  No drainage or pus.  No injuries.  PCP: Rollene Almarie LABOR, MD Last seen 09/08/2023  He is on Eliquis  for A-fib  Objective: AAO 3, NAD DP/PT pulses palpable, CRT less than 3 seconds Venous insuffiencey noted Nails hypertrophic, dystrophic, elongated, brittle, discolored 10. There is tenderness overlying the nails 1-5 bilaterally. There is no surrounding erythema or drainage along the nail sites.  Hammertoes present, rigid noted.  This right second toe does seem to overlap the third digit.  Thick hyperkeratotic tissue with some minimal dried blood at the distal aspect of the right third toe.  There are some localized edema and faint erythema which I think is more from inflammation as opposed to infection as there is no drainage or pus or ascending cellulitis.  There is no malodor. No pain with calf compression, swelling, warmth, erythema.  Assessment: Patient presents with symptomatic onychomycosis, on Eliquis ; hammertoe deformity  Plan: Symptom onychosis, on Eliquis  -Nails sharply debrided 10 without complication/bleeding. -Discussed daily foot inspection. If there are any changes, to call the office immediately.   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  Debrided without any complications or bleeding.  Dispensed offloading.  Prescribe cephalexin  given  some localized edema and faint erythema for concern for early infection.  If this is not resolved with a round of antibiotic will need to immediately or sooner if there is any worsening.  Return in about 3 months (around 06/30/2024).  Donnice JONELLE Fees DPM

## 2024-03-31 NOTE — Patient Instructions (Signed)
 Monitor for any signs/symptoms of infection. Call the office immediately if any occur or go directly to the emergency room. Call with any questions/concerns.

## 2024-04-28 ENCOUNTER — Emergency Department (HOSPITAL_BASED_OUTPATIENT_CLINIC_OR_DEPARTMENT_OTHER)
Admission: EM | Admit: 2024-04-28 | Discharge: 2024-04-28 | Disposition: A | Discharge status: Home / Self Care | Attending: Emergency Medicine | Admitting: Emergency Medicine

## 2024-04-28 ENCOUNTER — Encounter (HOSPITAL_BASED_OUTPATIENT_CLINIC_OR_DEPARTMENT_OTHER): Payer: Self-pay | Admitting: Emergency Medicine

## 2024-04-28 ENCOUNTER — Emergency Department (HOSPITAL_BASED_OUTPATIENT_CLINIC_OR_DEPARTMENT_OTHER)

## 2024-04-28 ENCOUNTER — Telehealth: Payer: Self-pay | Admitting: Cardiology

## 2024-04-28 DIAGNOSIS — Z7901 Long term (current) use of anticoagulants: Secondary | ICD-10-CM | POA: Insufficient documentation

## 2024-04-28 DIAGNOSIS — S0990XA Unspecified injury of head, initial encounter: Secondary | ICD-10-CM | POA: Diagnosis not present

## 2024-04-28 DIAGNOSIS — S0083XA Contusion of other part of head, initial encounter: Secondary | ICD-10-CM | POA: Insufficient documentation

## 2024-04-28 DIAGNOSIS — W228XXA Striking against or struck by other objects, initial encounter: Secondary | ICD-10-CM | POA: Insufficient documentation

## 2024-04-28 DIAGNOSIS — Z91040 Latex allergy status: Secondary | ICD-10-CM | POA: Insufficient documentation

## 2024-04-28 NOTE — ED Notes (Signed)
 Reviewed AVS/discharge instruction with patient. Time allotted for and all questions answered. Patient is agreeable for d/c and escorted to ed exit by staff.

## 2024-04-28 NOTE — ED Provider Notes (Signed)
 [place] EMERGENCY DEPARTMENT AT [facility] Provider Note   CSN: [id number] Arrival date & time: 04/28/24  1650     Patient presents with: Head Injury   Richard [name] is a 78 y.o. male.   Patient here after hitting his head on the trunk of his car about 5 hours ago.  Did not lose consciousness but he is on blood thinners. He is not having any pain.  Denies any neck pain extremity pain.  No weakness numbness tingling.  He came given some mild swelling to the top of his head and that he is on a blood thinner.  Well otherwise.  Not have any major headache.  The history is provided by the patient.       Prior to Admission medications   Medication Sig Start Date End Date Taking? Authorizing Provider  albuterol  (VENTOLIN  HFA) 108 (90 Base) MCG/ACT inhaler Inhale 2 puffs into the lungs every 6 (six) hours as needed for wheezing or shortness of breath. 12/21/23   Norleen Lynwood ORN, MD  CALCIUM -MAGNESIUM-ZINC PO Take 1 tablet by mouth 2 (two) times daily.     [provider]  cephALEXin  (KEFLEX ) 500 MG capsule Take 1 capsule (500 mg total) by mouth 3 (three) times daily. 03/31/24   Gershon Donnice SAUNDERS, DPM  cholecalciferol (VITAMIN D ) 1000 UNITS tablet Take 1,000 Units by mouth daily.     [provider]  clobetasol  cream (TEMOVATE ) 0.05 % Apply 1 Application topically 2 (two) times daily. To scaly areas on back, abdomen and left arm for 2 weeks then stop. Take a 2 week break before restarting. 06/07/23   Alm Delon SAILOR, DO  Clobetasol  Propionate (TEMOVATE ) 0.05 % external spray Apply topically 2 (two) times daily. 10/27/21   Livingston Rigg, MD  Cod Liver Oil CAPS Take 1 capsule by mouth every morning.    [provider]  cyclobenzaprine  (FLEXERIL ) 10 MG tablet TAKE 1 TABLET BY MOUTH AT  BEDTIME 02/22/24   Rollene Almarie LABOR, MD  ELIQUIS  5 MG TABS tablet TAKE 1 TABLET BY MOUTH TWICE  DAILY 08/25/23   Rollene Almarie LABOR, MD  famotidine  (PEPCID ) 20 MG  tablet TAKE 1 TABLET BY MOUTH DAILY 02/22/24   Rollene Almarie LABOR, MD  fluticasone  (FLONASE ) 50 MCG/ACT nasal spray Place 2 sprays into both nostrils daily. 03/02/23   Rollene Almarie LABOR, MD  hydrochlorothiazide  (MICROZIDE ) 12.5 MG capsule Take 1 capsule (12.5 mg total) by mouth daily. 01/24/24   Pietro Redell RAMAN, MD  hydroxychloroquine  (PLAQUENIL ) 200 MG tablet TAKE 1 TABLET BY MOUTH TWICE  DAILY 03/28/24   Dolphus Reiter, MD  levofloxacin  (LEVAQUIN ) 500 MG tablet Take 1 tablet (500 mg total) by mouth daily. 12/21/23   Norleen Lynwood ORN, MD  lisinopril  (ZESTRIL ) 2.5 MG tablet Take 1 tablet (2.5 mg total) by mouth daily. 01/12/24 04/11/24  Pietro Redell RAMAN, MD  Lutein-Zeaxanthin 25-5 MG CAPS Take 1 capsule by mouth every morning.    [provider]  misoprostol  (CYTOTEC ) 100 MCG tablet TAKE 1 TABLET BY MOUTH TWICE  DAILY AS NEEDED 04/01/23   Rollene Almarie LABOR, MD  Multiple Vitamin (MULTIVITAMIN WITH MINERALS) TABS tablet Take 1 tablet by mouth daily.    [provider]  Roflumilast  (ZORYVE ) 0.3 % CREA Apply to affected areas of the body BID. 01/18/24   Alm Delon SAILOR, DO  rosuvastatin  (CRESTOR ) 40 MG tablet TAKE 1 TABLET BY MOUTH DAILY 02/24/24   Pietro Redell RAMAN, MD  triamcinolone  cream (KENALOG ) 0.1 % Apply 1  application topically daily. Apply to psoriasis rash 06/20/19   Gershon Donnice SAUNDERS, DPM  vitamin B-12 (CYANOCOBALAMIN ) 1000 MCG tablet Take 1,000 mcg by mouth daily.    [provider]    Allergies: Azithromycin, Latex, and Prednisone    Review of Systems  Updated Vital Signs BP 137/84   Pulse 75   Temp 97.6 F (36.4 C)   Resp 16   SpO2 99%   Physical Exam Vitals and nursing note reviewed.  Constitutional:      General: He is not in acute distress.    Appearance: He is well-developed.  HENT:     Head:     Comments: Mild bruise to the top of his head Eyes:     Extraocular Movements: Extraocular movements intact.     Conjunctiva/sclera:  Conjunctivae normal.     Pupils: Pupils are equal, round, and reactive to light.  Cardiovascular:     Rate and Rhythm: Normal rate and regular rhythm.     Heart sounds: No murmur heard. Pulmonary:     Effort: Pulmonary effort is normal. No respiratory distress.     Breath sounds: Normal breath sounds.  Abdominal:     General: Abdomen is flat.     Palpations: Abdomen is soft.     Tenderness: There is no abdominal tenderness.  Musculoskeletal:        General: No swelling or tenderness.     Cervical back: Neck supple.     Comments: No midline spinal tenderness  Skin:    General: Skin is warm and dry.     Capillary Refill: Capillary refill takes less than 2 seconds.  Neurological:     General: No focal deficit present.     Mental Status: He is alert and oriented to person, place, and time.     Cranial Nerves: No cranial nerve deficit.     Sensory: No sensory deficit.     Motor: No weakness.     Coordination: Coordination normal.  Psychiatric:        Mood and Affect: Mood normal.     (all labs ordered are listed, but only abnormal results are displayed) Labs Reviewed - No data to display  EKG: None  Radiology: CT Head Wo Contrast Result Date: 04/28/2024 EXAM: CT HEAD WITHOUT CONTRAST 04/28/2024 05:20:36 PM TECHNIQUE: CT of the head was performed without the administration of intravenous contrast. Automated exposure control, iterative reconstruction, and/or weight based adjustment of the mA/kV was utilized to reduce the radiation dose to as low as reasonably achievable. COMPARISON: 01/17/2024 CLINICAL HISTORY: Polytrauma, blunt. FINDINGS: BRAIN AND VENTRICLES: No acute hemorrhage. No evidence of acute infarct. No hydrocephalus. No extra-axial collection. No mass effect or midline shift. Mild atrophy and white matter changes are stable and within normal limits for age. ORBITS: Bilateral lens replacement. SINUSES: No acute abnormality. SOFT TISSUES AND SKULL: No acute soft tissue  abnormality. No skull fracture. IMPRESSION: 1. No acute intracranial abnormality. Electronically signed by: Lonni Necessary MD 04/28/2024 06:05 PM EDT RP Workstation: [id number]     Procedures   Medications Ordered in the ED - No data to display                                  Medical Decision Making Amount and/or Complexity of Data Reviewed Radiology: ordered.   Richard [name] is here with head injury.  Patient hit the top of his head on the trunk of his  car today.  Did not lose consciousness but he is on Eliquis .  He is neurologically intact.  Is not having any neck pain or pain elsewhere.  He has no midline spinal tenderness.  He is very well-appearing.  Mild bruise to the top of the head.  Will get a head CT.  Differential diagnosis likely just contusion but will rule out head bleed given that he is on blood thinner.  CT scan of the head is unremarkable.  Discharged in good condition.  Understands return precautions.  Suspect mild contusion.  No concussion symptoms.  This chart was dictated using voice recognition software.  Despite best efforts to proofread,  errors can occur which can change the documentation meaning.      Final diagnoses:  Injury of head, initial encounter    ED Discharge Orders     None          Ruthe Cornet, DO 04/28/24 1813

## 2024-04-28 NOTE — ED Triage Notes (Signed)
 Top of head hit but trunk of car Around noon Bump/ bruising on top of head No loc On eliquis 

## 2024-04-28 NOTE — Telephone Encounter (Signed)
 Patient is asking that he get a call back today being that he takes eliquis . Please advise

## 2024-04-28 NOTE — Telephone Encounter (Signed)
 Patient states he bumped his head with the truck of his car and he know has a little bump and has a headache.  He wants to know if he needs to go to the emergency room.

## 2024-04-28 NOTE — Telephone Encounter (Signed)
 Left voice message with instructions for the pt to go to the Emergency Room 10/24 per DPR. Per chart pt in currently in the ED at [facility].

## 2024-05-08 ENCOUNTER — Encounter: Payer: Self-pay | Admitting: Radiology

## 2024-05-29 ENCOUNTER — Ambulatory Visit (INDEPENDENT_AMBULATORY_CARE_PROVIDER_SITE_OTHER)

## 2024-05-29 ENCOUNTER — Ambulatory Visit (INDEPENDENT_AMBULATORY_CARE_PROVIDER_SITE_OTHER): Admitting: Podiatry

## 2024-05-29 DIAGNOSIS — M2042 Other hammer toe(s) (acquired), left foot: Secondary | ICD-10-CM

## 2024-05-29 DIAGNOSIS — M7751 Other enthesopathy of right foot: Secondary | ICD-10-CM | POA: Diagnosis not present

## 2024-05-29 DIAGNOSIS — M2041 Other hammer toe(s) (acquired), right foot: Secondary | ICD-10-CM

## 2024-05-29 DIAGNOSIS — L84 Corns and callosities: Secondary | ICD-10-CM | POA: Diagnosis not present

## 2024-05-29 DIAGNOSIS — M779 Enthesopathy, unspecified: Secondary | ICD-10-CM

## 2024-05-29 NOTE — Progress Notes (Signed)
 Subjective: No chief complaint on file.  78 year old male presents the office today with concerns of pain in the right third toe.  He states the corn is starting to come back on the toe and is causing quite a bit of pain.  He does not report any injuries.  No increased swelling or redness.  Does not report any fevers or chills.  He has no other concerns today.  Objective: AAO x3, NAD DP/PT pulses palpable bilaterally, CRT less than 3 seconds Rigid hammertoe contractures are present and this results in thick hyperkeratotic tissue at the distal aspect of the right third toe.  Upon debridement there is no underlying ulceration, drainage or any signs of infection today.  Tenderness to palpation along the skin lesion. No pain with calf compression, swelling, warmth, erythema  Assessment: Hammertoe deformities resulted in painful hyperkeratotic preulcerative lesion  Plan: -All treatment options discussed with the patient including all alternatives, risks, complications.  -X-rays obtained reviewed.  Multiple views obtained.  Notes of acute fracture.  No cortical changes to suggest osteomyelitis.  No soft tissue edema.  Arthritic changes present of the toes. -We discussed both conservative as well as surgical treatment options for hammertoes. -Sharply debrided hyperkeratotic tissue without any complications or bleeding today.  Continue offloading. -Patient encouraged to call the office with any questions, concerns, change in symptoms.   Richard [name] DPM

## 2024-05-30 ENCOUNTER — Other Ambulatory Visit: Payer: Self-pay | Admitting: Rheumatology

## 2024-05-30 ENCOUNTER — Ambulatory Visit (INDEPENDENT_AMBULATORY_CARE_PROVIDER_SITE_OTHER): Admitting: Internal Medicine

## 2024-05-30 ENCOUNTER — Encounter: Payer: Self-pay | Admitting: Internal Medicine

## 2024-05-30 ENCOUNTER — Other Ambulatory Visit: Payer: Self-pay | Admitting: Internal Medicine

## 2024-05-30 VITALS — BP 110/60 | HR 66 | Temp 98.4°F | Ht 69.0 in | Wt 162.2 lb

## 2024-05-30 DIAGNOSIS — I480 Paroxysmal atrial fibrillation: Secondary | ICD-10-CM

## 2024-05-30 DIAGNOSIS — M79652 Pain in left thigh: Secondary | ICD-10-CM | POA: Diagnosis not present

## 2024-05-30 DIAGNOSIS — I4729 Other ventricular tachycardia: Secondary | ICD-10-CM

## 2024-05-30 DIAGNOSIS — I4891 Unspecified atrial fibrillation: Secondary | ICD-10-CM

## 2024-05-30 DIAGNOSIS — J392 Other diseases of pharynx: Secondary | ICD-10-CM | POA: Insufficient documentation

## 2024-05-30 MED ORDER — APIXABAN 5 MG PO TABS
5.0000 mg | ORAL_TABLET | Freq: Two times a day (BID) | ORAL | 2 refills | Status: AC
Start: 2024-05-30 — End: ?

## 2024-05-30 NOTE — Progress Notes (Signed)
 Subjective:   Patient ID: Richard [name], male    DOB: [birth year], 78 y.o.   MRN: [id number]  Discussed the use of AI scribe software for clinical note transcription with the patient, who gave verbal consent to proceed.  History of Present Illness Richard [name] is a 78 year old male who presents with throat irritation and left leg pain.  He experiences intermittent irritation or discomfort in the back of his throat, which he suspects may be due to yelling or postnasal drip. He has sinus congestion and stuffiness, sometimes gargles and spits up mucus, but does not have difficulty swallowing or pain when swallowing. He uses Flonase  nasal spray sparingly due to throat dryness and inquires about reducing the dosage from two sprays to one. He has a history of acid reflux, which has calmed down, but is concerned about the possibility of silent GERD contributing to his throat symptoms. No problems swallowing or pain when swallowing. Reports sinus pressure and ear discomfort. Reports postnasal drip.  He describes left leg pain that began after a long walk about three to four weeks ago. The pain is located around the thigh and knee, feeling tight and sore, especially when stepping on it or kneeling. The pain has slightly improved but persists, with occasional instability in the knee, particularly when descending stairs. He attributes the pain to muscle strain from the walk, which included slopes, and mentions a history of toe calluses affecting his gait, which have been recently treated.  Review of Systems  Constitutional: Negative.   HENT:  Positive for postnasal drip and sore throat.   Eyes: Negative.   Respiratory:  Negative for cough, chest tightness and shortness of breath.   Cardiovascular:  Negative for chest pain, palpitations and leg swelling.  Gastrointestinal:  Negative for abdominal distention, abdominal pain, constipation, diarrhea, nausea and vomiting.  Musculoskeletal:  Positive for  myalgias.  Skin: Negative.   Neurological: Negative.   Psychiatric/Behavioral: Negative.      Objective:  Physical Exam Constitutional:      Appearance: He is well-developed.  HENT:     Head: Normocephalic and atraumatic.     Comments: Oropharynx with mild redness, minimal clear drainage, no sinus pain or pressure, TMs clear Cardiovascular:     Rate and Rhythm: Normal rate and regular rhythm.  Pulmonary:     Effort: Pulmonary effort is normal. No respiratory distress.     Breath sounds: Normal breath sounds. No wheezing or rales.  Abdominal:     General: Bowel sounds are normal. There is no distension.     Palpations: Abdomen is soft.     Tenderness: There is no abdominal tenderness.  Musculoskeletal:        General: Tenderness present.     Cervical back: Normal range of motion.  Skin:    General: Skin is warm and dry.  Neurological:     Mental Status: He is alert and oriented to person, place, and time.     Coordination: Coordination normal.     Vitals:   05/30/24 1004  BP: 110/60  Pulse: 66  Temp: 98.4 F (36.9 C)  TempSrc: Oral  SpO2: 98%  Weight: 162 lb 3.2 oz (73.6 kg)  Height: 5' 9 (1.753 m)    Assessment and Plan Assessment & Plan Chronic postnasal drip with throat irritation   Chronic throat irritation is likely due to postnasal drip and possible silent GERD, with symptoms including nighttime throat irritation and occasional sinus pressure. Differential diagnosis includes postnasal drip, silent GERD,  and vocal cord irritation from overuse. Reduce Flonase  to one spray per nostril to minimize throat dryness. Consider using Claritin to manage sinus drainage and reduce throat irritation. Monitor symptoms and consider ENT referral if symptoms persist or worsen.  Left thigh muscle strain   Pain and tightness around the thigh and knee followed a long walk, likely due to muscle strain from overuse, especially on inclines. No joint involvement is suspected. Provided  leg exercises to strengthen thigh muscles and improve stability. Advised on stair descent technique to prevent knee instability. Monitor symptoms and adjust activity as needed.

## 2024-05-30 NOTE — Patient Instructions (Signed)
 Try claritin or zyrtec for the throat irritation.

## 2024-05-30 NOTE — Assessment & Plan Note (Signed)
 Taking eliquis  for stroke prevention which is refilled today. Sounds regular but no EKG done.

## 2024-05-30 NOTE — Telephone Encounter (Signed)
 Last Fill: 03/28/2024  Eye exam: 08/05/2023   Labs: 12/24/2023 CBC stable.  Glucose is 106. Rest of CMP WNL  Next Visit: 06/16/2024  Last Visit: 12/24/2023  IK:Ednmpjupr arthritis   Current Dose per office note on 12/24/2023: on Plaquenil  200 twice daily since age 78.   Advised patient that he is due to update labs, patient verbalized understanding. Provided patient with lab hours and he will come next week for labs.   Okay to refill Plaquenil ?

## 2024-05-30 NOTE — Assessment & Plan Note (Signed)
 Pain and tightness around the thigh and knee followed a long walk, likely due to muscle strain from overuse, especially on inclines. No joint involvement is suspected. Provided leg exercises to strengthen thigh muscles and improve stability. Advised on stair descent technique to prevent knee instability. Monitor symptoms and adjust activity as needed.

## 2024-05-30 NOTE — Assessment & Plan Note (Signed)
 No current signs of symptoms and will monitor for recurrence.

## 2024-05-30 NOTE — Assessment & Plan Note (Signed)
 Chronic throat irritation is likely due to postnasal drip and possible silent GERD, with symptoms including nighttime throat irritation and occasional sinus pressure. Differential diagnosis includes postnasal drip, silent GERD, and vocal cord irritation from overuse. Reduce Flonase  to one spray per nostril to minimize throat dryness. Consider using Claritin to manage sinus drainage and reduce throat irritation. Monitor symptoms and consider ENT referral if symptoms persist or worsen.

## 2024-06-05 NOTE — Progress Notes (Unsigned)
 Office Visit Note  Patient: Richard [name]             Date of Birth: Nov 25, 1945           MRN: [id number]             PCP: Rollene Almarie LABOR, MD Referring: Rollene Almarie LABOR, MD Visit Date: 06/16/2024 Occupation: Data Unavailable  Subjective:  Medication management  History of Present Illness: Richard [name] is a 78 y.o. male with psoriatic arthritis, psoriasis and osteoarthritis.  He returns today after his last visit in June 2025.  He has been on Plaquenil  200 mg.  Twice daily for many years.  He continues to have some morning stiffness lasting for 2 to 3 hours.  He states with the weather change he notices increasing stiffness.  He continues to have some stiffness and discomfort in his hands.  He has some discomfort in his knees and his feet.  He has a stiffness in his neck and lower back.  He denies plantar fasciitis, Achilles tendinitis or eye inflammation.  He continues to have his psoriasis patches on his lower back.    Activities of Daily Living:  Patient reports morning stiffness for 2-3 hours.   Patient Denies nocturnal pain.  Difficulty dressing/grooming: Denies Difficulty climbing stairs: Denies Difficulty getting out of chair: Denies Difficulty using hands for taps, buttons, cutlery, and/or writing: Reports  Review of Systems  Constitutional:  Negative for fatigue.  HENT:  Negative for mouth sores and mouth dryness.   Eyes:  Negative for dryness.  Respiratory:  Negative for shortness of breath.   Cardiovascular:  Negative for chest pain and palpitations.  Gastrointestinal:  Negative for blood in stool, constipation and diarrhea.  Endocrine: Negative for increased urination.  Genitourinary:  Negative for involuntary urination.  Musculoskeletal:  Positive for joint pain, joint pain, joint swelling and morning stiffness. Negative for gait problem, myalgias, muscle weakness, muscle tenderness and myalgias.  Skin:  Negative for color change, rash, hair loss and  sensitivity to sunlight.  Allergic/Immunologic: Negative for susceptible to infections.  Neurological:  Negative for dizziness and headaches.  Hematological:  Negative for swollen glands.  Psychiatric/Behavioral:  Negative for depressed mood and sleep disturbance. The patient is not nervous/anxious.     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   Impaired fasting blood sugar 03/06/2022   Paroxysmal A-fib (HCC) 02/27/2022   Impingement syndrome of shoulder region 02/23/2022   Osteoarthritis of left glenohumeral joint 02/23/2022   Bilateral hearing loss 08/14/2020   Cough, persistent 08/14/2020   Deviated septum 08/14/2020   First degree AV block 08/24/2019   NSVT (nonsustained ventricular tachycardia) (HCC) 07/25/2019   Mixed hyperlipidemia 07/25/2019   Essential hypertension 07/03/2018   Impingement syndrome of right shoulder region 11/10/2017   Gastroesophageal reflux disease without esophagitis 03/10/2017   Rhinitis, chronic 03/10/2017   Chronic low back pain without sciatica 12/17/2015   Bilateral knee pain 12/17/2015   Psoriatic arthritis (HCC) 12/17/2015   Snoring 04/27/2014    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    Atrial fibrillation (HCC)    Back pain    Dyspnea  history of covid 09/2018 and with extertion    Dysrhythmia    GERD (gastroesophageal reflux disease)    Headache    Hypertension    Pain    PAIN AND OA RT KNEE   Pneumonia    YEARS AGO   Psoriasis    arthritis   Snoring 04/27/2014   Upper respiratory infection 12/21/2023    Family History  Problem Relation Age of Onset   Diabetes Mother     Hypertension Mother    Heart disease Mother        has a stent    Stroke Father 66   Hypertension Father    Stroke Brother    Parkinson's disease Brother    Past Surgical History:  Procedure Laterality Date   CATARACT EXTRACTION Bilateral    INGUINAL HERNIA REPAIR Left 01/29/2021   Procedure: LEFT INGUINAL HERNIA REPAIR WITH MESH;  Surgeon: Ebbie Cough, MD;  Location: [facility];  Service: General;  Laterality: Left;   KNEE ARTHROSCOPY Right 01/31/2014   Procedure: RIGHT ARTHROSCOPY KNEE WITH DEBRIDMENT CHONDROPLASTY;  Surgeon: Dempsey Melodi GAILS, MD;  Location: [facility];  Service: Orthopedics;  Laterality: Right;   LUMP REMOVED FROM LEFT BREAST Left 07/06/2006   STATES SURGERY  AT [facility] - LOCAL- NOT CANCER 5 OR MORE YRS AGO   MORTON'S NEUROMA REMOVED     TONSILLECTOMY     AS A CHILD   TRANSTHORACIC ECHOCARDIOGRAM  07/2020   Normal LV size and function.  GR 1 DD.  Mild RV enlargement.  Moderate LA & RA enlargement.  Mild MR.  MR likely related to mildly for MR/MVP.  Moderate TR, mildly elevated RAP.SABRA  Moderate ao valve thickening.  Mild AOV sclerosis but no stenosis.   Social History   Tobacco Use   Smoking status: Former    Current packs/day: 0.00    Average packs/day: 0.3 packs/day    Types: Cigarettes    Quit date: 1990    Years since quitting: 35.9    Passive exposure: Past   Smokeless tobacco: Never  Vaping Use   Vaping status: Never Used  Substance Use Topics   Alcohol use: Yes    Alcohol/week: 7.0 standard drinks of alcohol    Types: 7 Glasses of wine per week    Comment: 1 glass of wine with dinner   Drug use: No   Social History   Social History Narrative   Right handed, caffeine 2-3 cups daily, married, no kids, has baby bird.  WC back injury, so not working at this time.  BS in econmics.     Immunization History  Administered Date(s) Administered   Influenza, Quadrivalent, Recombinant, Inj, Pf 04/16/2017, 04/01/2018    Influenza-Unspecified 04/27/2024   PNEUMOCOCCAL CONJUGATE-20 03/06/2022   Pneumococcal Conjugate-13 07/30/2006   Pneumococcal Polysaccharide-23 07/30/2006, 06/14/2018   Tdap 07/28/2018   Unspecified SARS-COV-2 Vaccination 05/04/2024   Zoster Recombinant(Shingrix) 08/16/2020, 12/06/2020   Zoster, Live 05/14/2012     Objective: Vital Signs: BP 103/64 (BP Location: Left Arm, Patient Position: Sitting, Cuff Size: Small)   Pulse 71   Temp 98.1 F (36.7 C)   Resp 10   Ht 5' 9 (1.753 m)   Wt 162 lb 9.6 oz (73.8 kg)   BMI 24.01 kg/m    Physical Exam Vitals and nursing note reviewed.  Constitutional:      Appearance: He is well-developed.  HENT:     Head: Normocephalic and atraumatic.  Eyes:     Conjunctiva/sclera: Conjunctivae normal.  Pupils: Pupils are equal, round, and reactive to light.  Cardiovascular:     Rate and Rhythm: Normal rate and regular rhythm.     Heart sounds: Normal heart sounds.  Pulmonary:     Effort: Pulmonary effort is normal.     Breath sounds: Normal breath sounds.  Abdominal:     General: Bowel sounds are normal.     Palpations: Abdomen is soft.  Musculoskeletal:     Cervical back: Normal range of motion and neck supple.  Skin:    General: Skin is warm and dry.     Capillary Refill: Capillary refill takes less than 2 seconds.  Neurological:     Mental Status: He is alert and oriented to person, place, and time.  Psychiatric:        Behavior: Behavior normal.      Musculoskeletal Exam: He had limited lateral rotation, flexion and extension of the cervical spine.  Thoracic kyphosis was noted.  He had limited range of motion of his thoracic and lumbar spine.  He had forward flexion and abduction limited to 90 degrees and also limited internal rotation.  Contracture send bilateral elbows was noted with no synovitis.  He had limited extension of PIP and DIP joints and incomplete fist formation.  MCP thickening with no synovitis was noted.  Hip joints  were in good range of motion.  He had limited extension of bilateral knee joints with warmth on palpation.  There was no tenderness over ankles or MTPs.  There was no plantar fasciitis or Achilles tendinitis.  CDAI Exam: CDAI Score: -- Patient Global: --; Provider Global: -- Swollen: --; Tender: -- Joint Exam 06/16/2024   No joint exam has been documented for this visit   There is currently no information documented on the homunculus. Go to the Rheumatology activity and complete the homunculus joint exam.  Investigation: No additional findings.  Imaging: DG Foot Complete Right Result Date: 05/29/2024 Please see detailed radiograph report in office note.   Recent Labs: Lab Results  Component Value Date   WBC 5.6 06/13/2024   HGB 12.5 (L) 06/13/2024   PLT 155 06/13/2024   NA 141 06/13/2024   K 3.9 06/13/2024   CL 105 06/13/2024   CO2 31 06/13/2024   GLUCOSE 107 (H) 06/13/2024   BUN 16 06/13/2024   CREATININE 0.71 06/13/2024   BILITOT 1.1 06/13/2024   ALKPHOS 93 09/01/2023   AST 34 06/13/2024   ALT 30 06/13/2024   PROT 6.5 06/13/2024   ALBUMIN 4.2 09/01/2023   CALCIUM  9.1 06/13/2024   GFRAA 98 01/29/2020    Speciality Comments: PLQ Eye Exam: 08/05/2023 WNL @ [facility] Follow up 1 year  Otezla-nausea and weight loss  Procedures:  No procedures performed Allergies: Azithromycin, Latex, and Prednisone   Assessment / Plan:     Visit Diagnoses: Psoriatic arthritis (HCC) - dxd in his 40s while in [place] with RA.  Later ttd by Dr. Ishmael. On Plaquenil  over 30 years.severe erosive end-stage PsA :.  Detailed discussion regarding the use of Skyrizi at the last visit.  Patient states he reviewed the side effects and does not want to take any medications which will suppress his immune system.  He continues to have joint stiffness.  He denies any joint swelling.  Warmth is noted in his bilateral knee joints.  There is no history of dactylitis, plantar fasciitis,  Achilles tendinitis.  Psoriasis - he continues to have scattered psoriasis patches for which he uses topical agents.  He noticed some psoriasis patches on his back.  High risk medication use - on Plaquenil  200 twice daily since age 60. PLQ Eye Exam: 08/05/2023.  Labs obtained on June 13, 2024 showed anemia.  CMP was normal.  Patient is scheduled to have eye examination in January 2026.  Osteoarthritis of left glenohumeral joint - He is followed by orthopedics.  He had limited range of motion without much discomfort.  Impingement syndrome of right shoulder region  Pain in both hands - Severe erosive arthritis involving intercarpal joints.  He had contractures in most of his PIPs and DIPs.  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  Primary osteoarthritis of both knees -continues to have limited extension of his knee joints and some inflammation.  X-ray obtained in the past showed bilateral severe osteoarthritis and chondromalacia patella.  Patient is followed by orthopedics.  Pain in both feet -no synovitis, plantar fasciitis or Achilles tendinitis was noted.  Severe erosive inflammatory arthritis and osteoarthritis lab was noted on the previous x-rays.  Pain, neck -he limited range of motion of his cervical spine.  Syndesmophytes and severe C4-5-6 narrowing was noted on the x-rays.  Chronic midline low back pain without sciatica -he had limited range of motion of thoracic and lumbar spine.  I do not have any lumbar spine x-rays available.  Previous x-rays showed partial ankylosis of both SI joints.  Essential hypertension-blood pressure was normal at 103/64.  Paroxysmal A-fib (HCC)  First degree AV block  Mitral valvular prolapse  Mixed hyperlipidemia  NSVT (nonsustained ventricular tachycardia) (HCC)  Gastroesophageal reflux disease without esophagitis  Varicose ulcer of lower extremity, right (HCC)  Orders: No orders of the defined types were placed in this  encounter.  No orders of the defined types were placed in this encounter.    Follow-Up Instructions: Return in about 5 months (around 11/14/2024) for Psoriatic arthritis.   Maya Nash, MD  Note - This record has been created using Animal nutritionist.  Chart creation errors have been sought, but may not always  have been located. Such creation errors do not reflect on  the standard of medical care.

## 2024-06-12 ENCOUNTER — Other Ambulatory Visit: Payer: Self-pay

## 2024-06-12 DIAGNOSIS — Z79899 Other long term (current) drug therapy: Secondary | ICD-10-CM

## 2024-06-13 ENCOUNTER — Other Ambulatory Visit: Payer: Self-pay | Admitting: *Deleted

## 2024-06-13 DIAGNOSIS — Z79899 Other long term (current) drug therapy: Secondary | ICD-10-CM

## 2024-06-13 LAB — CBC WITH DIFFERENTIAL/PLATELET
Absolute Lymphocytes: 1154 {cells}/uL (ref 850–3900)
Absolute Monocytes: 650 {cells}/uL (ref 200–950)
Basophils Absolute: 39 {cells}/uL (ref 0–200)
Basophils Relative: 0.7 %
Eosinophils Absolute: 123 {cells}/uL (ref 15–500)
Eosinophils Relative: 2.2 %
HCT: 39.4 % (ref 39.4–51.1)
Hemoglobin: 12.5 g/dL — ABNORMAL LOW (ref 13.2–17.1)
MCH: 28.5 pg (ref 27.0–33.0)
MCHC: 31.7 g/dL (ref 31.6–35.4)
MCV: 89.7 fL (ref 81.4–101.7)
MPV: 10.2 fL (ref 7.5–12.5)
Monocytes Relative: 11.6 %
Neutro Abs: 3634 {cells}/uL (ref 1500–7800)
Neutrophils Relative %: 64.9 %
Platelets: 155 Thousand/uL (ref 140–400)
RBC: 4.39 Million/uL (ref 4.20–5.80)
RDW: 13.8 % (ref 11.0–15.0)
Total Lymphocyte: 20.6 %
WBC: 5.6 Thousand/uL (ref 3.8–10.8)

## 2024-06-13 LAB — COMPREHENSIVE METABOLIC PANEL WITH GFR
AG Ratio: 1.6 (calc) (ref 1.0–2.5)
ALT: 30 U/L (ref 9–46)
AST: 34 U/L (ref 10–35)
Albumin: 4 g/dL (ref 3.6–5.1)
Alkaline phosphatase (APISO): 84 U/L (ref 35–144)
BUN: 16 mg/dL (ref 7–25)
CO2: 31 mmol/L (ref 20–32)
Calcium: 9.1 mg/dL (ref 8.6–10.3)
Chloride: 105 mmol/L (ref 98–110)
Creat: 0.71 mg/dL (ref 0.70–1.28)
Globulin: 2.5 g/dL (ref 1.9–3.7)
Glucose, Bld: 107 mg/dL — ABNORMAL HIGH (ref 65–99)
Potassium: 3.9 mmol/L (ref 3.5–5.3)
Sodium: 141 mmol/L (ref 135–146)
Total Bilirubin: 1.1 mg/dL (ref 0.2–1.2)
Total Protein: 6.5 g/dL (ref 6.1–8.1)
eGFR: 95 mL/min/1.73m2 (ref >=60)

## 2024-06-14 ENCOUNTER — Ambulatory Visit: Payer: Self-pay | Admitting: Rheumatology

## 2024-06-14 ENCOUNTER — Other Ambulatory Visit: Payer: Self-pay | Admitting: Internal Medicine

## 2024-06-14 NOTE — Progress Notes (Signed)
 CBC and CMP are stable.  Hemoglobin remains low.  Please forward results to his PCP.

## 2024-06-16 ENCOUNTER — Ambulatory Visit: Attending: Rheumatology | Admitting: Rheumatology

## 2024-06-16 ENCOUNTER — Encounter: Payer: Self-pay | Admitting: Rheumatology

## 2024-06-16 VITALS — BP 103/64 | HR 71 | Temp 98.1°F | Resp 10 | Ht 69.0 in | Wt 162.6 lb

## 2024-06-16 DIAGNOSIS — M79641 Pain in right hand: Secondary | ICD-10-CM

## 2024-06-16 DIAGNOSIS — M545 Low back pain, unspecified: Secondary | ICD-10-CM

## 2024-06-16 DIAGNOSIS — I4729 Other ventricular tachycardia: Secondary | ICD-10-CM

## 2024-06-16 DIAGNOSIS — M19012 Primary osteoarthritis, left shoulder: Secondary | ICD-10-CM | POA: Diagnosis not present

## 2024-06-16 DIAGNOSIS — I480 Paroxysmal atrial fibrillation: Secondary | ICD-10-CM

## 2024-06-16 DIAGNOSIS — L409 Psoriasis, unspecified: Secondary | ICD-10-CM | POA: Diagnosis not present

## 2024-06-16 DIAGNOSIS — I341 Nonrheumatic mitral (valve) prolapse: Secondary | ICD-10-CM

## 2024-06-16 DIAGNOSIS — M7541 Impingement syndrome of right shoulder: Secondary | ICD-10-CM

## 2024-06-16 DIAGNOSIS — I440 Atrioventricular block, first degree: Secondary | ICD-10-CM

## 2024-06-16 DIAGNOSIS — I83019 Varicose veins of right lower extremity with ulcer of unspecified site: Secondary | ICD-10-CM

## 2024-06-16 DIAGNOSIS — I10 Essential (primary) hypertension: Secondary | ICD-10-CM | POA: Diagnosis not present

## 2024-06-16 DIAGNOSIS — M79671 Pain in right foot: Secondary | ICD-10-CM

## 2024-06-16 DIAGNOSIS — L4050 Arthropathic psoriasis, unspecified: Secondary | ICD-10-CM

## 2024-06-16 DIAGNOSIS — E782 Mixed hyperlipidemia: Secondary | ICD-10-CM

## 2024-06-16 DIAGNOSIS — M542 Cervicalgia: Secondary | ICD-10-CM

## 2024-06-16 DIAGNOSIS — M24522 Contracture, left elbow: Secondary | ICD-10-CM

## 2024-06-16 DIAGNOSIS — M170 Bilateral primary osteoarthritis of knee: Secondary | ICD-10-CM

## 2024-06-16 DIAGNOSIS — M79642 Pain in left hand: Secondary | ICD-10-CM

## 2024-06-16 DIAGNOSIS — Z79899 Other long term (current) drug therapy: Secondary | ICD-10-CM

## 2024-06-16 DIAGNOSIS — K219 Gastro-esophageal reflux disease without esophagitis: Secondary | ICD-10-CM

## 2024-06-16 DIAGNOSIS — L97919 Non-pressure chronic ulcer of unspecified part of right lower leg with unspecified severity: Secondary | ICD-10-CM

## 2024-06-16 DIAGNOSIS — M24521 Contracture, right elbow: Secondary | ICD-10-CM

## 2024-06-16 DIAGNOSIS — G8929 Other chronic pain: Secondary | ICD-10-CM

## 2024-06-16 DIAGNOSIS — M79672 Pain in left foot: Secondary | ICD-10-CM

## 2024-06-16 NOTE — Patient Instructions (Signed)
 Standing Labs We placed an order today for your standing lab work.   Please have your standing labs drawn in May  Please have your labs drawn 2 weeks prior to your appointment so that the provider can discuss your lab results at your appointment, if possible.  Please note that you may see your imaging and lab results in MyChart before we have reviewed them. We will contact you once all results are reviewed. Please allow our office up to 72 hours to thoroughly review all of the results before contacting the office for clarification of your results.  WALK-IN LAB HOURS  Monday through Thursday from 8:00 am - 4:30 pm and Friday from 8:00 am-12:00 pm.  Patients with office visits requiring labs will be seen before walk-in labs.  You may encounter longer than normal wait times. Please allow additional time. Wait times may be shorter on  Monday and Thursday afternoons.  We do not book appointments for walk-in labs. We appreciate your patience and understanding with our staff.   Labs are drawn by [facility]. Please bring your co-pay at the time of your lab draw.  You may receive a bill from [facility] for your lab work.  Please note if you are on Hydroxychloroquine  and and an order has been placed for a Hydroxychloroquine  level,  you will need to have it drawn 4 hours or more after your last dose.  If you wish to have your labs drawn at another location, please call the office 24 hours in advance so we can fax the orders.  The office is located at [address], [address], [place], [place] [address]   If you have any questions regarding directions or hours of operation,  please call [phone].   As a reminder, please drink plenty of water prior to coming for your lab work. Thanks!   Vaccines You are taking a medication(s) that can suppress your immune system.  The following immunizations are recommended: Flu annually Covid-19 RSV Td/Tdap (tetanus, diphtheria, pertussis) every 10  years Pneumonia (Prevnar 15 then Pneumovax 23 at least 1 year apart.  Alternatively, can take Prevnar 20 without needing additional dose) Shingrix: 2 doses from 4 weeks to 6 months apart  Please check with your PCP to make sure you are up to date.

## 2024-06-21 ENCOUNTER — Other Ambulatory Visit (HOSPITAL_BASED_OUTPATIENT_CLINIC_OR_DEPARTMENT_OTHER): Payer: Self-pay

## 2024-06-21 ENCOUNTER — Ambulatory Visit (HOSPITAL_BASED_OUTPATIENT_CLINIC_OR_DEPARTMENT_OTHER): Admitting: Orthopaedic Surgery

## 2024-06-21 DIAGNOSIS — M25511 Pain in right shoulder: Secondary | ICD-10-CM | POA: Diagnosis not present

## 2024-06-21 DIAGNOSIS — M25512 Pain in left shoulder: Secondary | ICD-10-CM | POA: Diagnosis not present

## 2024-06-21 DIAGNOSIS — G8929 Other chronic pain: Secondary | ICD-10-CM

## 2024-06-21 MED ORDER — TRIAMCINOLONE ACETONIDE 40 MG/ML IJ SUSP
80.0000 mg | INTRAMUSCULAR | Status: AC | PRN
  Administered 2024-06-21: 12:00:00 80 mg via INTRA_ARTICULAR

## 2024-06-21 MED ORDER — LIDOCAINE HCL 1 % IJ SOLN
4.0000 mL | INTRAMUSCULAR | Status: AC | PRN
  Administered 2024-06-21: 12:00:00 4 mL

## 2024-06-21 NOTE — Progress Notes (Signed)
 Chief Complaint: Bilateral shoulder pain     History of Present Illness:   06/21/2024: Presents today for follow-up of bilateral shoulders.  He has been getting some temporary relief from his injections and is seeking additional injections  Richard [name] is a 78 y.o. male presents today with bilateral shoulder pain.  He has been experiencing this for many years.  He has had multiple injections into both the right and left shoulder in the past.  He has participated in physical therapy for strengthening in the past of both shoulders without any relief.  He continues to have persistent pain with overhead range of motion.  He is right-hand dominant.  He does enjoy being very active with his wife.  He has on anticoagulation from his heart perspective.  He is having a very difficult time laying directly on the side as well.  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    Atrial fibrillation (HCC)    Back pain    Dyspnea    history of covid 09/2018 and with extertion    Dysrhythmia    GERD (gastroesophageal reflux disease)    Headache    Hypertension    Pain    PAIN AND OA RT KNEE   Pneumonia    YEARS AGO   Psoriasis    arthritis   Snoring 04/27/2014   Upper respiratory infection 12/21/2023   Past Surgical History:  Procedure Laterality Date   CATARACT EXTRACTION Bilateral    INGUINAL HERNIA REPAIR Left 01/29/2021   Procedure: LEFT INGUINAL HERNIA REPAIR WITH MESH;  Surgeon: Ebbie Cough, MD;  Location: [facility];  Service: General;  Laterality: Left;   KNEE ARTHROSCOPY Right 01/31/2014   Procedure: RIGHT ARTHROSCOPY KNEE WITH DEBRIDMENT CHONDROPLASTY;  Surgeon: Dempsey Melodi GAILS, MD;  Location: [facility];  Service: Orthopedics;  Laterality: Right;   LUMP REMOVED FROM LEFT BREAST Left 07/06/2006    STATES SURGERY  AT [facility] - LOCAL- NOT CANCER 5 OR MORE YRS AGO   MORTON'S NEUROMA REMOVED     TONSILLECTOMY     AS A CHILD   TRANSTHORACIC ECHOCARDIOGRAM  07/2020   Normal LV size and function.  GR 1 DD.  Mild RV enlargement.  Moderate LA & RA enlargement.  Mild MR.  MR likely related to mildly for MR/MVP.  Moderate TR, mildly elevated RAP.SABRA  Moderate ao valve thickening.  Mild AOV sclerosis but no stenosis.   Social History   Socioeconomic History   Marital status: Married    Spouse name: Not on file   Number of children: 0   Years of education: Not on file   Highest education level: Bachelor's degree (e.g., BA, AB, BS)  Occupational History   Not on file  Tobacco Use   Smoking status: Former    Current packs/day: 0.00    Average packs/day: 0.3 packs/day    Types: Cigarettes    Quit date: 1990    Years since quitting: 35.9    Passive exposure: Past   Smokeless tobacco: Never  Vaping Use   Vaping status: Never Used  Substance and Sexual Activity   Alcohol use: Yes    Alcohol/week: 7.0 standard drinks of alcohol    Types: 7 Glasses of wine per week    Comment: 1 glass of wine with dinner   Drug  use: No   Sexual activity: Yes  Other Topics Concern   Not on file  Social History Narrative   Right handed, caffeine 2-3 cups daily, married, no kids, has baby bird.  WC back injury, so not working at this time.  BS in econmics.   Social Drivers of Health   Tobacco Use: Medium Risk (06/16/2024)   Patient History    Smoking Tobacco Use: Former    Smokeless Tobacco Use: Never    Passive Exposure: Past  Physicist, Medical Strain: Low Risk (05/27/2024)   Overall Financial Resource Strain (CARDIA)    Difficulty of Paying Living Expenses: Not hard at all  Food Insecurity: No Food Insecurity (05/27/2024)   Epic    Worried About Programme Researcher, Broadcasting/film/video in the Last Year: Never true    Ran Out of Food in the Last Year: Never true  Transportation Needs: No  Transportation Needs (05/27/2024)   Epic    Lack of Transportation (Medical): No    Lack of Transportation (Non-Medical): No  Physical Activity: Sufficiently Active (05/27/2024)   Exercise Vital Sign    Days of Exercise per Week: 5 days    Minutes of Exercise per Session: 60 min  Stress: No Stress Concern Present (05/27/2024)   [employer] of Occupational Health - Occupational Stress Questionnaire    Feeling of Stress: Not at all  Social Connections: Moderately Isolated (05/27/2024)   Social Connection and Isolation Panel    Frequency of Communication with Friends and Family: More than three times a week    Frequency of Social Gatherings with Friends and Family: Three times a week    Attends Religious Services: Never    Active Member of Clubs or Organizations: No    Attends Banker Meetings: Not on file    Marital Status: Married  Depression (PHQ2-9): Low Risk (05/30/2024)   Depression (PHQ2-9)    PHQ-2 Score: 0  Alcohol Screen: Low Risk (05/27/2024)   Alcohol Screen    Last Alcohol Screening Score (AUDIT): 4  Housing: Low Risk (05/27/2024)   Epic    Unable to Pay for Housing in the Last Year: No    Number of Times Moved in the Last Year: 0    Homeless in the Last Year: No  Utilities: Not on file  Health Literacy: Not on file   Family History  Problem Relation Age of Onset   Diabetes Mother    Hypertension Mother    Heart disease Mother        has a stent    Stroke Father 30   Hypertension Father    Stroke Brother    Parkinson's disease Brother    Allergies  Allergen Reactions   Azithromycin Diarrhea and Nausea And Vomiting   Latex     WHEN PT USED TO WEAR LATEX GLOVES AT WORK HE NOTICED SKIN REDNESS AND ITCHING OF HANDS.   Prednisone Other (See Comments)    makes me angry and I feel weird.   Current Outpatient Medications  Medication Sig Dispense Refill   albuterol  (VENTOLIN  HFA) 108 (90 Base) MCG/ACT inhaler  Inhale 2 puffs into the lungs every 6 (six) hours as needed for wheezing or shortness of breath. 8 g 1   apixaban  (ELIQUIS ) 5 MG TABS tablet Take 1 tablet (5 mg total) by mouth 2 (two) times daily. 200 tablet 2   CALCIUM -MAGNESIUM-ZINC PO Take 1 tablet by mouth 2 (two) times daily.      cholecalciferol (VITAMIN D ) 1000 UNITS tablet  Take 1,000 Units by mouth daily.      clobetasol  cream (TEMOVATE ) 0.05 % Apply 1 Application topically 2 (two) times daily. To scaly areas on back, abdomen and left arm for 2 weeks then stop. Take a 2 week break before restarting. 60 g 2   Clobetasol  Propionate (TEMOVATE ) 0.05 % external spray Apply topically 2 (two) times daily. (Patient not taking: Reported on 06/16/2024) 59 mL 6   Cod Liver Oil CAPS Take 1 capsule by mouth every morning.     cyclobenzaprine  (FLEXERIL ) 10 MG tablet TAKE 1 TABLET BY MOUTH AT  BEDTIME 90 tablet 1   famotidine  (PEPCID ) 20 MG tablet TAKE 1 TABLET BY MOUTH DAILY 100 tablet 1   fluticasone  (FLONASE ) 50 MCG/ACT nasal spray Place 2 sprays into both nostrils daily. 48 g 3   hydrochlorothiazide  (MICROZIDE ) 12.5 MG capsule Take 1 capsule (12.5 mg total) by mouth daily. 30 capsule 6   hydroxychloroquine  (PLAQUENIL ) 200 MG tablet TAKE 1 TABLET BY MOUTH TWICE  DAILY 180 tablet 0   lisinopril  (ZESTRIL ) 2.5 MG tablet Take 1 tablet (2.5 mg total) by mouth daily. 90 tablet 3   Lutein-Zeaxanthin 25-5 MG CAPS Take 1 capsule by mouth every morning.     misoprostol  (CYTOTEC ) 100 MCG tablet TAKE 1 TABLET BY MOUTH TWICE  DAILY AS NEEDED 240 tablet 2   Multiple Vitamin (MULTIVITAMIN WITH MINERALS) TABS tablet Take 1 tablet by mouth daily.     Roflumilast  (ZORYVE ) 0.3 % CREA Apply to affected areas of the body BID. 60 g 2   rosuvastatin  (CRESTOR ) 40 MG tablet TAKE 1 TABLET BY MOUTH DAILY 100 tablet 3   triamcinolone  cream (KENALOG ) 0.1 % Apply 1 application topically daily. Apply to psoriasis rash 30 g 1   vitamin B-12 (CYANOCOBALAMIN ) 1000  MCG tablet Take 1,000 mcg by mouth daily.     No current facility-administered medications for this visit.   No results found.  Review of Systems:   A ROS was performed including pertinent positives and negatives as documented in the HPI.  Physical Exam :   Constitutional: NAD and appears stated age Neurological: Alert and oriented Psych: Appropriate affect and cooperative There were no vitals taken for this visit.   Comprehensive Musculoskeletal Exam:    Forward elevation of both shoulders is to 110 degrees.  External rotation is to 15 degrees bilaterally.  Internal rotation is to side bilaterally.  There is weakness with forward elevation and 4 out of 5 strength bilaterally   Imaging:   Xray (3 views right shoulder, 3 views left shoulder): Evidence of rotator cuff arthropathy bilaterally with degenerative findings about the glenohumeral joint bilaterally    I personally reviewed and interpreted the radiographs.   Assessment and Plan:   78 y.o. 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  At today's visit I did discuss that he has trialed both injections as well as therapy in the past.  At this time he has very limited overhead range of motion with significant pain.  Given this we did discuss possibility of surgical intervention.  At this time I would like to obtain an MRI of both shoulders that I can figure out and further assess if he would be a candidate for reverse shoulder arthroplasty.  He is also seeking additional injections as well   Procedure Note  Patient: Richard [name]             Date of Birth: [birth year]           MRN:  [id number]             Visit Date: 06/21/2024  Procedures: Visit Diagnoses:  1. Chronic left shoulder pain   2. Chronic right shoulder pain     Large Joint Inj: R subacromial bursa on 06/21/2024 12:21 PM Indications: pain Details: 22 G 1.5 in needle, ultrasound-guided anterior approach  Arthrogram: No  Medications: 4 mL lidocaine  1 %;  80 mg triamcinolone  acetonide 40 MG/ML Outcome: tolerated well, no immediate complications Procedure, treatment alternatives, risks and benefits explained, specific risks discussed. Consent was given by the patient. Immediately prior to procedure a time out was called to verify the correct patient, procedure, equipment, support staff and site/side marked as required. Patient was prepped and draped in the usual sterile fashion.    Large Joint Inj: L subacromial bursa on 06/21/2024 12:21 PM Indications: pain Details: 22 G 1.5 in needle, ultrasound-guided anterior approach  Arthrogram: No  Medications: 4 mL lidocaine  1 %; 80 mg triamcinolone  acetonide 40 MG/ML Outcome: tolerated well, no immediate complications Procedure, treatment alternatives, risks and benefits explained, specific risks discussed. Consent was given by the patient. Immediately prior to procedure a time out was called to verify the correct patient, procedure, equipment, support staff and site/side marked as required. Patient was prepped and draped in the usual sterile fashion.        -Plan for right shoulder reverse shoulder arthroplasty   After a lengthy discussion of treatment options, including risks, benefits, alternatives, complications of surgical and nonsurgical conservative options, the patient elected surgical repair.   The patient  is aware of the material risks  and complications including, but not limited to injury to adjacent structures, neurovascular injury, infection, numbness, bleeding, implant failure, thermal burns, stiffness, persistent pain, failure to heal, disease transmission from allograft, need for further surgery, dislocation, anesthetic risks, blood clots, risks of death,and others. The probabilities of surgical success and failure discussed with patient given their particular co-morbidities.The time and nature of expected rehabilitation and recovery was discussed.The patient's questions were all  answered preoperatively.  No barriers to understanding were noted. I explained the natural history of the disease process and Rx rationale.  I explained to the patient what I considered to be reasonable expectations given their personal situation.  The final treatment plan was arrived at through a shared patient decision making process model.    I personally saw and evaluated the patient, and participated in the management and treatment plan.  Elspeth Parker, MD Attending Physician, Orthopedic Surgery  This document was dictated using Dragon voice recognition software. A reasonable attempt at proof reading has been made to minimize errors.

## 2024-07-03 ENCOUNTER — Encounter: Payer: Self-pay | Admitting: Podiatry

## 2024-07-03 ENCOUNTER — Ambulatory Visit: Admitting: Podiatry

## 2024-07-03 DIAGNOSIS — M2041 Other hammer toe(s) (acquired), right foot: Secondary | ICD-10-CM | POA: Diagnosis not present

## 2024-07-03 DIAGNOSIS — L84 Corns and callosities: Secondary | ICD-10-CM | POA: Diagnosis not present

## 2024-07-03 DIAGNOSIS — Z7901 Long term (current) use of anticoagulants: Secondary | ICD-10-CM

## 2024-07-03 DIAGNOSIS — M79674 Pain in right toe(s): Secondary | ICD-10-CM | POA: Diagnosis not present

## 2024-07-03 DIAGNOSIS — M79675 Pain in left toe(s): Secondary | ICD-10-CM | POA: Diagnosis not present

## 2024-07-03 DIAGNOSIS — B351 Tinea unguium: Secondary | ICD-10-CM | POA: Diagnosis not present

## 2024-07-03 DIAGNOSIS — L539 Erythematous condition, unspecified: Secondary | ICD-10-CM | POA: Diagnosis not present

## 2024-07-03 MED ORDER — CEPHALEXIN 500 MG PO CAPS: 500.0000 mg | ORAL_CAPSULE | Freq: Three times a day (TID) | ORAL | 0 refills | Status: AC

## 2024-07-03 NOTE — Progress Notes (Unsigned)
 Lost abx

## 2024-07-12 ENCOUNTER — Encounter: Payer: Self-pay | Admitting: Cardiology

## 2024-07-12 ENCOUNTER — Other Ambulatory Visit: Payer: Self-pay

## 2024-07-12 ENCOUNTER — Emergency Department (HOSPITAL_BASED_OUTPATIENT_CLINIC_OR_DEPARTMENT_OTHER)

## 2024-07-12 ENCOUNTER — Emergency Department (HOSPITAL_BASED_OUTPATIENT_CLINIC_OR_DEPARTMENT_OTHER)
Admission: EM | Admit: 2024-07-12 | Discharge: 2024-07-12 | Disposition: A | Discharge status: Home / Self Care | Attending: Emergency Medicine | Admitting: Emergency Medicine

## 2024-07-12 ENCOUNTER — Encounter: Payer: Self-pay | Admitting: Internal Medicine

## 2024-07-12 DIAGNOSIS — W228XXA Striking against or struck by other objects, initial encounter: Secondary | ICD-10-CM | POA: Insufficient documentation

## 2024-07-12 DIAGNOSIS — S0990XA Unspecified injury of head, initial encounter: Secondary | ICD-10-CM | POA: Insufficient documentation

## 2024-07-12 DIAGNOSIS — I4891 Unspecified atrial fibrillation: Secondary | ICD-10-CM | POA: Diagnosis not present

## 2024-07-12 DIAGNOSIS — Z91040 Latex allergy status: Secondary | ICD-10-CM | POA: Diagnosis not present

## 2024-07-12 DIAGNOSIS — Z7901 Long term (current) use of anticoagulants: Secondary | ICD-10-CM | POA: Diagnosis not present

## 2024-07-12 NOTE — ED Provider Notes (Signed)
 " [place] EMERGENCY DEPARTMENT AT [facility] Provider Note   CSN: [id number] Arrival date & time: 07/12/24  1309     Patient presents with: Kailash Hinze is a 79 y.o. male patient with history of atrial fibrillation on Eliquis  who presents to the emergency department today for further evaluation of a head injury that occurred yesterday. Patient was working inside of a cooler that holds ice in the back of his vehicle when the lid of the cooler struck him in the head.  He did have a severe headache initially but that has since been improving.  Does have a slight headache here but improved from yesterday.  Denies any focal weakness, focal numbness, syncope, or any other complaints at this time.    Fall       Prior to Admission medications  Medication Sig Start Date End Date Taking? Authorizing Provider  albuterol  (VENTOLIN  HFA) 108 (90 Base) MCG/ACT inhaler Inhale 2 puffs into the lungs every 6 (six) hours as needed for wheezing or shortness of breath. 12/21/23   Norleen Lynwood ORN, MD  apixaban  (ELIQUIS ) 5 MG TABS tablet Take 1 tablet (5 mg total) by mouth 2 (two) times daily. 05/30/24   Rollene Almarie LABOR, MD  CALCIUM -MAGNESIUM-ZINC PO Take 1 tablet by mouth 2 (two) times daily.     [provider]  cephALEXin  (KEFLEX ) 500 MG capsule Take 1 capsule (500 mg total) by mouth 3 (three) times daily. 07/03/24   Gershon Donnice SAUNDERS, DPM  cholecalciferol (VITAMIN D ) 1000 UNITS tablet Take 1,000 Units by mouth daily.     [provider]  clobetasol  cream (TEMOVATE ) 0.05 % Apply 1 Application topically 2 (two) times daily. To scaly areas on back, abdomen and left arm for 2 weeks then stop. Take a 2 week break before restarting. 06/07/23   Alm Delon SAILOR, DO  Clobetasol  Propionate (TEMOVATE ) 0.05 % external spray Apply topically 2 (two) times daily. Patient not taking: Reported on 07/03/2024 10/27/21   Livingston Rigg, MD  [facility] Liver Oil CAPS Take 1 capsule by mouth  every morning.    [provider]  cyclobenzaprine  (FLEXERIL ) 10 MG tablet TAKE 1 TABLET BY MOUTH AT  BEDTIME 02/22/24   Rollene Almarie LABOR, MD  famotidine  (PEPCID ) 20 MG tablet TAKE 1 TABLET BY MOUTH DAILY 02/22/24   Rollene Almarie LABOR, MD  fluticasone  (FLONASE ) 50 MCG/ACT nasal spray Place 2 sprays into both nostrils daily. 03/02/23   Rollene Almarie LABOR, MD  hydrochlorothiazide  (MICROZIDE ) 12.5 MG capsule Take 1 capsule (12.5 mg total) by mouth daily. 01/24/24   Pietro Redell RAMAN, MD  hydroxychloroquine  (PLAQUENIL ) 200 MG tablet TAKE 1 TABLET BY MOUTH TWICE  DAILY 05/30/24   Dolphus Reiter, MD  lisinopril  (ZESTRIL ) 2.5 MG tablet Take 1 tablet (2.5 mg total) by mouth daily. 01/12/24 07/03/24  Pietro Redell RAMAN, MD  Lutein-Zeaxanthin 25-5 MG CAPS Take 1 capsule by mouth every morning.    [provider]  misoprostol  (CYTOTEC ) 100 MCG tablet TAKE 1 TABLET BY MOUTH TWICE  DAILY AS NEEDED 06/21/24   Rollene Almarie LABOR, MD  Multiple Vitamin (MULTIVITAMIN WITH MINERALS) TABS tablet Take 1 tablet by mouth daily.    [provider]  Roflumilast  (ZORYVE ) 0.3 % CREA Apply to affected areas of the body BID. 01/18/24   Alm Delon SAILOR, DO  rosuvastatin  (CRESTOR ) 40 MG tablet TAKE 1 TABLET BY MOUTH DAILY 02/24/24   Pietro Redell RAMAN, MD  triamcinolone  cream (KENALOG ) 0.1 % Apply 1 application  topically daily. Apply to psoriasis rash 06/20/19   Gershon Donnice SAUNDERS, DPM  vitamin B-12 (CYANOCOBALAMIN ) 1000 MCG tablet Take 1,000 mcg by mouth daily.    [provider]    Allergies: Azithromycin, Latex, and Prednisone    Review of Systems  All other systems reviewed and are negative.   Updated Vital Signs BP 120/77 (BP Location: Right Arm)   Pulse 75   Temp 98 F (36.7 C) (Oral)   Resp 18   SpO2 98%   Physical Exam Vitals and nursing note reviewed.  Constitutional:      Appearance: Normal appearance.  HENT:     Head: Normocephalic and atraumatic.  Eyes:      General:        Right eye: No discharge.        Left eye: No discharge.     Conjunctiva/sclera: Conjunctivae normal.  Pulmonary:     Effort: Pulmonary effort is normal.  Skin:    General: Skin is warm and dry.     Findings: No rash.  Neurological:     General: No focal deficit present.     Mental Status: He is alert and oriented to person, place, and time.     GCS: GCS eye subscore is 4. GCS verbal subscore is 5. GCS motor subscore is 6.     Cranial Nerves: Cranial nerves 2-12 are intact.     Sensory: Sensation is intact.     Motor: Motor function is intact.     Coordination: Coordination is intact.  Psychiatric:        Mood and Affect: Mood normal.        Behavior: Behavior normal.     (all labs ordered are listed, but only abnormal results are displayed) Labs Reviewed - No data to display  EKG: None  Radiology: CT Head Wo Contrast Result Date: 07/12/2024 CLINICAL DATA:  Mild headache. EXAM: CT HEAD WITHOUT CONTRAST TECHNIQUE: Contiguous axial images were obtained from the base of the skull through the vertex without intravenous contrast. RADIATION DOSE REDUCTION: This exam was performed according to the departmental dose-optimization program which includes automated exposure control, adjustment of the mA and/or kV according to patient size and/or use of iterative reconstruction technique. COMPARISON:  April 28, 2024 FINDINGS: Brain: There is mild cerebral atrophy with widening of the extra-axial spaces and ventricular dilatation. There are areas of decreased attenuation within the white matter tracts of the supratentorial brain, consistent with microvascular disease changes. Vascular: No hyperdense vessel or unexpected calcification. Skull: Normal. Negative for fracture or focal lesion. Sinuses/Orbits: No acute finding. Other: None. IMPRESSION: No acute intracranial abnormality. Electronically Signed   By: Suzen Dials M.D.   On: 07/12/2024 16:36     Procedures    Medications Ordered in the ED - No data to display   Medical Decision Making Porter Moes is a 79 y.o. male patient who presents to the emergency department today for further evaluation of a head injury.  CT scan which was ordered in triage interpret by myself is negative for any intracranial hemorrhage.  This is likely just mild concussion.  We discussed strict return precautions.  He will follow-up with his primary care doctor.  He is safe for discharge.   Amount and/or Complexity of Data Reviewed Radiology: ordered.     Final diagnoses:  Injury of head, initial encounter    ED Discharge Orders     None          Theotis Cameron HERO, PA-C  07/12/24 1759    Dreama Longs, MD 07/13/24 1218  "

## 2024-07-12 NOTE — Discharge Instructions (Addendum)
 As we discussed, no evidence of bleeding in the brain today which is great news.  Would like you to follow-up with your primary care doctor for further evaluation.  You may return to the emergency department for any worsening symptoms.

## 2024-07-12 NOTE — ED Triage Notes (Addendum)
 Pt reports a cooler lid closing on/hitting his posterior upper skull while he reached in for something yesterday while it was in the trunk of his SUV.  Pt reports mild headache and feeling off but denies any pain at site or palpable swelling.  Pt takes eliquis .  No focal deficits noted in triage.

## 2024-07-18 ENCOUNTER — Encounter (HOSPITAL_BASED_OUTPATIENT_CLINIC_OR_DEPARTMENT_OTHER): Payer: Self-pay | Admitting: Orthopaedic Surgery

## 2024-07-19 ENCOUNTER — Other Ambulatory Visit (HOSPITAL_BASED_OUTPATIENT_CLINIC_OR_DEPARTMENT_OTHER): Payer: Self-pay | Admitting: Orthopaedic Surgery

## 2024-07-19 ENCOUNTER — Ambulatory Visit: Admitting: Dermatology

## 2024-07-19 DIAGNOSIS — G8929 Other chronic pain: Secondary | ICD-10-CM

## 2024-07-21 ENCOUNTER — Ambulatory Visit (HOSPITAL_BASED_OUTPATIENT_CLINIC_OR_DEPARTMENT_OTHER): Admitting: Orthopaedic Surgery

## 2024-07-21 ENCOUNTER — Telehealth: Payer: Self-pay | Admitting: Cardiology

## 2024-07-21 ENCOUNTER — Other Ambulatory Visit: Payer: Self-pay | Admitting: Internal Medicine

## 2024-07-21 NOTE — Telephone Encounter (Signed)
" °*  STAT* If patient is at the pharmacy, call can be transferred to refill team.   1. Which medications need to be refilled? (please list name of each medication and dose if known) hydrochlorothiazide  (MICROZIDE ) 12.5 MG capsule    2. Would you like to learn more about the convenience, safety, & potential cost savings by using the [facility]? No    3. Are you open to using the [facility] (Type [facility] No    4. Which pharmacy/location (including street and city if local pharmacy) is medication to be sent to? [id number] [place], [place] - [address] [facility] [address] [facility] - [place], [place] - [address]    5. Do they need a 30 day or 90 day supply? 90   Pt needs a 7 day supply sent to [facility] until [facility] can deliver.   "

## 2024-07-25 ENCOUNTER — Other Ambulatory Visit: Payer: Self-pay

## 2024-07-25 MED ORDER — HYDROCHLOROTHIAZIDE 12.5 MG PO CAPS: 12.5000 mg | ORAL_CAPSULE | Freq: Every day | ORAL | 1 refills | Status: AC

## 2024-08-07 ENCOUNTER — Other Ambulatory Visit: Payer: Self-pay | Admitting: Cardiology

## 2024-08-14 ENCOUNTER — Ambulatory Visit: Admitting: Podiatry

## 2024-08-18 ENCOUNTER — Other Ambulatory Visit

## 2024-08-30 ENCOUNTER — Ambulatory Visit (HOSPITAL_BASED_OUTPATIENT_CLINIC_OR_DEPARTMENT_OTHER): Admitting: Orthopaedic Surgery

## 2024-09-25 ENCOUNTER — Ambulatory Visit: Admitting: Dermatology

## 2024-11-17 ENCOUNTER — Ambulatory Visit: Admitting: Rheumatology
